# Patient Record
Sex: Female | Born: 1960 | Race: White | Hispanic: No | Marital: Single | Smoking: Current every day smoker
Health system: Southern US, Community
[De-identification: ages and names within clinical notes are randomized; demographics above are authoritative.]

## PROBLEM LIST (undated history)

## (undated) DIAGNOSIS — I2510 Atherosclerotic heart disease of native coronary artery without angina pectoris: Secondary | ICD-10-CM

## (undated) DIAGNOSIS — I255 Ischemic cardiomyopathy: Secondary | ICD-10-CM

## (undated) DIAGNOSIS — F319 Bipolar disorder, unspecified: Secondary | ICD-10-CM

## (undated) DIAGNOSIS — I10 Essential (primary) hypertension: Secondary | ICD-10-CM

## (undated) DIAGNOSIS — J020 Streptococcal pharyngitis: Secondary | ICD-10-CM

## (undated) DIAGNOSIS — N393 Stress incontinence (female) (male): Secondary | ICD-10-CM

## (undated) DIAGNOSIS — N390 Urinary tract infection, site not specified: Secondary | ICD-10-CM

## (undated) DIAGNOSIS — L309 Dermatitis, unspecified: Secondary | ICD-10-CM

## (undated) DIAGNOSIS — Z720 Tobacco use: Secondary | ICD-10-CM

## (undated) DIAGNOSIS — D219 Benign neoplasm of connective and other soft tissue, unspecified: Secondary | ICD-10-CM

## (undated) DIAGNOSIS — J449 Chronic obstructive pulmonary disease, unspecified: Secondary | ICD-10-CM

## (undated) HISTORY — DX: Atherosclerotic heart disease of native coronary artery without angina pectoris: I25.10

## (undated) HISTORY — DX: Bipolar disorder, unspecified: F31.9

## (undated) HISTORY — DX: Dermatitis, unspecified: L30.9

## (undated) HISTORY — PX: CORONARY STENT PLACEMENT: SHX1402

## (undated) HISTORY — DX: Tobacco use: Z72.0

## (undated) HISTORY — DX: Benign neoplasm of connective and other soft tissue, unspecified: D21.9

## (undated) HISTORY — DX: Chronic obstructive pulmonary disease, unspecified: J44.9

## (undated) HISTORY — DX: Essential (primary) hypertension: I10

## (undated) HISTORY — DX: Stress incontinence (female) (male): N39.3

## (undated) HISTORY — DX: Urinary tract infection, site not specified: N39.0

## (undated) HISTORY — PX: APPENDECTOMY: SHX54

## (undated) HISTORY — DX: Ischemic cardiomyopathy: I25.5

## (undated) HISTORY — PX: TUBAL LIGATION: SHX77

---
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
## 2000-07-19 ENCOUNTER — Other Ambulatory Visit: Admission: RE | Admit: 2000-07-19 | Discharge: 2000-07-19 | Payer: Self-pay | Admitting: Obstetrics and Gynecology

## 2000-09-24 ENCOUNTER — Emergency Department (HOSPITAL_COMMUNITY): Admission: EM | Admit: 2000-09-24 | Discharge: 2000-09-24 | Payer: Self-pay | Admitting: Emergency Medicine

## 2000-10-01 ENCOUNTER — Emergency Department (HOSPITAL_COMMUNITY): Admission: EM | Admit: 2000-10-01 | Discharge: 2000-10-01 | Payer: Self-pay | Admitting: *Deleted

## 2001-03-11 ENCOUNTER — Ambulatory Visit (HOSPITAL_COMMUNITY): Admission: RE | Admit: 2001-03-11 | Discharge: 2001-03-11 | Payer: Self-pay | Admitting: Family Medicine

## 2001-03-11 ENCOUNTER — Encounter: Payer: Self-pay | Admitting: Family Medicine

## 2002-03-11 ENCOUNTER — Emergency Department (HOSPITAL_COMMUNITY): Admission: EM | Admit: 2002-03-11 | Discharge: 2002-03-11 | Payer: Self-pay | Admitting: *Deleted

## 2002-04-06 ENCOUNTER — Emergency Department (HOSPITAL_COMMUNITY): Admission: EM | Admit: 2002-04-06 | Discharge: 2002-04-06 | Payer: Self-pay | Admitting: Emergency Medicine

## 2003-02-12 ENCOUNTER — Emergency Department (HOSPITAL_COMMUNITY): Admission: EM | Admit: 2003-02-12 | Discharge: 2003-02-12 | Payer: Self-pay | Admitting: Emergency Medicine

## 2003-08-09 ENCOUNTER — Emergency Department (HOSPITAL_COMMUNITY): Admission: EM | Admit: 2003-08-09 | Discharge: 2003-08-09 | Payer: Self-pay | Admitting: Emergency Medicine

## 2004-07-17 ENCOUNTER — Emergency Department (HOSPITAL_COMMUNITY): Admission: EM | Admit: 2004-07-17 | Discharge: 2004-07-17 | Payer: Self-pay | Admitting: Emergency Medicine

## 2004-08-06 ENCOUNTER — Emergency Department (HOSPITAL_COMMUNITY): Admission: EM | Admit: 2004-08-06 | Discharge: 2004-08-06 | Payer: Self-pay | Admitting: Emergency Medicine

## 2005-02-05 ENCOUNTER — Encounter (INDEPENDENT_AMBULATORY_CARE_PROVIDER_SITE_OTHER): Payer: Self-pay | Admitting: Internal Medicine

## 2005-07-20 ENCOUNTER — Emergency Department (HOSPITAL_COMMUNITY): Admission: EM | Admit: 2005-07-20 | Discharge: 2005-07-20 | Payer: Self-pay | Admitting: Internal Medicine

## 2005-08-24 ENCOUNTER — Emergency Department (HOSPITAL_COMMUNITY): Admission: EM | Admit: 2005-08-24 | Discharge: 2005-08-24 | Payer: Self-pay | Admitting: Emergency Medicine

## 2005-11-16 ENCOUNTER — Ambulatory Visit: Payer: Self-pay | Admitting: Internal Medicine

## 2005-11-26 ENCOUNTER — Ambulatory Visit: Payer: Self-pay | Admitting: Family Medicine

## 2005-11-30 ENCOUNTER — Ambulatory Visit: Payer: Self-pay | Admitting: *Deleted

## 2005-12-11 ENCOUNTER — Ambulatory Visit: Payer: Self-pay | Admitting: Internal Medicine

## 2006-03-20 ENCOUNTER — Ambulatory Visit: Payer: Self-pay | Admitting: Family Medicine

## 2006-04-25 ENCOUNTER — Ambulatory Visit: Payer: Self-pay | Admitting: Internal Medicine

## 2006-05-07 ENCOUNTER — Ambulatory Visit: Payer: Self-pay | Admitting: Internal Medicine

## 2006-05-23 ENCOUNTER — Ambulatory Visit: Payer: Self-pay | Admitting: Internal Medicine

## 2006-09-03 ENCOUNTER — Ambulatory Visit: Payer: Self-pay | Admitting: Internal Medicine

## 2006-09-03 LAB — CONVERTED CEMR LAB
Cholesterol: 153 mg/dL (ref 0–200)
HDL: 31 mg/dL — ABNORMAL LOW (ref >39)
Total CHOL/HDL Ratio: 4.9
VLDL: 49 mg/dL — ABNORMAL HIGH (ref 0–40)

## 2006-09-05 ENCOUNTER — Ambulatory Visit (HOSPITAL_COMMUNITY): Admission: RE | Admit: 2006-09-05 | Discharge: 2006-09-05 | Payer: Self-pay | Admitting: Internal Medicine

## 2006-09-10 ENCOUNTER — Ambulatory Visit (HOSPITAL_COMMUNITY): Admission: RE | Admit: 2006-09-10 | Discharge: 2006-09-10 | Payer: Self-pay | Admitting: Family Medicine

## 2006-09-17 ENCOUNTER — Encounter (INDEPENDENT_AMBULATORY_CARE_PROVIDER_SITE_OTHER): Payer: Self-pay | Admitting: Internal Medicine

## 2006-09-17 DIAGNOSIS — F3130 Bipolar disorder, current episode depressed, mild or moderate severity, unspecified: Secondary | ICD-10-CM | POA: Insufficient documentation

## 2006-10-21 ENCOUNTER — Telehealth (INDEPENDENT_AMBULATORY_CARE_PROVIDER_SITE_OTHER): Payer: Self-pay | Admitting: Internal Medicine

## 2006-10-23 ENCOUNTER — Encounter (INDEPENDENT_AMBULATORY_CARE_PROVIDER_SITE_OTHER): Payer: Self-pay | Admitting: *Deleted

## 2007-01-21 ENCOUNTER — Telehealth (INDEPENDENT_AMBULATORY_CARE_PROVIDER_SITE_OTHER): Payer: Self-pay | Admitting: Internal Medicine

## 2007-04-25 ENCOUNTER — Ambulatory Visit: Payer: Self-pay | Admitting: Internal Medicine

## 2007-04-25 DIAGNOSIS — F17200 Nicotine dependence, unspecified, uncomplicated: Secondary | ICD-10-CM | POA: Insufficient documentation

## 2007-04-25 DIAGNOSIS — J309 Allergic rhinitis, unspecified: Secondary | ICD-10-CM | POA: Insufficient documentation

## 2007-04-25 DIAGNOSIS — J40 Bronchitis, not specified as acute or chronic: Secondary | ICD-10-CM | POA: Insufficient documentation

## 2007-06-05 ENCOUNTER — Ambulatory Visit: Payer: Self-pay | Admitting: Internal Medicine

## 2007-06-12 ENCOUNTER — Encounter (INDEPENDENT_AMBULATORY_CARE_PROVIDER_SITE_OTHER): Payer: Self-pay | Admitting: Internal Medicine

## 2007-09-11 ENCOUNTER — Emergency Department (HOSPITAL_COMMUNITY): Admission: EM | Admit: 2007-09-11 | Discharge: 2007-09-11 | Payer: Self-pay | Admitting: Emergency Medicine

## 2007-09-11 DIAGNOSIS — J189 Pneumonia, unspecified organism: Secondary | ICD-10-CM | POA: Insufficient documentation

## 2007-09-11 DIAGNOSIS — J438 Other emphysema: Secondary | ICD-10-CM | POA: Insufficient documentation

## 2007-09-18 ENCOUNTER — Ambulatory Visit: Payer: Self-pay | Admitting: Nurse Practitioner

## 2007-09-18 DIAGNOSIS — M25519 Pain in unspecified shoulder: Secondary | ICD-10-CM | POA: Insufficient documentation

## 2007-10-30 ENCOUNTER — Ambulatory Visit: Payer: Self-pay | Admitting: Internal Medicine

## 2007-10-30 DIAGNOSIS — G5600 Carpal tunnel syndrome, unspecified upper limb: Secondary | ICD-10-CM | POA: Insufficient documentation

## 2007-11-14 ENCOUNTER — Ambulatory Visit (HOSPITAL_COMMUNITY): Admission: RE | Admit: 2007-11-14 | Discharge: 2007-11-14 | Payer: Self-pay | Admitting: Internal Medicine

## 2007-11-21 ENCOUNTER — Telehealth (INDEPENDENT_AMBULATORY_CARE_PROVIDER_SITE_OTHER): Payer: Self-pay | Admitting: Internal Medicine

## 2007-11-30 ENCOUNTER — Encounter (INDEPENDENT_AMBULATORY_CARE_PROVIDER_SITE_OTHER): Payer: Self-pay | Admitting: Internal Medicine

## 2008-01-16 ENCOUNTER — Telehealth (INDEPENDENT_AMBULATORY_CARE_PROVIDER_SITE_OTHER): Payer: Self-pay | Admitting: Internal Medicine

## 2008-02-05 ENCOUNTER — Ambulatory Visit: Payer: Self-pay | Admitting: Internal Medicine

## 2008-02-05 DIAGNOSIS — J0100 Acute maxillary sinusitis, unspecified: Secondary | ICD-10-CM | POA: Insufficient documentation

## 2008-03-01 ENCOUNTER — Telehealth (INDEPENDENT_AMBULATORY_CARE_PROVIDER_SITE_OTHER): Payer: Self-pay | Admitting: Internal Medicine

## 2008-03-04 ENCOUNTER — Ambulatory Visit: Payer: Self-pay | Admitting: Internal Medicine

## 2008-03-04 DIAGNOSIS — R32 Unspecified urinary incontinence: Secondary | ICD-10-CM | POA: Insufficient documentation

## 2008-03-04 LAB — CONVERTED CEMR LAB
Blood in Urine, dipstick: NEGATIVE
Nitrite: NEGATIVE
Protein, U semiquant: NEGATIVE
WBC Urine, dipstick: NEGATIVE

## 2008-04-05 DIAGNOSIS — F339 Major depressive disorder, recurrent, unspecified: Secondary | ICD-10-CM | POA: Insufficient documentation

## 2008-04-14 ENCOUNTER — Telehealth (INDEPENDENT_AMBULATORY_CARE_PROVIDER_SITE_OTHER): Payer: Self-pay | Admitting: Internal Medicine

## 2008-04-23 ENCOUNTER — Encounter (INDEPENDENT_AMBULATORY_CARE_PROVIDER_SITE_OTHER): Payer: Self-pay | Admitting: *Deleted

## 2008-08-24 ENCOUNTER — Ambulatory Visit (HOSPITAL_COMMUNITY): Admission: RE | Admit: 2008-08-24 | Discharge: 2008-08-24 | Payer: Self-pay | Admitting: Family Medicine

## 2008-08-31 ENCOUNTER — Encounter (INDEPENDENT_AMBULATORY_CARE_PROVIDER_SITE_OTHER): Payer: Self-pay | Admitting: Internal Medicine

## 2008-10-05 ENCOUNTER — Inpatient Hospital Stay (HOSPITAL_COMMUNITY): Admission: RE | Admit: 2008-10-05 | Discharge: 2008-10-08 | Payer: Self-pay | Admitting: Cardiovascular Disease

## 2008-10-05 ENCOUNTER — Encounter: Payer: Self-pay | Admitting: Emergency Medicine

## 2008-10-09 ENCOUNTER — Emergency Department (HOSPITAL_COMMUNITY): Admission: EM | Admit: 2008-10-09 | Discharge: 2008-10-09 | Payer: Self-pay | Admitting: Emergency Medicine

## 2008-10-25 ENCOUNTER — Encounter (HOSPITAL_COMMUNITY): Admission: RE | Admit: 2008-10-25 | Discharge: 2008-11-03 | Payer: Self-pay | Admitting: Family Medicine

## 2008-11-05 ENCOUNTER — Encounter (HOSPITAL_COMMUNITY): Admission: RE | Admit: 2008-11-05 | Discharge: 2008-12-05 | Payer: Self-pay | Admitting: Cardiovascular Disease

## 2008-11-15 ENCOUNTER — Encounter (INDEPENDENT_AMBULATORY_CARE_PROVIDER_SITE_OTHER): Payer: Self-pay | Admitting: Internal Medicine

## 2008-12-08 ENCOUNTER — Ambulatory Visit (HOSPITAL_COMMUNITY): Admission: RE | Admit: 2008-12-08 | Discharge: 2008-12-08 | Payer: Self-pay | Admitting: Family Medicine

## 2008-12-08 LAB — HM MAMMOGRAPHY: HM Mammogram: NORMAL

## 2008-12-13 ENCOUNTER — Encounter (INDEPENDENT_AMBULATORY_CARE_PROVIDER_SITE_OTHER): Payer: Self-pay | Admitting: Internal Medicine

## 2009-05-26 ENCOUNTER — Emergency Department (HOSPITAL_COMMUNITY): Admission: EM | Admit: 2009-05-26 | Discharge: 2009-05-27 | Payer: Self-pay | Admitting: Emergency Medicine

## 2009-10-09 ENCOUNTER — Emergency Department (HOSPITAL_COMMUNITY): Admission: EM | Admit: 2009-10-09 | Discharge: 2009-10-09 | Payer: Self-pay | Admitting: Emergency Medicine

## 2010-02-26 ENCOUNTER — Encounter: Payer: Self-pay | Admitting: Family Medicine

## 2010-02-26 ENCOUNTER — Encounter: Payer: Self-pay | Admitting: Occupational Therapy

## 2010-03-24 ENCOUNTER — Emergency Department (HOSPITAL_COMMUNITY)
Admission: EM | Admit: 2010-03-24 | Discharge: 2010-03-25 | Disposition: A | Discharge status: Home / Self Care | Payer: Self-pay | Attending: Emergency Medicine | Admitting: Emergency Medicine

## 2010-03-24 ENCOUNTER — Emergency Department (HOSPITAL_COMMUNITY): Payer: Self-pay

## 2010-03-24 DIAGNOSIS — R05 Cough: Secondary | ICD-10-CM | POA: Insufficient documentation

## 2010-03-24 DIAGNOSIS — M129 Arthropathy, unspecified: Secondary | ICD-10-CM | POA: Insufficient documentation

## 2010-03-24 DIAGNOSIS — F3289 Other specified depressive episodes: Secondary | ICD-10-CM | POA: Insufficient documentation

## 2010-03-24 DIAGNOSIS — I2510 Atherosclerotic heart disease of native coronary artery without angina pectoris: Secondary | ICD-10-CM | POA: Insufficient documentation

## 2010-03-24 DIAGNOSIS — R059 Cough, unspecified: Secondary | ICD-10-CM | POA: Insufficient documentation

## 2010-03-24 DIAGNOSIS — Z9861 Coronary angioplasty status: Secondary | ICD-10-CM | POA: Insufficient documentation

## 2010-03-24 DIAGNOSIS — Z79899 Other long term (current) drug therapy: Secondary | ICD-10-CM | POA: Insufficient documentation

## 2010-03-24 DIAGNOSIS — J45909 Unspecified asthma, uncomplicated: Secondary | ICD-10-CM | POA: Insufficient documentation

## 2010-03-24 DIAGNOSIS — F329 Major depressive disorder, single episode, unspecified: Secondary | ICD-10-CM | POA: Insufficient documentation

## 2010-03-24 DIAGNOSIS — R0602 Shortness of breath: Secondary | ICD-10-CM | POA: Insufficient documentation

## 2010-03-24 DIAGNOSIS — F17200 Nicotine dependence, unspecified, uncomplicated: Secondary | ICD-10-CM | POA: Insufficient documentation

## 2010-04-25 LAB — DIFFERENTIAL
Eosinophils Absolute: 0.1 10*3/uL (ref 0.0–0.7)
Eosinophils Relative: 2 % (ref 0–5)
Lymphs Abs: 1.6 10*3/uL (ref 0.7–4.0)
Monocytes Relative: 8 % (ref 3–12)

## 2010-04-25 LAB — URINALYSIS, ROUTINE W REFLEX MICROSCOPIC
Bilirubin Urine: NEGATIVE
Glucose, UA: NEGATIVE mg/dL
Hgb urine dipstick: NEGATIVE
Ketones, ur: NEGATIVE mg/dL
Protein, ur: NEGATIVE mg/dL

## 2010-04-25 LAB — COMPREHENSIVE METABOLIC PANEL
ALT: 9 U/L (ref 0–35)
AST: 10 U/L (ref 0–37)
CO2: 29 mEq/L (ref 19–32)
Calcium: 8.8 mg/dL (ref 8.4–10.5)
Chloride: 107 mEq/L (ref 96–112)
GFR calc Af Amer: >60 mL/min (ref >60)
GFR calc non Af Amer: >60 mL/min (ref >60)
Sodium: 138 mEq/L (ref 135–145)

## 2010-04-25 LAB — CBC
MCHC: 36.3 g/dL — ABNORMAL HIGH (ref 30.0–36.0)
RBC: 3.5 MIL/uL — ABNORMAL LOW (ref 3.87–5.11)
WBC: 5.9 10*3/uL (ref 4.0–10.5)

## 2010-04-25 LAB — WET PREP, GENITAL: Trich, Wet Prep: NONE SEEN

## 2010-04-25 LAB — LIPASE, BLOOD: Lipase: 40 U/L (ref 11–59)

## 2010-05-12 LAB — CBC
HCT: 34.2 % — ABNORMAL LOW (ref 36.0–46.0)
HCT: 36.6 % (ref 36.0–46.0)
Hemoglobin: 12.7 g/dL (ref 12.0–15.0)
MCHC: 35.4 g/dL (ref 30.0–36.0)
MCHC: 34.5 g/dL (ref 30.0–36.0)
MCV: 97.9 fL (ref 78.0–100.0)
MCV: 98.4 fL (ref 78.0–100.0)
Platelets: 261 10*3/uL (ref 150–400)
RBC: 3.72 MIL/uL — ABNORMAL LOW (ref 3.87–5.11)
RDW: 13.8 % (ref 11.5–15.5)

## 2010-05-12 LAB — COMPREHENSIVE METABOLIC PANEL
Albumin: 3.4 g/dL — ABNORMAL LOW (ref 3.5–5.2)
BUN: 6 mg/dL (ref 6–23)
Calcium: 8.8 mg/dL (ref 8.4–10.5)
Creatinine, Ser: 0.75 mg/dL (ref 0.4–1.2)
Total Protein: 6.4 g/dL (ref 6.0–8.3)

## 2010-05-12 LAB — POCT CARDIAC MARKERS
CKMB, poc: 1.7 ng/mL (ref 1.0–8.0)
CKMB, poc: 2.3 ng/mL (ref 1.0–8.0)
Myoglobin, poc: 159 ng/mL (ref 12–200)
Myoglobin, poc: 69.9 ng/mL (ref 12–200)
Troponin i, poc: 0.14 ng/mL — ABNORMAL HIGH (ref 0.00–0.09)

## 2010-05-12 LAB — CK TOTAL AND CKMB (NOT AT ARMC)
CK, MB: 16.2 ng/mL — ABNORMAL HIGH (ref 0.3–4.0)
CK, MB: 18.1 ng/mL — ABNORMAL HIGH (ref 0.3–4.0)
Relative Index: 7.3 — ABNORMAL HIGH (ref 0.0–2.5)
Total CK: 249 U/L — ABNORMAL HIGH (ref 7–177)

## 2010-05-12 LAB — BASIC METABOLIC PANEL
CO2: 25 mEq/L (ref 19–32)
Calcium: 8.3 mg/dL — ABNORMAL LOW (ref 8.4–10.5)
Chloride: 105 mEq/L (ref 96–112)
GFR calc Af Amer: >60 mL/min (ref >60)
Glucose, Bld: 121 mg/dL — ABNORMAL HIGH (ref 70–99)
Potassium: 3.6 mEq/L (ref 3.5–5.1)
Sodium: 135 mEq/L (ref 135–145)

## 2010-05-12 LAB — DIFFERENTIAL
Lymphocytes Relative: 15 % (ref 12–46)
Monocytes Absolute: 0.6 10*3/uL (ref 0.1–1.0)
Monocytes Relative: 9 % (ref 3–12)
Neutro Abs: 5.4 10*3/uL (ref 1.7–7.7)

## 2010-05-13 LAB — CBC
MCHC: 34.7 g/dL (ref 30.0–36.0)
RBC: 4.07 MIL/uL (ref 3.87–5.11)
RDW: 14.2 % (ref 11.5–15.5)

## 2010-05-13 LAB — POCT CARDIAC MARKERS: Myoglobin, poc: 18.4 ng/mL (ref 12–200)

## 2010-05-13 LAB — BASIC METABOLIC PANEL
CO2: 25 mEq/L (ref 19–32)
Calcium: 9.6 mg/dL (ref 8.4–10.5)
Creatinine, Ser: 0.81 mg/dL (ref 0.4–1.2)
GFR calc Af Amer: >60 mL/min (ref >60)
GFR calc non Af Amer: >60 mL/min (ref >60)

## 2010-06-20 NOTE — Cardiovascular Report (Signed)
NAMEMARIETA, Melissa Garcia NO.:  [id number]   MEDICAL RECORD NO.:  [id number]          PATIENT TYPE:  INP   LOCATION:  2909                         FACILITY:  [facility]   PHYSICIAN:  Vesta Mixer, M.D. DATE OF BIRTH:  November 23, 1960   DATE OF PROCEDURE:  10/05/2008  DATE OF DISCHARGE:                            CARDIAC CATHETERIZATION   Melissa Garcia is a 50 year old female with no significant past medical  history.  She presented to the [facility] Emergency Room with episodes  of chest discomfort.  Her pain started around 3:30 this afternoon.  She  arrived at the emergency room around 5.  She was found to have acute ST-  segment elevation in the anterior leads and transfer to [facility] was  arranged.  She developed ventricular fibrillation and was defibrillated  twice.  She promptly resumed sinus rhythm and she immediately felt quite  a bit better.  She did have some nausea, which was treated with Zofran.   She was transferred to [facility] for urgent/emergent cath and  angioplasty.   The procedure was left heart catheterization with coronary angiography.  We also performed PTCA and stenting of the mid left anterior descending  artery and PTCA of the second diagonal branch.   The procedure was left heart catheterization with coronary angiography  with PTCA and stenting of the LAD.  The right femoral artery was easily  cannulated using modified Seldinger technique.   HEMODYNAMICS:  LV pressure is 112/70.  Aortic pressure was 100/64.   Angiography of left main.  The left main has no significant  irregularities.   The left anterior descending artery has mild-to-moderate irregularities  throughout.  The mid vessel has an 80% irregular stenosis right at the  second diagonal vessel.  It is assumed that this was the site of the  occlusion.  I suspect that the defibrillation broke up the thrombus  resulting in an open artery at that point.   The LAD courses around the  apex and supplies the inferoapical wall.   The first diagonal artery is a very small vessel.  The second diagonal  artery originates at the site of the stenosis and has a 90% stenosis at  its takeoff.   The left circumflex artery is a relatively small vessel and it has minor  luminal irregularities.  The ramus intermediate vessel is a very large  vessel and is essentially normal.   The right coronary artery is normal.  There are mild-to-moderate  irregularities between 20% and 30% throughout its course.  There is a  large conus branch.   Left ventriculogram was performed in the 30-RAO position.  It reveals  anterior, apical, and inferoapical akinesis.  The ejection fraction is  around 30%.   There is no significant mitral regurgitation.   PCI.  A Judkins left 4 guide (6-French) was placed up into the vessel.  Angiomax was given.  The ACT was 352.   Prowater angioplasty wire was positioned down into the distal left  anterior descending artery.  Predilatation was achieved using a 2.5 x 15-  mm apex balloon.  We inflated it up to 6 atmospheres for 18 seconds and  then 6 atmospheres for 14 seconds.  A second wire was positioned down  into the second diagonal artery to protect it during the stent  procedure.  At this point, a 2.75 x 23-mm Xience stent was positioned  across the stenosis.  It was deployed at 10 atmospheres for 31 seconds.   At this point, the diagonal artery wire was removed.   Poststent dilatation was achieved using a 3.0 x 20 mm noncompliant  Voyager.  Position in the distal aspect of the stent was inflated up to  12 atmospheres for 18 seconds.  It was then pulled back to the proximal  edge of the stent, inflated up to 14 atmospheres for 16 seconds.  This  gave [place] a very nice angiographic result.  There was some additional  compromise of the diagonal flows.  The second diagonal was rewired.  A  1.5 mm x 12-mm apex was positioned down across the stent strut out  into  the diagonal.  It was inflated up to 6 atmospheres for 23 seconds and  then the second inflation up to 8 atmospheres for 20 seconds.  This gave  [place] some improved flow.  There was still a tight stenosis in the diagonal  vessel, but there was TIMI grade 3 flow.   At this time, it was decided to stop the angioplasty.  We had a nice  open left anterior descending artery.  The second diagonal artery has  good flow, but it has residual stenosis.  This stenosis appears to be in  the muscular area of the LAD and it is unlikely that it will improve  with further inflations.   COMPLICATIONS:  None.   CONCLUSION:  1. Successful PTCA and stenting of the left anterior descending artery      for an anterior wall myocardial infarction.  2. Markedly reduced left ventricular systolic function with an      ejection fraction of around 30%.  She will need to be started on      ACE inhibitor and beta-blockers.  We will also give her some Lasix.      A Foley was also placed in the cath lab.      Vesta Mixer, M.D.  Electronically Signed     PJN/MEDQ  D:  10/05/2008  T:  10/06/2008  Job:  [id number]

## 2010-08-02 ENCOUNTER — Emergency Department (HOSPITAL_COMMUNITY)
Admission: EM | Admit: 2010-08-02 | Discharge: 2010-08-03 | Disposition: A | Discharge status: Home / Self Care | Payer: Self-pay | Attending: Emergency Medicine | Admitting: Emergency Medicine

## 2010-08-02 DIAGNOSIS — J45909 Unspecified asthma, uncomplicated: Secondary | ICD-10-CM | POA: Insufficient documentation

## 2010-08-02 DIAGNOSIS — H53149 Visual discomfort, unspecified: Secondary | ICD-10-CM | POA: Insufficient documentation

## 2010-08-02 DIAGNOSIS — I2510 Atherosclerotic heart disease of native coronary artery without angina pectoris: Secondary | ICD-10-CM | POA: Insufficient documentation

## 2010-08-02 DIAGNOSIS — R109 Unspecified abdominal pain: Secondary | ICD-10-CM | POA: Insufficient documentation

## 2010-08-02 DIAGNOSIS — R112 Nausea with vomiting, unspecified: Secondary | ICD-10-CM | POA: Insufficient documentation

## 2010-08-02 DIAGNOSIS — F329 Major depressive disorder, single episode, unspecified: Secondary | ICD-10-CM | POA: Insufficient documentation

## 2010-08-02 DIAGNOSIS — F3289 Other specified depressive episodes: Secondary | ICD-10-CM | POA: Insufficient documentation

## 2010-08-02 DIAGNOSIS — J438 Other emphysema: Secondary | ICD-10-CM | POA: Insufficient documentation

## 2010-08-02 DIAGNOSIS — Z7902 Long term (current) use of antithrombotics/antiplatelets: Secondary | ICD-10-CM | POA: Insufficient documentation

## 2010-08-02 DIAGNOSIS — R51 Headache: Secondary | ICD-10-CM | POA: Insufficient documentation

## 2010-08-02 DIAGNOSIS — Z9861 Coronary angioplasty status: Secondary | ICD-10-CM | POA: Insufficient documentation

## 2010-08-02 DIAGNOSIS — Z7982 Long term (current) use of aspirin: Secondary | ICD-10-CM | POA: Insufficient documentation

## 2010-08-02 DIAGNOSIS — E7800 Pure hypercholesterolemia, unspecified: Secondary | ICD-10-CM | POA: Insufficient documentation

## 2010-08-02 DIAGNOSIS — Z79899 Other long term (current) drug therapy: Secondary | ICD-10-CM | POA: Insufficient documentation

## 2010-09-14 IMAGING — CR DG CHEST 1V PORT
1 series · 1 of 1 positions shown · non-contrast
Comparison: 09/11/2007

CLINICAL DATA: Chest pain and shortness of breath.

PORTABLE CHEST - 1 VIEW

[view not recorded]
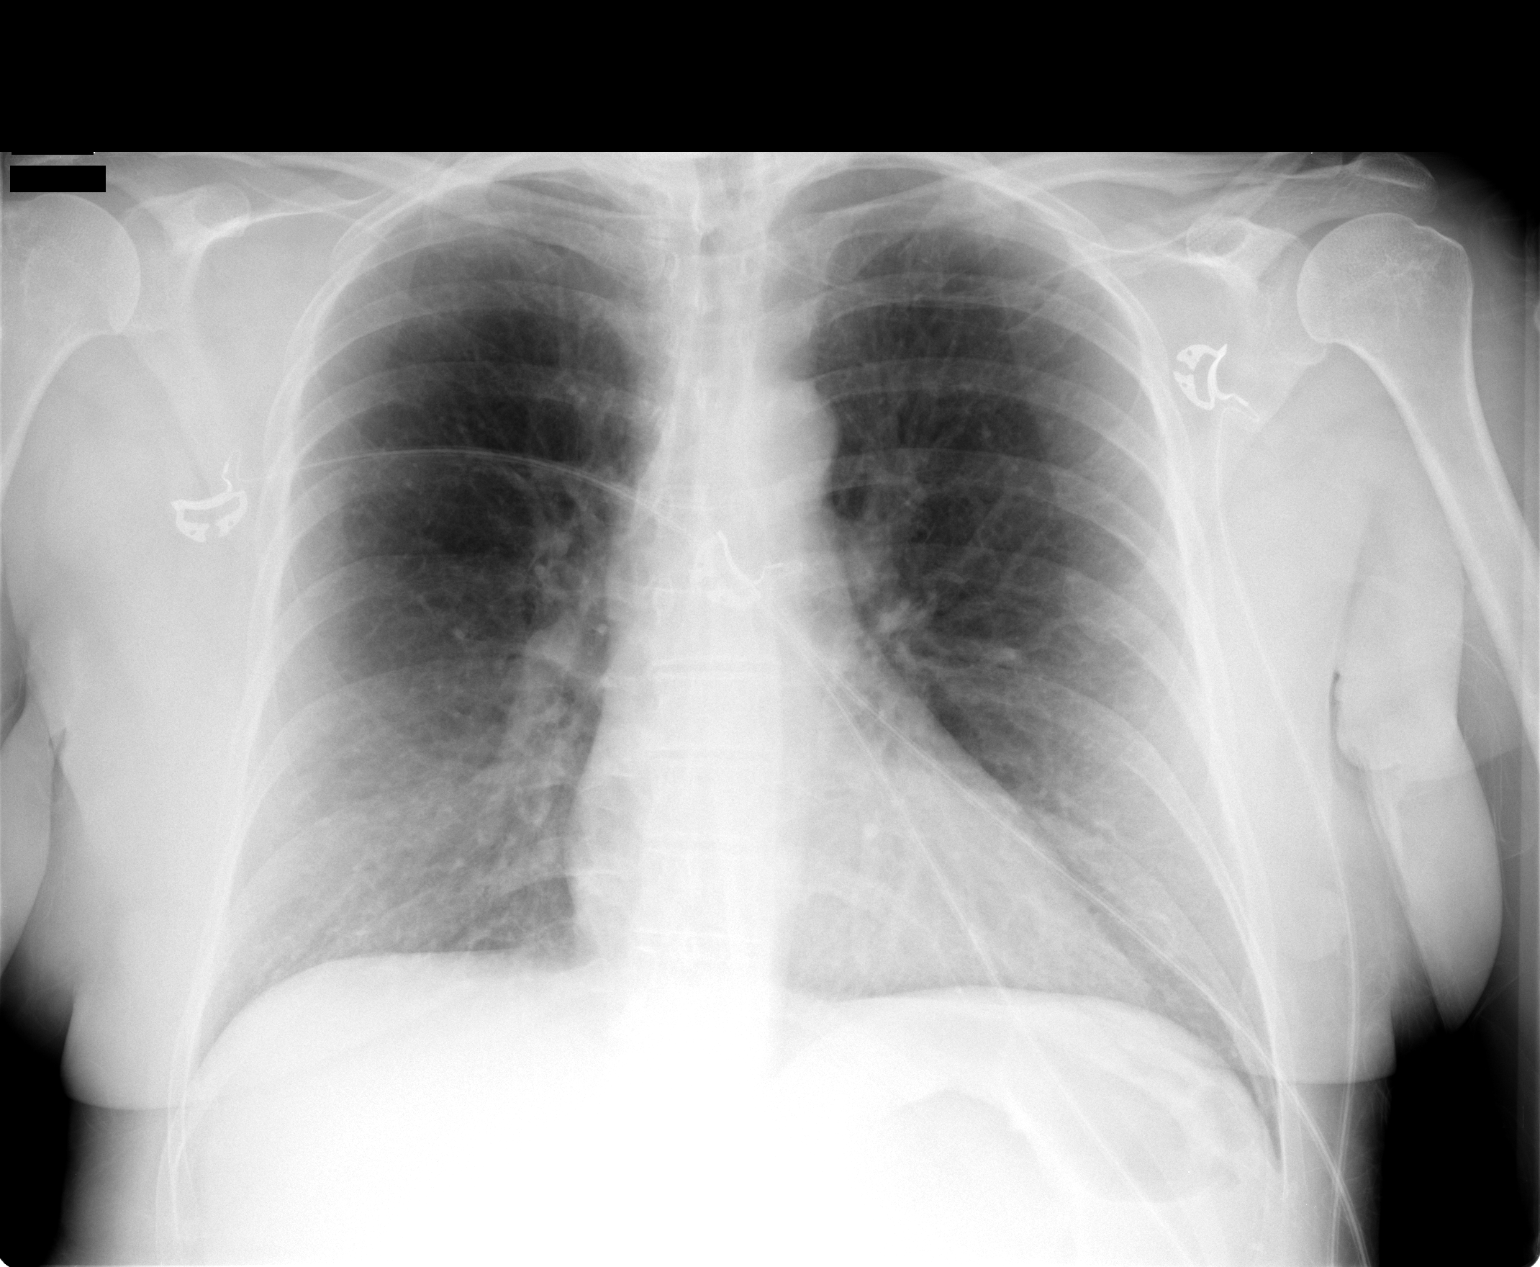

[1 of 1 positions shown; findings below may reference images not displayed]

FINDINGS: Stable chronic interstitial lung disease present.  No
evidence of edema, focal infiltrate, pneumothorax or pleural
effusion.  Heart size is normal.  The bony thorax is unremarkable.
IMPRESSION: No acute findings.  Stable chronic interstitial prominence.

## 2010-11-01 ENCOUNTER — Emergency Department (HOSPITAL_COMMUNITY): Payer: Medicaid Other

## 2010-11-01 ENCOUNTER — Other Ambulatory Visit: Payer: Self-pay

## 2010-11-01 ENCOUNTER — Inpatient Hospital Stay (HOSPITAL_COMMUNITY)
Admission: EM | Admit: 2010-11-01 | Discharge: 2010-11-02 | DRG: 191 | Disposition: A | Discharge status: Home / Self Care | Payer: Medicaid Other | Attending: Internal Medicine | Admitting: Internal Medicine

## 2010-11-01 ENCOUNTER — Encounter: Payer: Self-pay | Admitting: *Deleted

## 2010-11-01 DIAGNOSIS — J189 Pneumonia, unspecified organism: Secondary | ICD-10-CM

## 2010-11-01 DIAGNOSIS — M25519 Pain in unspecified shoulder: Secondary | ICD-10-CM

## 2010-11-01 DIAGNOSIS — I2510 Atherosclerotic heart disease of native coronary artery without angina pectoris: Secondary | ICD-10-CM | POA: Insufficient documentation

## 2010-11-01 DIAGNOSIS — Z9861 Coronary angioplasty status: Secondary | ICD-10-CM

## 2010-11-01 DIAGNOSIS — J40 Bronchitis, not specified as acute or chronic: Secondary | ICD-10-CM | POA: Diagnosis present

## 2010-11-01 DIAGNOSIS — J441 Chronic obstructive pulmonary disease with (acute) exacerbation: Principal | ICD-10-CM | POA: Diagnosis present

## 2010-11-01 DIAGNOSIS — Z9089 Acquired absence of other organs: Secondary | ICD-10-CM

## 2010-11-01 DIAGNOSIS — R0789 Other chest pain: Secondary | ICD-10-CM | POA: Diagnosis present

## 2010-11-01 DIAGNOSIS — J438 Other emphysema: Secondary | ICD-10-CM

## 2010-11-01 DIAGNOSIS — G5600 Carpal tunnel syndrome, unspecified upper limb: Secondary | ICD-10-CM

## 2010-11-01 DIAGNOSIS — F17200 Nicotine dependence, unspecified, uncomplicated: Secondary | ICD-10-CM | POA: Diagnosis present

## 2010-11-01 DIAGNOSIS — F339 Major depressive disorder, recurrent, unspecified: Secondary | ICD-10-CM

## 2010-11-01 DIAGNOSIS — F3130 Bipolar disorder, current episode depressed, mild or moderate severity, unspecified: Secondary | ICD-10-CM | POA: Diagnosis present

## 2010-11-01 DIAGNOSIS — J309 Allergic rhinitis, unspecified: Secondary | ICD-10-CM

## 2010-11-01 DIAGNOSIS — J0100 Acute maxillary sinusitis, unspecified: Secondary | ICD-10-CM

## 2010-11-01 DIAGNOSIS — R32 Unspecified urinary incontinence: Secondary | ICD-10-CM

## 2010-11-01 LAB — COMPREHENSIVE METABOLIC PANEL
AST: 12 U/L (ref 0–37)
Albumin: 3.8 g/dL (ref 3.5–5.2)
Calcium: 9.4 mg/dL (ref 8.4–10.5)
Creatinine, Ser: 0.67 mg/dL (ref 0.50–1.10)

## 2010-11-01 LAB — CBC
MCH: 33.9 pg (ref 26.0–34.0)
MCV: 98.2 fL (ref 78.0–100.0)
Platelets: 297 10*3/uL (ref 150–400)
RDW: 14 % (ref 11.5–15.5)
WBC: 5.5 10*3/uL (ref 4.0–10.5)

## 2010-11-01 LAB — CARDIAC PANEL(CRET KIN+CKTOT+MB+TROPI)
CK, MB: 2.2 ng/mL (ref 0.3–4.0)
CK, MB: 2.4 ng/mL (ref 0.3–4.0)
Relative Index: INVALID (ref 0.0–2.5)
Relative Index: INVALID (ref 0.0–2.5)
Total CK: 51 U/L (ref 7–177)
Total CK: 57 U/L (ref 7–177)

## 2010-11-01 MED ORDER — ALBUTEROL SULFATE (5 MG/ML) 0.5% IN NEBU: 2.5000 mg | INHALATION_SOLUTION | RESPIRATORY_TRACT | Status: DC | PRN

## 2010-11-01 MED ORDER — LAMOTRIGINE 100 MG PO TABS
100.0000 mg | ORAL_TABLET | Freq: Every day | ORAL | Status: DC
  Administered 2010-11-01 – 2010-11-02 (×2): 100 mg via ORAL
  Filled 2010-11-01 (×4): qty 1

## 2010-11-01 MED ORDER — METHYLPREDNISOLONE SODIUM SUCC 125 MG IJ SOLR
125.0000 mg | Freq: Once | INTRAMUSCULAR | Status: AC
  Administered 2010-11-01: 125 mg via INTRAVENOUS
  Filled 2010-11-01: qty 2

## 2010-11-01 MED ORDER — DEXTROSE 5 % IV SOLN
500.0000 mg | INTRAVENOUS | Status: DC
  Administered 2010-11-01: 500 mg via INTRAVENOUS
  Filled 2010-11-01: qty 500

## 2010-11-01 MED ORDER — AZITHROMYCIN 250 MG PO TABS
250.0000 mg | ORAL_TABLET | Freq: Every day | ORAL | Status: DC
  Administered 2010-11-02: 250 mg via ORAL
  Filled 2010-11-01: qty 1

## 2010-11-01 MED ORDER — ALUM & MAG HYDROXIDE-SIMETH 200-200-20 MG/5ML PO SUSP: 30.0000 mL | Freq: Four times a day (QID) | ORAL | Status: DC | PRN

## 2010-11-01 MED ORDER — CLONAZEPAM 0.5 MG PO TABS: 0.5000 mg | ORAL_TABLET | Freq: Two times a day (BID) | ORAL | Status: DC | PRN

## 2010-11-01 MED ORDER — ALBUTEROL SULFATE HFA 108 (90 BASE) MCG/ACT IN AERS: 1.0000 | INHALATION_SPRAY | Freq: Four times a day (QID) | RESPIRATORY_TRACT | Status: DC | PRN

## 2010-11-01 MED ORDER — PREDNISONE 10 MG PO TABS: 50.0000 mg | ORAL_TABLET | Freq: Every day | ORAL | Status: DC

## 2010-11-01 MED ORDER — ALBUTEROL SULFATE (5 MG/ML) 0.5% IN NEBU
2.5000 mg | INHALATION_SOLUTION | Freq: Four times a day (QID) | RESPIRATORY_TRACT | Status: DC
  Administered 2010-11-01 (×2): 2.5 mg via RESPIRATORY_TRACT
  Filled 2010-11-01 (×2): qty 0.5

## 2010-11-01 MED ORDER — ENOXAPARIN SODIUM 40 MG/0.4ML ~~LOC~~ SOLN
40.0000 mg | Freq: Every day | SUBCUTANEOUS | Status: DC
  Administered 2010-11-01 – 2010-11-02 (×2): 40 mg via SUBCUTANEOUS
  Filled 2010-11-01 (×2): qty 0.4

## 2010-11-01 MED ORDER — DOXYCYCLINE HYCLATE 100 MG PO CAPS: 100.0000 mg | ORAL_CAPSULE | Freq: Two times a day (BID) | ORAL | Status: DC

## 2010-11-01 MED ORDER — SODIUM CHLORIDE 0.9 % IN NEBU
INHALATION_SOLUTION | RESPIRATORY_TRACT | Status: AC
  Filled 2010-11-01: qty 3

## 2010-11-01 MED ORDER — ALBUTEROL SULFATE (5 MG/ML) 0.5% IN NEBU
2.5000 mg | INHALATION_SOLUTION | Freq: Four times a day (QID) | RESPIRATORY_TRACT | Status: DC
  Administered 2010-11-02 (×2): 2.5 mg via RESPIRATORY_TRACT
  Filled 2010-11-01 (×2): qty 0.5

## 2010-11-01 MED ORDER — NICOTINE 14 MG/24HR TD PT24
14.0000 mg | MEDICATED_PATCH | Freq: Every day | TRANSDERMAL | Status: DC
  Administered 2010-11-01 – 2010-11-02 (×2): 14 mg via TRANSDERMAL
  Filled 2010-11-01 (×2): qty 1

## 2010-11-01 MED ORDER — THERA M PLUS PO TABS
1.0000 | ORAL_TABLET | Freq: Every day | ORAL | Status: DC
  Administered 2010-11-01 – 2010-11-02 (×2): 1 via ORAL
  Filled 2010-11-01 (×2): qty 1

## 2010-11-01 MED ORDER — OMEGA-3-ACID ETHYL ESTERS 1 G PO CAPS
1.0000 g | ORAL_CAPSULE | Freq: Two times a day (BID) | ORAL | Status: DC
  Administered 2010-11-01 – 2010-11-02 (×3): 1 g via ORAL
  Filled 2010-11-01 (×3): qty 1

## 2010-11-01 MED ORDER — CLOPIDOGREL BISULFATE 75 MG PO TABS
75.0000 mg | ORAL_TABLET | Freq: Every day | ORAL | Status: DC
  Administered 2010-11-01 – 2010-11-02 (×2): 75 mg via ORAL
  Filled 2010-11-01 (×2): qty 1

## 2010-11-01 MED ORDER — OLMESARTAN MEDOXOMIL 20 MG PO TABS
10.0000 mg | ORAL_TABLET | Freq: Every day | ORAL | Status: DC
  Administered 2010-11-01 – 2010-11-02 (×2): 10 mg via ORAL
  Filled 2010-11-01 (×2): qty 1

## 2010-11-01 MED ORDER — AZITHROMYCIN 250 MG PO TABS: 250.0000 mg | ORAL_TABLET | Freq: Every day | ORAL | Status: DC

## 2010-11-01 MED ORDER — DEXTROSE 5 % IV SOLN
1.0000 g | Freq: Once | INTRAVENOUS | Status: AC
  Administered 2010-11-01: 1 g via INTRAVENOUS
  Filled 2010-11-01: qty 1

## 2010-11-01 MED ORDER — SODIUM CHLORIDE 0.9 % IJ SOLN: 3.0000 mL | INTRAMUSCULAR | Status: DC | PRN

## 2010-11-01 MED ORDER — ASPIRIN EC 81 MG PO TBEC
81.0000 mg | DELAYED_RELEASE_TABLET | Freq: Every day | ORAL | Status: DC
  Administered 2010-11-01 – 2010-11-02 (×2): 81 mg via ORAL
  Filled 2010-11-01 (×2): qty 1

## 2010-11-01 MED ORDER — SIMVASTATIN 10 MG PO TABS
10.0000 mg | ORAL_TABLET | Freq: Every day | ORAL | Status: DC
  Administered 2010-11-01: 10 mg via ORAL
  Filled 2010-11-01: qty 1

## 2010-11-01 MED ORDER — SODIUM CHLORIDE 0.9 % IJ SOLN
3.0000 mL | Freq: Two times a day (BID) | INTRAMUSCULAR | Status: DC
  Administered 2010-11-01 – 2010-11-02 (×3): 3 mL via INTRAVENOUS
  Filled 2010-11-01 (×3): qty 3

## 2010-11-01 MED ORDER — AZITHROMYCIN 250 MG PO TABS
500.0000 mg | ORAL_TABLET | Freq: Every day | ORAL | Status: AC
  Administered 2010-11-01: 500 mg via ORAL
  Filled 2010-11-01: qty 2

## 2010-11-01 MED ORDER — ALBUTEROL SULFATE (5 MG/ML) 0.5% IN NEBU
5.0000 mg | INHALATION_SOLUTION | RESPIRATORY_TRACT | Status: AC | PRN
  Administered 2010-11-01: 5 mg via RESPIRATORY_TRACT
  Filled 2010-11-01: qty 1

## 2010-11-01 NOTE — ED Notes (Signed)
Pt reports productive cough and chest wall pain x 2 weeks

## 2010-11-01 NOTE — ED Provider Notes (Signed)
History     CSN: [id number] Arrival date & time: 11/01/2010  1:31 AM  Chief Complaint  Patient presents with  . Cough    HPI  (Consider location/radiation/quality/duration/timing/severity/associated sxs/prior treatment)  Patient is a 50 y.o. female presenting with cough. The history is provided by the patient. No language interpreter was used.  Cough This is a new problem. The current episode started more than 1 week ago. The problem occurs constantly. The problem has not changed since onset.The cough is productive of sputum. There has been no fever. Associated symptoms include rhinorrhea, myalgias and wheezing. Pertinent negatives include no chest pain, no chills, no sweats, no weight loss, no ear pain, no headaches, no sore throat and no eye redness. She has tried nothing for the symptoms. The treatment provided no relief. She is a smoker. Her past medical history is significant for asthma.  No CP, No DOE, no n/v/d.  Some nasal congestion  Past Medical History  Diagnosis Date  . Coronary artery disease   . Asthma   . Myocardial infarct   . Hypertension     Past Surgical History  Procedure Date  . Appendectomy   . Coronary stent placement     No family history on file.  History  Substance Use Topics  . Smoking status: Current Some Day Smoker    Types: Cigarettes  . Smokeless tobacco: Not on file  . Alcohol Use: No    OB History    Grav Para Term Preterm Abortions TAB SAB Ect Mult Living                  Review of Systems  Review of Systems  Constitutional: Negative for chills, weight loss, diaphoresis and appetite change.  HENT: Positive for congestion, rhinorrhea and postnasal drip. Negative for ear pain, sore throat and facial swelling.   Eyes: Negative for discharge and redness.  Respiratory: Positive for cough and wheezing. Negative for chest tightness.   Cardiovascular: Negative for chest pain.  Gastrointestinal: Negative for abdominal distention.    Genitourinary: Negative for difficulty urinating and dyspareunia.  Musculoskeletal: Positive for myalgias. Negative for arthralgias.  Neurological: Negative for headaches.  Hematological: Negative.   Psychiatric/Behavioral: Negative.     Allergies  Sulfonamide derivatives  Home Medications   Current Outpatient Rx  Name Route Sig Dispense Refill  . ALBUTEROL SULFATE HFA 108 (90 BASE) MCG/ACT IN AERS Inhalation Inhale 2 puffs into the lungs every 6 (six) hours as needed.      . ASPIRIN 81 MG PO TABS Oral Take 81 mg by mouth daily.      Marland Kitchen CLONAZEPAM 0.5 MG PO TABS Oral Take 0.5 mg by mouth 2 (two) times daily as needed.      . CLOPIDOGREL BISULFATE 75 MG PO TABS Oral Take 75 mg by mouth daily.      Marland Kitchen LAMOTRIGINE 100 MG PO TABS Oral Take 100 mg by mouth daily.      Marland Kitchen PRAVASTATIN SODIUM 20 MG PO TABS Oral Take 20 mg by mouth daily.      Marland Kitchen VALSARTAN 80 MG PO TABS Oral Take 80 mg by mouth daily.        Physical Exam    BP 154/84  Pulse 74  Temp(Src) 97.7 F (36.5 C) (Oral)  Resp 18  Ht 5\' 3"  (1.6 m)  Wt 162 lb (73.483 kg)  BMI 28.70 kg/m2  SpO2 98%  Physical Exam  Constitutional: She is oriented to person, place, and time. She appears well-developed  and well-nourished. No distress.  HENT:  Head: Normocephalic and atraumatic.  Eyes: EOM are normal. Pupils are equal, round, and reactive to light. Right eye exhibits no discharge. Left eye exhibits no discharge.  Neck: Normal range of motion. Neck supple. No JVD present. No tracheal deviation present. Thyromegaly present.  Cardiovascular: Normal rate and regular rhythm.   No murmur heard. Pulmonary/Chest: Effort normal and breath sounds normal. No stridor. No respiratory distress. She has no wheezes.  Abdominal: Soft. Bowel sounds are normal. She exhibits no distension.  Musculoskeletal: Normal range of motion. She exhibits no edema.  Lymphadenopathy:    She has no cervical adenopathy.  Neurological: She is alert and oriented  to person, place, and time. She has normal reflexes.  Skin: Skin is warm and dry. She is not diaphoretic. No erythema.  Psychiatric: She has a normal mood and affect.    ED Course  Procedures (including critical care time)  Labs Reviewed - No data to display Dg Chest 2 View  11/01/2010  *RADIOLOGY REPORT*  Clinical Data: Cough, shortness of breath and chest soreness. History of smoking.  CHEST - 2 VIEW  Comparison: Chest radiograph performed 03/24/2010  Findings: The lungs are hyperexpanded, with flattening of the hemidiaphragms, compatible with COPD.  Mild chronic peribronchial thickening is noted.  Minimal bibasilar atelectasis is seen.  There is no evidence of pleural effusion or pneumothorax.  The heart is normal in size; the mediastinal contour is within normal limits.  No acute osseous abnormalities are seen.  IMPRESSION: Findings of COPD; minimal bibasilar atelectasis noted.  Lungs otherwise clear.  Original Report Authenticated By: Tonia Ghent, M.D.     No diagnosis found.   MDM PERC negative    Date: 11/01/2010  Rate: 73  Rhythm: normal sinus rhythm  QRS Axis: normal  Intervals: PR prolonged  ST/T Wave abnormalities: nonspecific ST changes  Conduction Disutrbances:first-degree A-V block   Narrative Interpretation: 1avb  Old EKG Reviewed: changes noted   Return for CP, SOB, DOE, n/v/d or worsening cough or any concerns.  Follow up with your family doctor in 2 days.  Patient verbalizes understanding and agrees to follow up  Shalisha Clausing Smitty Cords, MD 11/01/10 [phone]

## 2010-11-01 NOTE — ED Notes (Signed)
Pt resting quietly with eyes closed.  Resp regular, even and unlabored.

## 2010-11-01 NOTE — ED Notes (Signed)
Was informed that pt became very pale and weak when up to the bathroom.  Pt states that she felt her chest began to hurt and she couldn't catch her breath.  EDP made aware, and discharge canceled.  EKG completed and Orthostatic VS done.

## 2010-11-01 NOTE — ED Notes (Signed)
Pt resting quietly with eyes closed.  Resp regular, even and unlabored.  

## 2010-11-01 NOTE — H&P (Signed)
RHYLEE PUCILLO MRN: [id number] DOB/AGE: 05/11/1960 50 y.o. Primary Care Physician:No primary provider on file. Admit date: 11/01/2010 Chief Complaint: Shortness of breath chest pain HPI: Ms. Padia is a 50 year old female with a history of coronary artery disease status post stent placement in 2010, COPD who presents to the emergency department after sixth 2 weeks of worsening shortness of breath and cough. She denies any fever denies any nausea vomiting diarrhea. She's been having increased wheezing at home. She is still smoking at least a half a pack a day. She was in the process of being discharged from the emergency department on steroids and doxycycline when she started experiencing some sharp chest pain that lasted less than 1 minute. The chest pain is totally resolved now we have been asked to admit the patient for chest pain.  Past Medical History  Diagnosis Date  . Coronary artery disease   . Asthma   . Myocardial infarct   . Hypertension    Past Surgical History  Procedure Date  . Appendectomy   . Coronary stent placement   . Tubal ligation       History reviewed. No pertinent family history. Social History:  reports that she has been smoking Cigarettes.  She has been smoking about 1.5 packs per day. She does not have any smokeless tobacco history on file. She reports that she does not drink alcohol or use illicit drugs.   Allergies:  Allergies  Allergen Reactions  . Sulfonamide Derivatives Hives    Medications Prior to Admission  Medication Dose Route Frequency Provider Last Rate Last Dose  . albuterol (PROVENTIL) (5 MG/ML) 0.5% nebulizer solution 2.5 mg  2.5 mg Nebulization Q6H Khali Perella A Laurier Jasperson      . albuterol (PROVENTIL) (5 MG/ML) 0.5% nebulizer solution 2.5 mg  2.5 mg Nebulization Q2H PRN Lila Lufkin A Ormond Lazo      . albuterol (PROVENTIL) (5 MG/ML) 0.5% nebulizer solution 5 mg  5 mg Nebulization Q4H PRN April K Palumbo-Rasch, MD   5 mg at 11/01/10 0209  . alum & mag  hydroxide-simeth (MAALOX/MYLANTA) 200-200-20 MG/5ML suspension 30 mL  30 mL Oral Q6H PRN Ranferi Clingan A Alicia Ackert      . aspirin EC tablet 81 mg  81 mg Oral Daily Haifa Hatton A Michiah Mudry      . azithromycin (ZITHROMAX) tablet 500 mg  500 mg Oral Daily Jasaiah Karwowski A Yanelli Zapanta       Followed by  . azithromycin (ZITHROMAX) tablet 250 mg  250 mg Oral Daily Heitor Steinhoff A Dorianna Mckiver      . cefTRIAXone (ROCEPHIN) 1 g in dextrose 5 % 50 mL IVPB  1 g Intravenous Once April K Palumbo-Rasch, MD   1 g at 11/01/10 0441  . clonazePAM (KLONOPIN) tablet 0.5 mg  0.5 mg Oral BID PRN Kamari Bilek A Taevyn Hausen      . clopidogrel (PLAVIX) tablet 75 mg  75 mg Oral Daily Aybree Lanyon A Wilson Sample      . enoxaparin (LOVENOX) injection 40 mg  40 mg Subcutaneous Q24H Duvall Comes A Chucky Homes      . lamoTRIgine (LAMICTAL) tablet 100 mg  100 mg Oral Daily Cylan Borum A Malijah Lietz      . methylPREDNISolone sodium succinate (SOLU-MEDROL) 125 MG injection 125 mg  125 mg Intravenous Once April K Palumbo-Rasch, MD   125 mg at 11/01/10 0441  . multivitamins ther. w/minerals tablet 1 tablet  1 tablet Oral Daily Quinzell Malcomb A Aracely Rickett      . olmesartan (BENICAR) tablet 10 mg  10 mg Oral Daily Amilio Zehnder  A Marializ Ferrebee      . omega-3 acid ethyl esters (LOVAZA) capsule 1 g  1 g Oral BID Retina Bernardy A Devereaux Grayson      . simvastatin (ZOCOR) tablet 10 mg  10 mg Oral q1800 Dawnn Nam A Julis Haubner      . sodium chloride 0.9 % injection 3 mL  3 mL Intravenous Q12H Latonga Ponder A Madylyn Insco      . sodium chloride 0.9 % injection 3 mL  3 mL Intravenous PRN Randie Tallarico A Madhuri Vacca      . sodium chloride 0.9 % nebulizer solution           . DISCONTD: azithromycin (ZITHROMAX) 500 mg in dextrose 5 % 250 mL IVPB  500 mg Intravenous Q24H April K Palumbo-Rasch, MD   500 mg at 11/01/10 1610   No current outpatient prescriptions on file as of 11/01/2010.       RUE:AVWUJ from the symptoms mentioned above,there are no other symptoms referable to all systems reviewed.  Physical Exam: Blood pressure 118/76, pulse 86, temperature 97.7 F (36.5 C), temperature source Oral, resp. rate  20, height 5\' 3"  (1.6 m), weight 73.483 kg (162 lb), last menstrual period 08/31/2010, SpO2 95.00%. Alert and oriented no apparent distress cooperative and friendly regular rate and rhythm without murmurs rubs or gallops chest clear to auscultation bilaterally no wheezes rhonchi rales or crackles abdomen is soft nontender nondistended positive bowel sounds no   hepatosplenomegaly extremities without cyanosis or edema psych normal mood affect neuro no focal neurologic deficits skin no rashes Results for orders placed during the hospital encounter of 11/01/10 (from the past 48 hour(s))  CBC     Status: Abnormal   Collection Time   11/01/10  4:21 AM      Component Value Range Comment   WBC 5.5  4.0 - 10.5 (K/uL)    RBC 3.81 (*) 3.87 - 5.11 (MIL/uL)    Hemoglobin 12.9  12.0 - 15.0 (g/dL)    HCT 81.1  91.4 - 78.2 (%)    MCV 98.2  78.0 - 100.0 (fL)    MCH 33.9  26.0 - 34.0 (pg)    MCHC 34.5  30.0 - 36.0 (g/dL)    RDW 95.6  21.3 - 08.6 (%)    Platelets 297  150 - 400 (K/uL)   COMPREHENSIVE METABOLIC PANEL     Status: Abnormal   Collection Time   11/01/10  4:21 AM      Component Value Range Comment   Sodium 137  135 - 145 (mEq/L)    Potassium 3.7  3.5 - 5.1 (mEq/L)    Chloride 102  96 - 112 (mEq/L)    CO2 26  19 - 32 (mEq/L)    Glucose, Bld 107 (*) 70 - 99 (mg/dL)    BUN 4 (*) 6 - 23 (mg/dL)    Creatinine, Ser 5.78  0.50 - 1.10 (mg/dL)    Calcium 9.4  8.4 - 10.5 (mg/dL)    Total Protein 6.4  6.0 - 8.3 (g/dL)    Albumin 3.8  3.5 - 5.2 (g/dL)    AST 12  0 - 37 (U/L)    ALT 9  0 - 35 (U/L)    Alkaline Phosphatase 58  39 - 117 (U/L)    Total Bilirubin 0.3  0.3 - 1.2 (mg/dL)    GFR calc non Af Amer >60  >60 (mL/min)    GFR calc Af Amer >60  >60 (mL/min)   CARDIAC PANEL(CRET KIN+CKTOT+MB+TROPI)     Status:  Normal   Collection Time   11/01/10  4:22 AM      Component Value Range Comment   Total CK 51  7 - 177 (U/L)    CK, MB 2.2  0.3 - 4.0 (ng/mL)    Troponin I <0.30  <0.30 (ng/mL)     Relative Index RELATIVE INDEX IS INVALID  0.0 - 2.5    PREGNANCY, URINE     Status: Normal   Collection Time   11/01/10  4:23 AM      Component Value Range Comment   Preg Test, Ur NEGATIVE      No results found for this or any previous visit (from the past 240 hour(s)).  Dg Chest 2 View  11/01/2010  *RADIOLOGY REPORT*  Clinical Data: Cough, shortness of breath and chest soreness. History of smoking.  CHEST - 2 VIEW  Comparison: Chest radiograph performed 03/24/2010  Findings: The lungs are hyperexpanded, with flattening of the hemidiaphragms, compatible with COPD.  Mild chronic peribronchial thickening is noted.  Minimal bibasilar atelectasis is seen.  There is no evidence of pleural effusion or pneumothorax.  The heart is normal in size; the mediastinal contour is within normal limits.  No acute osseous abnormalities are seen.  IMPRESSION: Findings of COPD; minimal bibasilar atelectasis noted.  Lungs otherwise clear.  Original Report Authenticated By: Tonia Ghent, M.D.   Impression:  50 year old female who presents with mild COPD exacerbation in less than 1 minute of chest pain. Active Problems:  #1. Atypical chest pain likely related to her COPD exacerbation we'll serial cardiac enzymes place her on telemetry and observe her overnight. #2 COPD exacerbation which is mild we'll place the patient on azithromycin I do not think she needs any steroids at this point as her wheezing is totally resolved we'll increase the frequency of  her bronchodilators #3 tobacco abuse patient has been encouraged to stop #4 history of coronary artery disease status post stent placement in the past continue her Plavix and aspirin  Further recommendations depending on overall hospital course of observation overnight and likely discharged tomorrow if she has no deterioration.      Reonna Finlayson A 11/01/2010, 9:41 AM

## 2010-11-02 DIAGNOSIS — J441 Chronic obstructive pulmonary disease with (acute) exacerbation: Secondary | ICD-10-CM | POA: Diagnosis present

## 2010-11-02 DIAGNOSIS — R0789 Other chest pain: Secondary | ICD-10-CM | POA: Diagnosis present

## 2010-11-02 LAB — CARDIAC PANEL(CRET KIN+CKTOT+MB+TROPI)
CK, MB: 2.4 ng/mL (ref 0.3–4.0)
Relative Index: INVALID (ref 0.0–2.5)
Total CK: 52 U/L (ref 7–177)

## 2010-11-02 MED ORDER — AZITHROMYCIN 250 MG PO TABS: 250.0000 mg | ORAL_TABLET | Freq: Every day | ORAL | Status: DC

## 2010-11-02 MED ORDER — ALBUTEROL SULFATE HFA 108 (90 BASE) MCG/ACT IN AERS: 2.0000 | INHALATION_SPRAY | RESPIRATORY_TRACT | Status: DC | PRN

## 2010-11-02 NOTE — Progress Notes (Signed)
Patient d/c home with family Left floor via wheelchair  No c/o pain at d/c Verbalized understanding of dc instructions and follow up appt Lavaris Sexson, Kae Heller

## 2010-11-02 NOTE — Discharge Summary (Signed)
Physician Discharge Summary  Patient ID: Melissa Garcia MRN: [id number] DOB/AGE: 1960-05-23 50 y.o.  Admit date: 11/01/2010 Discharge date: 11/02/2010  Discharge Diagnoses:  Principal Problem:  *COPD with acute exacerbation Active Problems:  BRONCHITIS  Musculoskeletal chest pain  CAD (coronary artery disease), native coronary artery  BIPOLAR AFFECTIVE DISORDER, DEPRESSED  TOBACCO ABUSE   Current Discharge Medication List    START taking these medications   Details  azithromycin (ZITHROMAX) 250 MG tablet Take 1 tablet (250 mg total) by mouth daily. Qty: 3 each, Refills: 0      CONTINUE these medications which have CHANGED   Details  albuterol (PROVENTIL HFA;VENTOLIN HFA) 108 (90 BASE) MCG/ACT inhaler Inhale 2 puffs into the lungs every 4 (four) hours as needed for wheezing or shortness of breath. For shortness of breath      CONTINUE these medications which have NOT CHANGED   Details  aspirin EC 81 MG tablet Take 81 mg by mouth daily.      clonazePAM (KLONOPIN) 0.5 MG tablet Take 0.5 mg by mouth 2 (two) times daily as needed. For anxiety    clopidogrel (PLAVIX) 75 MG tablet Take 75 mg by mouth daily.      lamoTRIgine (LAMICTAL) 100 MG tablet Take 100 mg by mouth daily.      Multiple Vitamins-Minerals (MULTIVITAMINS THER. W/MINERALS) TABS Take 1 tablet by mouth daily.      omega-3 acid ethyl esters (LOVAZA) 1 G capsule Take 1 g by mouth 2 (two) times daily.      pravastatin (PRAVACHOL) 20 MG tablet Take 20 mg by mouth daily.      valsartan (DIOVAN) 80 MG tablet Take 80 mg by mouth daily.        STOP taking these medications     aspirin 81 MG tablet         Discharge Orders    Future Orders Please Complete By Expires   Diet - low sodium heart healthy      Increase activity slowly      Discharge instructions      Comments:   Quit smoking      Follow-up Information    Follow up in 3 weeks. (your own doctor)          Disposition: Home or Self  Care  Discharged Condition: Stable  Consults:   none  Labs:   Results for orders placed during the hospital encounter of 11/01/10 (from the past 48 hour(s))  CBC     Status: Abnormal   Collection Time   11/01/10  4:21 AM      Component Value Range Comment   WBC 5.5  4.0 - 10.5 (K/uL)    RBC 3.81 (*) 3.87 - 5.11 (MIL/uL)    Hemoglobin 12.9  12.0 - 15.0 (g/dL)    HCT 62.1  30.8 - 65.7 (%)    MCV 98.2  78.0 - 100.0 (fL)    MCH 33.9  26.0 - 34.0 (pg)    MCHC 34.5  30.0 - 36.0 (g/dL)    RDW 84.6  96.2 - 95.2 (%)    Platelets 297  150 - 400 (K/uL)   COMPREHENSIVE METABOLIC PANEL     Status: Abnormal   Collection Time   11/01/10  4:21 AM      Component Value Range Comment   Sodium 137  135 - 145 (mEq/L)    Potassium 3.7  3.5 - 5.1 (mEq/L)    Chloride 102  96 - 112 (mEq/L)  CO2 26  19 - 32 (mEq/L)    Glucose, Bld 107 (*) 70 - 99 (mg/dL)    BUN 4 (*) 6 - 23 (mg/dL)    Creatinine, Ser 1.61  0.50 - 1.10 (mg/dL)    Calcium 9.4  8.4 - 10.5 (mg/dL)    Total Protein 6.4  6.0 - 8.3 (g/dL)    Albumin 3.8  3.5 - 5.2 (g/dL)    AST 12  0 - 37 (U/L)    ALT 9  0 - 35 (U/L)    Alkaline Phosphatase 58  39 - 117 (U/L)    Total Bilirubin 0.3  0.3 - 1.2 (mg/dL)    GFR calc non Af Amer >60  >60 (mL/min)    GFR calc Af Amer >60  >60 (mL/min)   CARDIAC PANEL(CRET KIN+CKTOT+MB+TROPI)     Status: Normal   Collection Time   11/01/10  4:22 AM      Component Value Range Comment   Total CK 51  7 - 177 (U/L)    CK, MB 2.2  0.3 - 4.0 (ng/mL)    Troponin I <0.30  <0.30 (ng/mL)    Relative Index RELATIVE INDEX IS INVALID  0.0 - 2.5    PREGNANCY, URINE     Status: Normal   Collection Time   11/01/10  4:23 AM      Component Value Range Comment   Preg Test, Ur NEGATIVE     CARDIAC PANEL(CRET KIN+CKTOT+MB+TROPI)     Status: Normal   Collection Time   11/01/10  3:57 PM      Component Value Range Comment   Total CK 57  7 - 177 (U/L)    CK, MB 2.4  0.3 - 4.0 (ng/mL)    Troponin I <0.30  <0.30 (ng/mL)     Relative Index RELATIVE INDEX IS INVALID  0.0 - 2.5    CARDIAC PANEL(CRET KIN+CKTOT+MB+TROPI)     Status: Normal   Collection Time   11/01/10 11:34 PM      Component Value Range Comment   Total CK 52  7 - 177 (U/L)    CK, MB 2.4  0.3 - 4.0 (ng/mL)    Troponin I <0.30  <0.30 (ng/mL)    Relative Index RELATIVE INDEX IS INVALID  0.0 - 2.5    CARDIAC PANEL(CRET KIN+CKTOT+MB+TROPI)     Status: Normal   Collection Time   11/02/10  8:49 AM      Component Value Range Comment   Total CK 44  7 - 177 (U/L)    CK, MB 2.3  0.3 - 4.0 (ng/mL)    Troponin I <0.30  <0.30 (ng/mL)    Relative Index RELATIVE INDEX IS INVALID  0.0 - 2.5      Diagnostics:  Dg Chest 2 View  11/01/2010  *RADIOLOGY REPORT*  Clinical Data: Cough, shortness of breath and chest soreness. History of smoking.  CHEST - 2 VIEW  Comparison: Chest radiograph performed 03/24/2010  Findings: The lungs are hyperexpanded, with flattening of the hemidiaphragms, compatible with COPD.  Mild chronic peribronchial thickening is noted.  Minimal bibasilar atelectasis is seen.  There is no evidence of pleural effusion or pneumothorax.  The heart is normal in size; the mediastinal contour is within normal limits.  No acute osseous abnormalities are seen.  IMPRESSION: Findings of COPD; minimal bibasilar atelectasis noted.  Lungs otherwise clear.  Original Report Authenticated By: Tonia Ghent, M.D.   EKG: Normal sinus rhythm nonspecific changes first degree AV block  Full  Code   Hospital Course:   Please see H&P for complete admission details. Melissa Garcia is a pleasant 50 year old white female smoker who presented to the emergency room with cough and shortness of breath. She was found to have acute bronchitis and COPD exacerbation. She was ready to be discharged when she suddenly had sharp left-sided chest pain. She was admitted to the hospitalist service overnight. She ruled out for MI and had no further chest pain. She has no wheezing currently. She  still coughing. She's encouraged to quit smoking. Care management has been consult to assist with home medications if needed and financial counseling. Total time on the day of discharge is greater than 30 minutes.  Discharge Exam: Blood pressure 113/72, pulse 98, temperature 97.9 F (36.6 C), temperature source Oral, resp. rate 19, height 5\' 3"  (1.6 m), weight 73.4 kg (161 lb 13.1 oz), last menstrual period 08/31/2010, SpO2 98.00%.  General comfortable speaking in full sentences. Lungs clear to auscultation bilaterally without wheezes rhonchi or rales Cardiovascular regular rate rhythm without murmurs gallops rubs Musculoskeletal no chest wall tenderness Abdomen soft nontender nondistended Extremities no clubbing cyanosis or edema   Signed: Jaslin Novitski L 11/02/2010, 10:32 AM

## 2010-11-03 LAB — BASIC METABOLIC PANEL
CO2: 27
Calcium: 9
Chloride: 107
GFR calc Af Amer: >60
Potassium: 3.1 — ABNORMAL LOW
Sodium: 137

## 2011-02-11 ENCOUNTER — Emergency Department (HOSPITAL_COMMUNITY)
Admission: EM | Admit: 2011-02-11 | Discharge: 2011-02-12 | Disposition: A | Discharge status: Home / Self Care | Payer: Medicaid Other | Attending: Emergency Medicine | Admitting: Emergency Medicine

## 2011-02-11 ENCOUNTER — Encounter (HOSPITAL_COMMUNITY): Payer: Self-pay

## 2011-02-11 ENCOUNTER — Emergency Department (HOSPITAL_COMMUNITY): Payer: Medicaid Other

## 2011-02-11 DIAGNOSIS — I252 Old myocardial infarction: Secondary | ICD-10-CM | POA: Insufficient documentation

## 2011-02-11 DIAGNOSIS — I2510 Atherosclerotic heart disease of native coronary artery without angina pectoris: Secondary | ICD-10-CM | POA: Insufficient documentation

## 2011-02-11 DIAGNOSIS — J40 Bronchitis, not specified as acute or chronic: Secondary | ICD-10-CM | POA: Insufficient documentation

## 2011-02-11 DIAGNOSIS — Z7982 Long term (current) use of aspirin: Secondary | ICD-10-CM | POA: Insufficient documentation

## 2011-02-11 DIAGNOSIS — I10 Essential (primary) hypertension: Secondary | ICD-10-CM | POA: Insufficient documentation

## 2011-02-11 DIAGNOSIS — J45909 Unspecified asthma, uncomplicated: Secondary | ICD-10-CM | POA: Insufficient documentation

## 2011-02-11 MED ORDER — IPRATROPIUM BROMIDE 0.02 % IN SOLN
0.5000 mg | Freq: Once | RESPIRATORY_TRACT | Status: AC
  Administered 2011-02-11: 0.5 mg via RESPIRATORY_TRACT
  Filled 2011-02-11: qty 2.5

## 2011-02-11 MED ORDER — ALBUTEROL SULFATE (5 MG/ML) 0.5% IN NEBU
2.5000 mg | INHALATION_SOLUTION | Freq: Once | RESPIRATORY_TRACT | Status: AC
  Administered 2011-02-11: 2.5 mg via RESPIRATORY_TRACT
  Filled 2011-02-11: qty 0.5

## 2011-02-11 MED ORDER — DOXYCYCLINE HYCLATE 100 MG PO TABS
100.0000 mg | ORAL_TABLET | Freq: Once | ORAL | Status: AC
  Administered 2011-02-12: 100 mg via ORAL
  Filled 2011-02-11: qty 1

## 2011-02-11 MED ORDER — GUAIFENESIN-CODEINE 100-10 MG/5ML PO SOLN
5.0000 mL | Freq: Once | ORAL | Status: AC
  Administered 2011-02-11: 5 mL via ORAL
  Filled 2011-02-11: qty 5

## 2011-02-11 NOTE — ED Notes (Signed)
Pt presents with chest congestion, ear pain, SOB, and chest tightness x 7 days.

## 2011-02-11 NOTE — ED Provider Notes (Signed)
History   This chart was scribed for [employer]. Colon Branch, MD by Sofie Rower. The patient was seen in room APA07/APA07 and the patient's care was started at 10:04PM.    CSN: [id number]  Arrival date & time 02/11/11  2126   First MD Initiated Contact with Patient 02/11/11 2157      Chief Complaint  Patient presents with  . Cough  . Shortness of Breath    (Consider location/radiation/quality/duration/timing/severity/associated sxs/prior treatment) HPI  Melissa Garcia is a 51 y.o. female who presents to the Emergency Department complaining of moderate, constant productive cough onset two weeks with associated symptoms of vomiting. Pt is a smoker, last cigarette was this morning. Pt denies any other medical problems. Pt is on disability for bipolar disorder.  Pt does not have a PCP, but is in the process of obtaining Medicaid, currently uses [facility].  Past Medical History  Diagnosis Date  . Coronary artery disease   . Asthma   . Myocardial infarct   . Hypertension     Past Surgical History  Procedure Date  . Appendectomy   . Coronary stent placement   . Tubal ligation     No family history on file.  History  Substance Use Topics  . Smoking status: Current Some Day Smoker -- 1.5 packs/day    Types: Cigarettes  . Smokeless tobacco: Not on file  . Alcohol Use: No    OB History    Grav Para Term Preterm Abortions TAB SAB Ect Mult Living                  Review of Systems  10 Systems reviewed and are negative for acute change except as noted in the HPI.   Allergies  Sulfonamide derivatives  Home Medications   Current Outpatient Rx  Name Route Sig Dispense Refill  . ALBUTEROL SULFATE HFA 108 (90 BASE) MCG/ACT IN AERS Inhalation Inhale 2 puffs into the lungs every 4 (four) hours as needed for wheezing or shortness of breath. For shortness of breath    . ASPIRIN EC 81 MG PO TBEC Oral Take 81 mg by mouth daily.      . AZITHROMYCIN 250 MG PO TABS Oral Take  1 tablet (250 mg total) by mouth daily. 3 each 0  . CLONAZEPAM 0.5 MG PO TABS Oral Take 0.5 mg by mouth 2 (two) times daily as needed. For anxiety    . CLOPIDOGREL BISULFATE 75 MG PO TABS Oral Take 75 mg by mouth daily.      Marland Kitchen LAMOTRIGINE 100 MG PO TABS Oral Take 100 mg by mouth daily.      Carma Leaven M PLUS PO TABS Oral Take 1 tablet by mouth daily.      . OMEGA-3-ACID ETHYL ESTERS 1 G PO CAPS Oral Take 1 g by mouth 2 (two) times daily.      Marland Kitchen PRAVASTATIN SODIUM 20 MG PO TABS Oral Take 20 mg by mouth daily.      Marland Kitchen VALSARTAN 80 MG PO TABS Oral Take 80 mg by mouth daily.        BP 126/71  Pulse 75  Temp(Src) 98 F (36.7 C) (Oral)  Resp 20  Ht 5\' 3"  (1.6 m)  Wt 162 lb (73.483 kg)  BMI 28.70 kg/m2  SpO2 98%  Physical Exam  Nursing note and vitals reviewed. Constitutional: She is oriented to person, place, and time. She appears well-developed and well-nourished. No distress.  HENT:  Head: Normocephalic and atraumatic.  Nose: Nose normal.       Post nasal drip.  Eyes: EOM are normal. Pupils are equal, round, and reactive to light.  Neck: Normal range of motion. Neck supple. No tracheal deviation present.  Cardiovascular: Normal rate and regular rhythm.  Exam reveals no gallop and no friction rub.   No murmur heard. Pulmonary/Chest: Effort normal. No respiratory distress. She has wheezes (Diffuse wheezing bilaterally with good air movement.).  Abdominal: Soft. She exhibits no distension. There is no tenderness.  Musculoskeletal: Normal range of motion. She exhibits no edema.  Neurological: She is alert and oriented to person, place, and time. No sensory deficit.  Skin: Skin is warm and dry.  Psychiatric: She has a normal mood and affect. Her behavior is normal.    ED Course  Procedures (including critical care time)  DIAGNOSTIC STUDIES: Oxygen Saturation is 98% on room air, normal by my interpretation.    COORDINATION OF CARE:    Dg Chest 2 View  02/11/2011  *RADIOLOGY REPORT*   Clinical Data: Cough.  Right anterior chest pain.  CHEST - 2 VIEW  Comparison: 11/01/2010.  Findings:  Cardiopericardial silhouette within normal limits. Mediastinal contours normal. Trachea midline.  No airspace disease or effusion.  IMPRESSION: No active cardiopulmonary disease.  Original Report Authenticated By: Andreas Newport, M.D.     MDM  Patient with cough that has over the last week developed yellow green sputum production. Associated with the cough are shortness of breath when coughing and chest pain when coughing. Given albuterol/atrovent treatment with resolution of wheezing and improvement in cough. Xray with no acute findings. Initiated antibiotic treatment. Pt feels improved after observation and/or treatment in ED.Pt stable in ED with no significant deterioration in condition.The patient appears reasonably screened and/or stabilized for discharge and I doubt any other medical condition or other [facility] requiring further screening, evaluation, or treatment in the ED at this time prior to discharge.   10:10PM- EDP at bedside discusses treatment plan.  I personally performed the services described in this documentation, which was scribed in my presence. The recorded information has been reviewed and considered.  MDM Reviewed: nursing note and vitals Interpretation: x-ray         Nicoletta Dress. Colon Branch, MD 02/13/11 [phone]

## 2011-02-12 MED ORDER — PREDNISONE 20 MG PO TABS
60.0000 mg | ORAL_TABLET | Freq: Once | ORAL | Status: AC
  Administered 2011-02-12: 60 mg via ORAL
  Filled 2011-02-12: qty 3

## 2011-02-12 MED ORDER — HYDROCODONE-ACETAMINOPHEN 5-325 MG PO TABS
2.0000 | ORAL_TABLET | Freq: Once | ORAL | Status: AC
  Administered 2011-02-12: 2 via ORAL
  Filled 2011-02-12: qty 2

## 2011-02-12 MED ORDER — PREDNISONE 10 MG PO TABS: 20.0000 mg | ORAL_TABLET | Freq: Every day | ORAL | Status: AC

## 2011-02-12 MED ORDER — AZITHROMYCIN 250 MG PO TABS: ORAL_TABLET | ORAL | Status: DC

## 2011-02-12 MED ORDER — HYDROCODONE-ACETAMINOPHEN 5-325 MG PO TABS: 1.0000 | ORAL_TABLET | ORAL | Status: AC | PRN

## 2011-05-18 ENCOUNTER — Ambulatory Visit: Payer: Medicaid Other | Admitting: Cardiovascular Disease

## 2011-06-05 ENCOUNTER — Encounter: Payer: Self-pay | Admitting: Cardiology

## 2011-06-06 ENCOUNTER — Ambulatory Visit (INDEPENDENT_AMBULATORY_CARE_PROVIDER_SITE_OTHER): Payer: Medicaid Other | Admitting: Cardiology

## 2011-06-06 ENCOUNTER — Encounter: Payer: Self-pay | Admitting: Cardiology

## 2011-06-06 VITALS — BP 125/84 | HR 97 | Resp 16 | Ht 63.0 in | Wt 160.0 lb

## 2011-06-06 DIAGNOSIS — R072 Precordial pain: Secondary | ICD-10-CM

## 2011-06-06 DIAGNOSIS — F17200 Nicotine dependence, unspecified, uncomplicated: Secondary | ICD-10-CM

## 2011-06-06 DIAGNOSIS — I429 Cardiomyopathy, unspecified: Secondary | ICD-10-CM

## 2011-06-06 DIAGNOSIS — I10 Essential (primary) hypertension: Secondary | ICD-10-CM

## 2011-06-06 DIAGNOSIS — I2510 Atherosclerotic heart disease of native coronary artery without angina pectoris: Secondary | ICD-10-CM

## 2011-06-06 DIAGNOSIS — E782 Mixed hyperlipidemia: Secondary | ICD-10-CM

## 2011-06-06 MED ORDER — NITROGLYCERIN 0.4 MG SL SUBL: 0.4000 mg | SUBLINGUAL_TABLET | SUBLINGUAL | Status: DC | PRN

## 2011-06-06 MED ORDER — BISOPROLOL FUMARATE 5 MG PO TABS: 5.0000 mg | ORAL_TABLET | Freq: Every day | ORAL | Status: DC

## 2011-06-06 NOTE — Assessment & Plan Note (Signed)
Smoking cessation was discussed. She has not been able to quit.

## 2011-06-06 NOTE — Assessment & Plan Note (Signed)
Bisoprolol being added today, also to serve as an antianginal.

## 2011-06-06 NOTE — Patient Instructions (Signed)
**Note De-Identified  Obfuscation** Your physician has requested that you have an echocardiogram. Echocardiography is a painless test that uses sound waves to create images of your heart. It provides your doctor with information about the size and shape of your heart and how well your heart's chambers and valves are working. This procedure takes approximately one hour. There are no restrictions for this procedure.  Your physician has requested that you have a lexiscan myoview. For further information please visit [email or web address] Please follow instruction sheet, as given.  Your physician has recommended you make the following change in your medication: start taking Bisoprolol 5 mg daily and Nitroglycerin as needed for chest pain  Your physician recommends that you return for lab work in: Before next visit   Your physician recommends that you schedule a follow-up appointment in: 3 to 4 weeks

## 2011-06-06 NOTE — Assessment & Plan Note (Signed)
History outlined above with no interval followup since 2010. ECG today is normal. She does report some atypical chest pain symptoms, also chronic dyspnea on exertion with component of emphysema also suspected. Plan at this point is to continue aspirin and omega-3 supplements, provide prescription for bisoprolol beginning at 5 mg daily and also p.r.n. nitroglycerin. We will further evaluate ischemic burden with a Lexiscan Myoview, and can then determine whether observation on medical therapy should continue versus pursuing additional evaluation. Office followup arranged.

## 2011-06-06 NOTE — Assessment & Plan Note (Signed)
She was previously on Pravachol. Followup LFT and FLP.

## 2011-06-06 NOTE — Progress Notes (Signed)
Clinical Summary Melissa Garcia is a 51 y.o.female referred for cardiology evaluation by Melissa Garcia with [facility]. She has had no regular cardiology followup since 2010 - had intervention with Dr. Elease Garcia at that time in the setting of an AMI. She has had no regular health insurance until recently.  I reviewed her cardiac history. She reports no interval testing since 2010. She has been on no specific medications over the last few months with the exception of aspirin and omega-3 supplements as well as p.r.n. inhalers. She has had no followup lipids.  From a symptom perspective, she does report intermittent chest pain, sharp and atypical, not clearly exertional. Also chronic dyspnea on exertion, NYHA class 2-3. She has had more difficulty with the pollen recently.  She has not been able to quit smoking despite previous attempts. Also cites other psychosocial and family stressors.    Allergies  Allergen Reactions  . Sulfonamide Derivatives Hives    Current Outpatient Prescriptions  Medication Sig Dispense Refill  . albuterol (PROVENTIL HFA;VENTOLIN HFA) 108 (90 BASE) MCG/ACT inhaler Inhale 2 puffs into the lungs every 4 (four) hours as needed for wheezing or shortness of breath. For shortness of breath      . aspirin EC 81 MG tablet Take 81 mg by mouth daily.        . clonazePAM (KLONOPIN) 0.5 MG tablet Take 0.5 mg by mouth 2 (two) times daily as needed. For anxiety      . Multiple Vitamins-Minerals (MULTIVITAMINS THER. W/MINERALS) TABS Take 1 tablet by mouth daily.        . Omega-3 Fatty Acids (FISH OIL PO) Take by mouth daily.      . bisoprolol (ZEBETA) 5 MG tablet Take 1 tablet (5 mg total) by mouth daily.  30 tablet  3  . nitroGLYCERIN (NITROSTAT) 0.4 MG SL tablet Place 1 tablet (0.4 mg total) under the tongue every 5 (five) minutes as needed for chest pain.  25 tablet  3    Past Medical History  Diagnosis Date  . Coronary atherosclerosis of native coronary  artery     DES LAD and PTCA diagonal 8/10, LVEF 30% 8/10  . COPD (chronic obstructive pulmonary disease)   . Myocardial infarct     AMI 8/10  . Essential hypertension, benign   . Urinary incontinence   . Bipolar disorder     Past Surgical History  Procedure Date  . Appendectomy   . Tubal ligation     Family History  Problem Relation Age of Onset  . Coronary artery disease    . Hypertension      Social History Melissa Garcia reports that she has been smoking Cigarettes.  She has been smoking about 1.5 packs per day. She does not have any smokeless tobacco history on file. Melissa Garcia reports that she does not drink alcohol.  Review of Systems No palpitations or syncope. No reported bleeding problems. Reports problems with anxiety and depression. Stable appetite. No orthopnea or PND. Otherwise negative except as outlined.  Physical Examination Filed Vitals:   06/06/11 1443  BP: 125/84  Pulse: 97  Resp: 16   Overweight woman in no acute distress. HEENT: Conjunctiva and lids normal, oropharynx clear with poor dentition. Neck: Supple, no elevated JVP or carotid bruits, no thyromegaly. Lungs: Diminished but clear to auscultation, nonlabored breathing at rest. Cardiac: Regular rate and rhythm, no S3 with 2/6 systolic murmur, no pericardial rub. Abdomen: Soft, nontender, bowel sounds present, no guarding or  rebound. Extremities: No pitting edema, distal pulses 2+. Skin: Warm and dry. Musculoskeletal: No kyphosis. Neuropsychiatric: Alert and oriented x3, affect grossly appropriate.   ECG Sinus rhythm at 84.    Problem List and Plan

## 2011-06-06 NOTE — Assessment & Plan Note (Signed)
LVEF was 30% as of August 2010, no followup assessment. She denies any palpitations or syncope. No definite CHF decompensation. Plan is a followup complete echocardiogram to reassess cardiac structure and function as this may further help to guide medical therapy and other potential treatment strategies.

## 2011-06-07 NOTE — Progress Notes (Signed)
**Note De-Identified Alfie Alderfer Obfuscation** Addended by: Demetrios Loll on: 06/07/2011 10:03 AM   Modules accepted: Orders

## 2011-06-14 ENCOUNTER — Encounter (HOSPITAL_COMMUNITY): Payer: Self-pay

## 2011-06-14 ENCOUNTER — Encounter (HOSPITAL_COMMUNITY)
Admission: RE | Admit: 2011-06-14 | Discharge: 2011-06-14 | Disposition: A | Discharge status: Home / Self Care | Payer: Medicaid Other | Source: Ambulatory Visit | Attending: Cardiology | Admitting: Cardiology

## 2011-06-14 ENCOUNTER — Ambulatory Visit (INDEPENDENT_AMBULATORY_CARE_PROVIDER_SITE_OTHER): Payer: Medicaid Other

## 2011-06-14 ENCOUNTER — Ambulatory Visit (HOSPITAL_COMMUNITY)
Admission: RE | Admit: 2011-06-14 | Discharge: 2011-06-14 | Disposition: A | Discharge status: Home / Self Care | Payer: Medicaid Other | Source: Ambulatory Visit | Attending: Cardiology | Admitting: Cardiology

## 2011-06-14 DIAGNOSIS — I10 Essential (primary) hypertension: Secondary | ICD-10-CM | POA: Insufficient documentation

## 2011-06-14 DIAGNOSIS — R072 Precordial pain: Secondary | ICD-10-CM

## 2011-06-14 DIAGNOSIS — I2510 Atherosclerotic heart disease of native coronary artery without angina pectoris: Secondary | ICD-10-CM

## 2011-06-14 DIAGNOSIS — E785 Hyperlipidemia, unspecified: Secondary | ICD-10-CM | POA: Insufficient documentation

## 2011-06-14 DIAGNOSIS — R079 Chest pain, unspecified: Secondary | ICD-10-CM | POA: Insufficient documentation

## 2011-06-14 DIAGNOSIS — F17200 Nicotine dependence, unspecified, uncomplicated: Secondary | ICD-10-CM | POA: Insufficient documentation

## 2011-06-14 DIAGNOSIS — I359 Nonrheumatic aortic valve disorder, unspecified: Secondary | ICD-10-CM

## 2011-06-14 DIAGNOSIS — I429 Cardiomyopathy, unspecified: Secondary | ICD-10-CM

## 2011-06-14 MED ORDER — TECHNETIUM TC 99M TETROFOSMIN IV KIT
30.0000 | PACK | Freq: Once | INTRAVENOUS | Status: AC | PRN
  Administered 2011-06-14: 30 via INTRAVENOUS

## 2011-06-14 MED ORDER — TECHNETIUM TC 99M TETROFOSMIN IV KIT
10.0000 | PACK | Freq: Once | INTRAVENOUS | Status: AC | PRN
  Administered 2011-06-14: 10.5 via INTRAVENOUS

## 2011-06-14 NOTE — Progress Notes (Signed)
*  PRELIMINARY RESULTS* Echocardiogram 2D Echocardiogram has been performed.  Conrad Olivehurst 06/14/2011, 9:15 AM

## 2011-06-14 NOTE — Progress Notes (Signed)
Stress Lab Nurses Notes - Melissa Garcia  Melissa Garcia 06/14/2011 Reason for doing test: CAD and Chest Pain Type of test: Steffanie Dunn Nurse performing test: Parke Poisson, RN Nuclear Medicine Tech: Lou Cal Echo Tech: Not Applicable MD performing test: Ival Bible Family MD:  [facility] Test explained and consent signed: yes IV started: 22g jelco, Saline lock flushed, No redness or edema and Saline lock started in radiology Symptoms: SOB & Nausea Treatment/Intervention: None Reason test stopped: protocol completed After recovery IV was: Discontinued via X-ray tech and No redness or edema Patient to return to Nuc. Med at : 11:45 Patient discharged: Home Patient's Condition upon discharge was: stable Comments: During test BP 118/60 & HR 85.  Recovery BP 110/60 & HR 72.  Symptoms resolved in recovery. [name] T

## 2011-06-26 ENCOUNTER — Telehealth: Payer: Self-pay

## 2011-06-26 NOTE — Telephone Encounter (Signed)
**Note De-Identified  Obfuscation** The patient states that she forgot lab work and will have drawn this week./LV

## 2011-06-26 NOTE — Telephone Encounter (Signed)
**Note De-Identified Kimberlee Shoun Obfuscation** Message copied by Demetrios Loll on Tue Jun 26, 2011 11:14 AM ------      Message from: Kyler Lerette M      Created: Wed Jun 06, 2011  3:38 PM      Regarding: Echo/Lexiscan/Lipid/Lft's       All to be done on 5-9

## 2011-07-03 ENCOUNTER — Other Ambulatory Visit: Payer: Self-pay | Admitting: Cardiology

## 2011-07-04 LAB — HEPATIC FUNCTION PANEL
Albumin: 4 g/dL (ref 3.5–5.2)
Alkaline Phosphatase: 54 U/L (ref 39–117)
Total Protein: 6.4 g/dL (ref 6.0–8.3)

## 2011-07-04 LAB — LIPID PANEL
HDL: 39 mg/dL — ABNORMAL LOW (ref >39)
LDL Cholesterol: 82 mg/dL (ref 0–99)
Total CHOL/HDL Ratio: 3.9 Ratio
Triglycerides: 156 mg/dL — ABNORMAL HIGH (ref <150)
VLDL: 31 mg/dL (ref 0–40)

## 2011-07-05 ENCOUNTER — Ambulatory Visit (INDEPENDENT_AMBULATORY_CARE_PROVIDER_SITE_OTHER): Payer: Medicaid Other | Admitting: Cardiology

## 2011-07-05 ENCOUNTER — Encounter: Payer: Self-pay | Admitting: Cardiology

## 2011-07-05 VITALS — BP 117/75 | HR 87 | Resp 16 | Ht 63.0 in | Wt 159.0 lb

## 2011-07-05 DIAGNOSIS — I10 Essential (primary) hypertension: Secondary | ICD-10-CM

## 2011-07-05 DIAGNOSIS — F17200 Nicotine dependence, unspecified, uncomplicated: Secondary | ICD-10-CM

## 2011-07-05 DIAGNOSIS — E782 Mixed hyperlipidemia: Secondary | ICD-10-CM

## 2011-07-05 DIAGNOSIS — I2510 Atherosclerotic heart disease of native coronary artery without angina pectoris: Secondary | ICD-10-CM

## 2011-07-05 NOTE — Progress Notes (Signed)
   Clinical Summary Melissa Garcia is a 51 y.o.female presenting for followup. She was seen in early May to reestablish cardiology followup.  Followup echocardiogram showed improvement in LVEF to the range of 55-60%, grade 2 diastolic dysfunction, mild aortic regurgitation, mild mitral regurgitation. Myoview demonstrated evidence of scar within the anteroapical and inferior apical distribution, no large degree of ischemia however, LVEF 62%. Lab work showed total cholesterol 152, triglycerides 156, HDL 39, LDL 82. We reviewed these today.  She reports no chest pain. We discussed her medications and also efforts to address smoking cessation.   Allergies  Allergen Reactions  . Sulfonamide Derivatives Hives    Current Outpatient Prescriptions  Medication Sig Dispense Refill  . albuterol (PROVENTIL HFA;VENTOLIN HFA) 108 (90 BASE) MCG/ACT inhaler Inhale 2 puffs into the lungs every 4 (four) hours as needed for wheezing or shortness of breath. For shortness of breath      . aspirin EC 81 MG tablet Take 81 mg by mouth daily.        . Multiple Vitamins-Minerals (MULTIVITAMINS THER. W/MINERALS) TABS Take 1 tablet by mouth daily.        . nitroGLYCERIN (NITROSTAT) 0.4 MG SL tablet Place 1 tablet (0.4 mg total) under the tongue every 5 (five) minutes as needed for chest pain.  25 tablet  3  . Omega-3 Fatty Acids (FISH OIL PO) Take by mouth daily.        Past Medical History  Diagnosis Date  . Coronary atherosclerosis of native coronary artery     DES LAD and PTCA diagonal 8/10, LVEF 30% 8/10  . COPD (chronic obstructive pulmonary disease)   . Myocardial infarct     AMI 8/10  . Essential hypertension, benign   . Urinary incontinence   . Bipolar disorder     Social History Melissa Garcia reports that she has been smoking Cigarettes.  She has been smoking about 1.5 packs per day. She does not have any smokeless tobacco history on file. Melissa Garcia reports that she does not drink alcohol.  Review of  Systems No palpitations or syncope. No bleeding problems. Fatigue, some depressed mood. Otherwise negative.  Physical Examination Filed Vitals:   07/05/11 1329  BP: 117/75  Pulse: 87  Resp: 16    Overweight woman in no acute distress.  HEENT: Conjunctiva and lids normal, oropharynx clear with poor dentition.  Neck: Supple, no elevated JVP or carotid bruits, no thyromegaly.  Lungs: Diminished but clear to auscultation, nonlabored breathing at rest.  Cardiac: Regular rate and rhythm, no S3 with 2/6 systolic murmur, no pericardial rub.  Abdomen: Soft, nontender, bowel sounds present, no guarding or rebound.  Extremities: No pitting edema, distal pulses 2+.    Problem List and Plan   CAD (coronary artery disease), native coronary artery Symptomatically stable. Myoview is consistent with prior infarct but shows no significant ischemia. Most importantly, her LVEF has normalized. Continue observation for now.  Mixed hyperlipidemia Lipids actually look quite good with goal LDL. Continue omega-3 supplements. Not on statin at this point.  TOBACCO ABUSE Smoking cessation discussed.  Essential hypertension, benign Normal blood pressure. Not on antihypertensives now.     Jonelle Sidle, M.D., F.A.C.C.

## 2011-07-05 NOTE — Patient Instructions (Signed)
**Note De-identified  Obfuscation** Your physician recommends that you continue on your current medications as directed. Please refer to the Current Medication list given to you today.  Your physician recommends that you schedule a follow-up appointment in: 6 months  

## 2011-07-05 NOTE — Assessment & Plan Note (Signed)
Normal blood pressure. Not on antihypertensives now.

## 2011-07-05 NOTE — Assessment & Plan Note (Signed)
Symptomatically stable. Myoview is consistent with prior infarct but shows no significant ischemia. Most importantly, her LVEF has normalized. Continue observation for now.

## 2011-07-05 NOTE — Assessment & Plan Note (Signed)
Lipids actually look quite good with goal LDL. Continue omega-3 supplements. Not on statin at this point.

## 2011-07-05 NOTE — Assessment & Plan Note (Signed)
Smoking cessation discussed 

## 2012-02-12 ENCOUNTER — Encounter: Payer: Medicaid Other | Admitting: Cardiology

## 2012-02-12 ENCOUNTER — Telehealth: Payer: Self-pay | Admitting: Cardiology

## 2012-02-12 ENCOUNTER — Encounter: Payer: Self-pay | Admitting: Cardiology

## 2012-02-12 NOTE — Progress Notes (Signed)
No show  This encounter was created in error - please disregard.

## 2012-02-12 NOTE — Telephone Encounter (Signed)
Patient no showed appointment.  Message left on voicemail for patient to call office to reschedule.  Letter also mailed. / tgs

## 2012-02-26 ENCOUNTER — Ambulatory Visit (INDEPENDENT_AMBULATORY_CARE_PROVIDER_SITE_OTHER): Payer: Medicaid Other | Admitting: Cardiology

## 2012-02-26 ENCOUNTER — Encounter: Payer: Self-pay | Admitting: Cardiology

## 2012-02-26 VITALS — BP 126/76 | HR 78 | Ht 63.0 in | Wt 163.0 lb

## 2012-02-26 DIAGNOSIS — I2510 Atherosclerotic heart disease of native coronary artery without angina pectoris: Secondary | ICD-10-CM

## 2012-02-26 DIAGNOSIS — I10 Essential (primary) hypertension: Secondary | ICD-10-CM

## 2012-02-26 DIAGNOSIS — F17200 Nicotine dependence, unspecified, uncomplicated: Secondary | ICD-10-CM

## 2012-02-26 DIAGNOSIS — E782 Mixed hyperlipidemia: Secondary | ICD-10-CM

## 2012-02-26 NOTE — Assessment & Plan Note (Signed)
Blood pressure looks fairly good today.

## 2012-02-26 NOTE — Assessment & Plan Note (Signed)
Still working on complete smoking cessation.

## 2012-02-26 NOTE — Progress Notes (Signed)
   Clinical Summary [name] is a 52 y.o.female presenting for followup. She was seen in May 2013. She reports no angina symptoms, no palpitations or syncope. Sounds like she is still getting over an upper respiratory tract infection, treated recently by her primary care provider.  ECG today shows sinus rhythm with borderline low voltage.  Followup testing from last year reviewed. Echocardiogram showed improvement in LVEF to the range of 55-60%, grade 2 diastolic dysfunction, mild aortic regurgitation, mild mitral regurgitation. Myoview demonstrated evidence of scar within the anteroapical and inferior apical distribution, no large degree of ischemia however, LVEF 62%.   Allergies  Allergen Reactions  . Sulfonamide Derivatives Hives    Current Outpatient Prescriptions  Medication Sig Dispense Refill  . albuterol (PROVENTIL HFA;VENTOLIN HFA) 108 (90 BASE) MCG/ACT inhaler Inhale 2 puffs into the lungs every 4 (four) hours as needed for wheezing or shortness of breath. For shortness of breath      . aspirin EC 81 MG tablet Take 81 mg by mouth daily.        . Multiple Vitamins-Minerals (MULTIVITAMINS THER. W/MINERALS) TABS Take 1 tablet by mouth daily.        . nitroGLYCERIN (NITROSTAT) 0.4 MG SL tablet Place 1 tablet (0.4 mg total) under the tongue every 5 (five) minutes as needed for chest pain.  25 tablet  3  . Omega-3 Fatty Acids (FISH OIL PO) Take by mouth daily.        Past Medical History  Diagnosis Date  . Coronary atherosclerosis of native coronary artery     DES LAD and PTCA diagonal 8/10, LVEF 30% 8/10  . COPD (chronic obstructive pulmonary disease)   . Myocardial infarct     AMI 8/10  . Essential hypertension, benign   . Urinary incontinence   . Bipolar disorder     Social History [name] reports that she has been smoking Cigarettes.  She has been smoking about 1.5 packs per day. She does not have any smokeless tobacco history on file. [name] reports that she  does not drink alcohol.  Review of Systems Still has intermittent anxiety and depression symptoms. No suicidal ideation voiced. States that she is not following with a behavioral health specialist at this point. Does see Dr. Modesto Charon.  Physical Examination Filed Vitals:   02/26/12 0857  BP: 126/76  Pulse: 78   Filed Weights   02/26/12 0857  Weight: 163 lb (73.936 kg)    Overweight woman in no acute distress.  HEENT: Conjunctiva and lids normal, oropharynx clear with poor dentition.  Neck: Supple, no elevated JVP or carotid bruits, no thyromegaly.  Lungs: Diminished but clear to auscultation, nonlabored breathing at rest.  Cardiac: Regular rate and rhythm, no S3 with 2/6 systolic murmur, no pericardial rub.  Abdomen: Soft, nontender, bowel sounds present, no guarding or rebound.  Extremities: No pitting edema, distal pulses 2+.    Problem List and Plan   CAD (coronary artery disease), native coronary artery Symptomatically stable on medical therapy. ECG reviewed and also stable. Cardiac testing was reassuring from last year. Continue observation.  Mixed hyperlipidemia Still on omega-3 supplements. Has not been on a statin, although last LDL was in the 80s. Followup lipid panel to be obtained.  TOBACCO ABUSE Still working on complete smoking cessation.  Essential hypertension, benign Blood pressure looks fairly good today.    Jonelle Sidle, M.D., F.A.C.C.

## 2012-02-26 NOTE — Patient Instructions (Addendum)
Your physician recommends that you schedule a follow-up appointment in: 6 MONTHS WITH SM  Your physician recommends that you return for lab work in: THIS WEEK (LIVER,LIPID) SLIPS GIVEN

## 2012-02-26 NOTE — Assessment & Plan Note (Signed)
Still on omega-3 supplements. Has not been on a statin, although last LDL was in the 80s. Followup lipid panel to be obtained.

## 2012-02-26 NOTE — Assessment & Plan Note (Signed)
Symptomatically stable on medical therapy. ECG reviewed and also stable. Cardiac testing was reassuring from last year. Continue observation.

## 2012-03-04 LAB — LIPID PANEL
HDL: 31 mg/dL — ABNORMAL LOW (ref >39)
LDL Cholesterol: 85 mg/dL (ref 0–99)
Triglycerides: 216 mg/dL — ABNORMAL HIGH (ref <150)

## 2012-03-04 LAB — HEPATIC FUNCTION PANEL
Albumin: 4.3 g/dL (ref 3.5–5.2)
Indirect Bilirubin: 0.3 mg/dL (ref 0.0–0.9)
Total Bilirubin: 0.4 mg/dL (ref 0.3–1.2)
Total Protein: 6.6 g/dL (ref 6.0–8.3)

## 2012-03-07 ENCOUNTER — Ambulatory Visit (HOSPITAL_COMMUNITY)
Admission: RE | Admit: 2012-03-07 | Discharge: 2012-03-07 | Disposition: A | Discharge status: Home / Self Care | Payer: Medicaid Other | Source: Ambulatory Visit | Attending: Family Medicine | Admitting: Family Medicine

## 2012-03-07 ENCOUNTER — Other Ambulatory Visit: Payer: Self-pay | Admitting: Family Medicine

## 2012-03-07 DIAGNOSIS — R1031 Right lower quadrant pain: Secondary | ICD-10-CM

## 2012-04-29 ENCOUNTER — Encounter: Payer: Self-pay | Admitting: Family Medicine

## 2012-04-29 DIAGNOSIS — J449 Chronic obstructive pulmonary disease, unspecified: Secondary | ICD-10-CM | POA: Insufficient documentation

## 2012-04-30 ENCOUNTER — Ambulatory Visit: Payer: Self-pay | Admitting: Physician Assistant

## 2012-05-02 ENCOUNTER — Ambulatory Visit (INDEPENDENT_AMBULATORY_CARE_PROVIDER_SITE_OTHER): Payer: Medicaid Other | Admitting: Physician Assistant

## 2012-05-02 ENCOUNTER — Encounter: Payer: Self-pay | Admitting: Physician Assistant

## 2012-05-02 VITALS — BP 134/80 | HR 76 | Temp 97.8°F | Resp 18 | Ht 62.0 in | Wt 164.0 lb

## 2012-05-02 DIAGNOSIS — J440 Chronic obstructive pulmonary disease with acute lower respiratory infection: Secondary | ICD-10-CM

## 2012-05-02 MED ORDER — AZITHROMYCIN 250 MG PO TABS: ORAL_TABLET | ORAL | Status: DC

## 2012-05-02 MED ORDER — ALBUTEROL SULFATE HFA 108 (90 BASE) MCG/ACT IN AERS: 2.0000 | INHALATION_SPRAY | RESPIRATORY_TRACT | Status: DC | PRN

## 2012-05-02 MED ORDER — PREDNISONE 20 MG PO TABS: ORAL_TABLET | ORAL | Status: DC

## 2012-05-02 NOTE — Progress Notes (Signed)
Patient ID: Melissa Garcia MRN: [id number], DOB: January 26, 1961, 52 y.o. Date of Encounter: 05/02/2012, 6:48 PM    Chief Complaint:  Chief Complaint  Patient presents with  . started last week with head cong and sneezing  Now c/o diff      HPI: 52 y.o. year old female reports that started last week with sneezing and runny nose. Now in chest. Has a lot of drainage in throat. Now getting nothing from nose. Throbbing headache yesterday. Pressure, congeston in head. Left cheek sore. Using inhaler.  Trying to quit smoking. Had decreased to one a day 2 weeks ago. Past week hasnt smoked at all.     Home Meds: Current Outpatient Prescriptions on File Prior to Visit  Medication Sig Dispense Refill  . aspirin EC 81 MG tablet Take 81 mg by mouth daily.        . Multiple Vitamins-Minerals (MULTIVITAMINS THER. W/MINERALS) TABS Take 1 tablet by mouth daily.        . nitroGLYCERIN (NITROSTAT) 0.4 MG SL tablet Place 1 tablet (0.4 mg total) under the tongue every 5 (five) minutes as needed for chest pain.  25 tablet  3  . Omega-3 Fatty Acids (FISH OIL PO) Take by mouth daily.      . pravastatin (PRAVACHOL) 20 MG tablet Take 20 mg by mouth at bedtime.      . valsartan (DIOVAN) 80 MG tablet Take 80 mg by mouth daily.       No current facility-administered medications on file prior to visit.    Allergies:  Allergies  Allergen Reactions  . Codeine   . Sulfonamide Derivatives Hives      Review of Systems: Constitutional: negative for chills, fever, night sweats, weight changes, or fatigue  HEENT: negative for vision changes, hearing loss. Cardiovascular: negative for chest pain or palpitations Respiratory: negative for hemoptysis Abdominal: negative for abdominal pain, nausea, vomiting, diarrhea, or constipation Dermatological: negative for rash Neurologic: negative for headache, dizziness, or syncope    Physical Exam: Blood pressure 134/80, pulse 76, temperature 97.8 F (36.6 C),  temperature source Oral, resp. rate 18, height 5\' 2"  (1.575 m), weight 164 lb (74.39 kg)., Body mass index is 29.99 kg/(m^2). General: Well developed, well nourished, in no acute distress. HEENT: Normocephalic, atraumatic, eyes without discharge, sclera non-icteric, nares are without discharge. Bilateral auditory canals clear, TM's are without perforation, pearly grey and translucent with reflective cone of light bilaterally. Oral cavity moist, posterior pharynx without exudate, erythema, peritonsillar abscess, Left maxillary sinus tender with percussion.   Neck: Supple. No thyromegaly. Full ROM. No lymphadenopathy. Lungs:  Mild wheezes throughout bilaterally. But, good air movement. Breathing is unlabored. Heart: RRR with S1 S2. No murmurs, rubs, or gallops appreciated. Msk:  Strength and tone normal for age. Extremities/Skin: Warm and dry. No clubbing or cyanosis. No edema. No rashes or suspicious lesions. Neuro: Alert and oriented X 3. Moves all extremities spontaneously. Gait is normal. CNII-XII grossly in tact. Psych:  Responds to questions appropriately with a normal affect.     ASSESSMENT AND PLAN:  52 y.o. year old female with  1. Bronchitis, chronic obstructive w acute bronchitis  - predniSONE (DELTASONE) 20 MG tablet; Take 3 daily for 2 days, then 2 daily for 2 days, then 1 daily for 2 days.  Dispense: 12 tablet; Refill: 0 - azithromycin (ZITHROMAX) 250 MG tablet; Take 2 daily on day one then 1 daily on days 2-5.  Dispense: 6 tablet; Refill: 0 - albuterol (PROVENTIL HFA;VENTOLIN HFA) 108 (  90 BASE) MCG/ACT inhaler; Inhale 2 puffs into the lungs every 4 (four) hours as needed for wheezing or shortness of breath. For shortness of breath  Dispense: 1 Inhaler; Refill: 2  2- Discussed smoking cessation. See HPI. Discussed Chantix. However, pt has h/o Bipolar and currently is on no meds for this. She reports that [facility] cahnged her doctor and new doctor did not continue her previous meds,  etc. She plans to find a new psych. I encouraged her to f/u with this. Pt agrees.  Murray Hodgkins [place], [place], [facility] 05/02/2012 6:48 PM

## 2012-05-14 ENCOUNTER — Telehealth: Payer: Self-pay | Admitting: Cardiology

## 2012-05-14 DIAGNOSIS — R0602 Shortness of breath: Secondary | ICD-10-CM

## 2012-05-14 DIAGNOSIS — I25810 Atherosclerosis of coronary artery bypass graft(s) without angina pectoris: Secondary | ICD-10-CM

## 2012-05-14 NOTE — Telephone Encounter (Signed)
ZOX:WRUEAV pt to clarify current sxs, pt denies chest pain/SOB, only numbness on side of her shoulder and face, pt states that she is feeling a fluttering or rapid heart beat, was advised by PCP 2 weeks ago that this may be due to anxiety, notes feeling of losing breathe at night until she props herself up, pt also complains of swelling in stomach and knee every morning, this nurse contacted [facility] via telephone and was given verbal orders to have pt complete the following:  1) ECHO DX SOB/CAD 2) 48 HOUR HOLTER MONITOR 3) FOLLOW UP NEXT WEEK WITH KL NP  Advised pt about instructions given, pt accepted all testing suggested, placed order in chart, contacted [facility] and was advised pt ECHO/holter monitor and follow up apt as follows:  Echo apt for 05-19-12 at 1pm at cardiac rehab Holter apt for same day at 2pm also in cardiac rehab Follow up with KL on 05-23-12 at 3pm  Pt accepted all apts and agreed if sxs worsen to please go to the ED for evaluation,

## 2012-05-14 NOTE — Telephone Encounter (Signed)
PT STATES THAT FOR THE PAST COUPLE WEEKS ON AND OFF, SHE HAS BEEN VERY TIRED AND OUT OF BREATH. SHE HAS BEEN SEEING SPOTS. HER FACE GETS NUMB AND SHE HAS SHOULDER PAINS.  PATIENT HAS SEEN PCP AND WAS DIAGNOSED WITH BRONCHITIS GIVEN MEDICATION AND HAS FINISHED MEDS WITH NO CHANGE IN HOW SHE HAS FELT

## 2012-05-19 ENCOUNTER — Ambulatory Visit (HOSPITAL_COMMUNITY)
Admission: RE | Admit: 2012-05-19 | Discharge: 2012-05-19 | Disposition: A | Discharge status: Home / Self Care | Payer: Medicaid Other | Source: Ambulatory Visit | Attending: Cardiology | Admitting: Cardiology

## 2012-05-19 DIAGNOSIS — R0602 Shortness of breath: Secondary | ICD-10-CM

## 2012-05-19 DIAGNOSIS — I25810 Atherosclerosis of coronary artery bypass graft(s) without angina pectoris: Secondary | ICD-10-CM

## 2012-05-19 DIAGNOSIS — J449 Chronic obstructive pulmonary disease, unspecified: Secondary | ICD-10-CM | POA: Insufficient documentation

## 2012-05-19 DIAGNOSIS — J4489 Other specified chronic obstructive pulmonary disease: Secondary | ICD-10-CM | POA: Insufficient documentation

## 2012-05-19 DIAGNOSIS — I2510 Atherosclerotic heart disease of native coronary artery without angina pectoris: Secondary | ICD-10-CM | POA: Insufficient documentation

## 2012-05-19 DIAGNOSIS — F17200 Nicotine dependence, unspecified, uncomplicated: Secondary | ICD-10-CM | POA: Insufficient documentation

## 2012-05-19 DIAGNOSIS — I10 Essential (primary) hypertension: Secondary | ICD-10-CM | POA: Insufficient documentation

## 2012-05-19 DIAGNOSIS — I359 Nonrheumatic aortic valve disorder, unspecified: Secondary | ICD-10-CM

## 2012-05-19 NOTE — Progress Notes (Signed)
*  PRELIMINARY RESULTS* Echocardiogram 48H Holter monitor has been performed.  Conrad [place] 05/19/2012, 1:42 PM

## 2012-05-19 NOTE — Progress Notes (Signed)
*  PRELIMINARY RESULTS* Echocardiogram 2D Echocardiogram has been performed.  Conrad [place] 05/19/2012, 1:43 PM

## 2012-05-23 ENCOUNTER — Ambulatory Visit (INDEPENDENT_AMBULATORY_CARE_PROVIDER_SITE_OTHER): Payer: Medicaid Other | Admitting: Adult Health

## 2012-05-23 ENCOUNTER — Encounter: Payer: Self-pay | Admitting: Adult Health

## 2012-05-23 VITALS — BP 112/64 | HR 90 | Ht 63.0 in | Wt 163.0 lb

## 2012-05-23 DIAGNOSIS — I10 Essential (primary) hypertension: Secondary | ICD-10-CM

## 2012-05-23 DIAGNOSIS — I2510 Atherosclerotic heart disease of native coronary artery without angina pectoris: Secondary | ICD-10-CM

## 2012-05-23 NOTE — Assessment & Plan Note (Signed)
Review of echocardiogram, and Holter monitor did not reveal any abnormalities to include LV dysfunction, or arrhythmias. Reassurance is given. We will see her again in one year. She is to continue risk management.

## 2012-05-23 NOTE — Patient Instructions (Addendum)
Your physician recommends that you schedule a follow-up appointment in: ONE YEAR WITH KL 

## 2012-05-23 NOTE — Assessment & Plan Note (Signed)
Pressure is well-controlled currently. No changes in her medication regimen. We will see her in one year

## 2012-05-23 NOTE — Progress Notes (Signed)
   HPI: Mrs. Melissa Garcia is a 52-year-old patient of Dr. Simona Huh we are following for ongoing assessment and management of CAD, mixed hyperlipidemia, hypertension, with history of ongoing tobacco abuse. The patient was last seen by Dr. Diona Browner in January 2014. The patient had complaints of chest pain shortness of breath and numbness on the left shoulder and face with fluttering and rapid heart rhythm on 05/14/2012. She was advised by her primary care physician this is related to anxiety. She was also treated for bronchitis Echocardiogram was ordered with a 48 hour Holter monitor. She is here to discuss results. Echocardiogram was completed on or 14 2014 demonstrating normal LV function with an EF of 55 to 60% Holter monitor was found to be normal.  Allergies  Allergen Reactions  . Codeine   . Sulfonamide Derivatives Hives    Current Outpatient Prescriptions  Medication Sig Dispense Refill  . albuterol (PROVENTIL HFA;VENTOLIN HFA) 108 (90 BASE) MCG/ACT inhaler Inhale 2 puffs into the lungs every 4 (four) hours as needed for wheezing or shortness of breath. For shortness of breath  1 Inhaler  2  . aspirin EC 81 MG tablet Take 81 mg by mouth daily.        . Multiple Vitamins-Minerals (MULTIVITAMINS THER. W/MINERALS) TABS Take 1 tablet by mouth daily.        . nitroGLYCERIN (NITROSTAT) 0.4 MG SL tablet Place 1 tablet (0.4 mg total) under the tongue every 5 (five) minutes as needed for chest pain.  25 tablet  3  . Omega-3 Fatty Acids (FISH OIL PO) Take by mouth daily.       No current facility-administered medications for this visit.    Past Medical History  Diagnosis Date  . Coronary atherosclerosis of native coronary artery     DES LAD and PTCA diagonal 8/10, LVEF 30% 8/10  . Myocardial infarct     AMI 8/10  . Essential hypertension, benign   . Urinary incontinence   . Bipolar disorder   . COPD (chronic obstructive pulmonary disease)   . Eczema     Past Surgical History  Procedure  Laterality Date  . Appendectomy    . Tubal ligation      ZOX:WRUEAV of systems complete and found to be negative unless listed above  PHYSICAL EXAM BP 112/64  Pulse 90  Ht 5\' 3"  (1.6 m)  Wt 163 lb (73.936 kg)  BMI 28.88 kg/m2  General: Well developed, well nourished, in no acute distress Head: Eyes PERRLA, No xanthomas.   Normal cephalic and atramatic  Lungs: Clear bilaterally to auscultation and percussion. Heart: HRRR S1 S2, without MRG.  Pulses are 2+ & equal.            No carotid bruit. No JVD.  No abdominal bruits. No femoral bruits. Abdomen: Bowel sounds are positive, abdomen soft and non-tender without masses or                  Hernia's noted. Msk:  Back normal, normal gait. Normal strength and tone for age. Extremities: No clubbing, cyanosis or edema.  DP +1 Neuro: Alert and oriented X 3. Psych:  Good affect, responds appropriately    ASSESSMENT AND PLAN

## 2012-05-23 NOTE — Progress Notes (Deleted)
Name: Melissa Garcia    DOB: 15-Mar-1960  Age: 52 y.o.  MR#: [id number]       PCP:  Frazier Richards, PA-C      Insurance: Payor: MEDICAID [place]  Plan: MEDICAID [place] ACCESS  Product Type: *No Product type*    CC:    Chief Complaint  Patient presents with  . Coronary Artery Disease  . Hypertension    VS Filed Vitals:   05/23/12 1443  BP: 112/64  Pulse: 90  Height: 5\' 3"  (1.6 m)  Weight: 163 lb (73.936 kg)    Weights Current Weight  05/23/12 163 lb (73.936 kg)  05/02/12 164 lb (74.39 kg)  03/07/12 164 lb (74.39 kg)    Blood Pressure  BP Readings from Last 3 Encounters:  05/23/12 112/64  05/02/12 134/80  03/07/12 110/70     Admit date:  (Not on file) Last encounter with RMR:  Visit date not found   Allergy Codeine and Sulfonamide derivatives  Current Outpatient Prescriptions  Medication Sig Dispense Refill  . albuterol (PROVENTIL HFA;VENTOLIN HFA) 108 (90 BASE) MCG/ACT inhaler Inhale 2 puffs into the lungs every 4 (four) hours as needed for wheezing or shortness of breath. For shortness of breath  1 Inhaler  2  . aspirin EC 81 MG tablet Take 81 mg by mouth daily.        . Multiple Vitamins-Minerals (MULTIVITAMINS THER. W/MINERALS) TABS Take 1 tablet by mouth daily.        . nitroGLYCERIN (NITROSTAT) 0.4 MG SL tablet Place 1 tablet (0.4 mg total) under the tongue every 5 (five) minutes as needed for chest pain.  25 tablet  3  . Omega-3 Fatty Acids (FISH OIL PO) Take by mouth daily.       No current facility-administered medications for this visit.    Discontinued Meds:    Medications Discontinued During This Encounter  Medication Reason  . pravastatin (PRAVACHOL) 20 MG tablet Error  . predniSONE (DELTASONE) 20 MG tablet Error  . azithromycin (ZITHROMAX) 250 MG tablet Error  . valsartan (DIOVAN) 80 MG tablet Error    Patient Active Problem List  Diagnosis  . BIPOLAR AFFECTIVE DISORDER, DEPRESSED  . TOBACCO ABUSE  . EMPHYSEMA  . CAD (coronary artery disease),  native coronary artery  . Essential hypertension, benign  . Mixed hyperlipidemia  . COPD (chronic obstructive pulmonary disease)  . Eczema    LABS    Component Value Date/Time   NA 137 11/01/2010 0421   NA 138 05/26/2009 2222   NA 135 10/09/2008 0159   K 3.7 11/01/2010 0421   K 3.6 05/26/2009 2222   K 3.4* 10/09/2008 0159   CL 102 11/01/2010 0421   CL 107 05/26/2009 2222   CL 103 10/09/2008 0159   CO2 26 11/01/2010 0421   CO2 29 05/26/2009 2222   CO2 28 10/09/2008 0159   GLUCOSE 107* 11/01/2010 0421   GLUCOSE 94 05/26/2009 2222   GLUCOSE 97 10/09/2008 0159   BUN 4* 11/01/2010 0421   BUN 4* 05/26/2009 2222   BUN 6 10/09/2008 0159   CREATININE 0.67 11/01/2010 0421   CREATININE 0.64 05/26/2009 2222   CREATININE 0.75 10/09/2008 0159   CALCIUM 9.4 11/01/2010 0421   CALCIUM 8.8 05/26/2009 2222   CALCIUM 8.8 10/09/2008 0159   GFRNONAA >60 11/01/2010 0421   GFRNONAA >60 05/26/2009 2222   GFRNONAA >60 10/09/2008 0159   GFRAA >60 11/01/2010 0421   GFRAA  Value: >60        The  eGFR has been calculated using the MDRD equation. This calculation has not been validated in all clinical situations. eGFR's persistently <60 mL/min signify possible Chronic Kidney Disease. 05/26/2009 2222   GFRAA  Value: >60        The eGFR has been calculated using the MDRD equation. This calculation has not been validated in all clinical situations. eGFR's persistently <60 mL/min signify possible Chronic Kidney Disease. 10/09/2008 0159   CMP     Component Value Date/Time   NA 137 11/01/2010 0421   K 3.7 11/01/2010 0421   CL 102 11/01/2010 0421   CO2 26 11/01/2010 0421   GLUCOSE 107* 11/01/2010 0421   BUN 4* 11/01/2010 0421   CREATININE 0.67 11/01/2010 0421   CALCIUM 9.4 11/01/2010 0421   PROT 6.6 03/04/2012 0840   ALBUMIN 4.3 03/04/2012 0840   AST 15 03/04/2012 0840   ALT 13 03/04/2012 0840   ALKPHOS 59 03/04/2012 0840   BILITOT 0.4 03/04/2012 0840   GFRNONAA >60 11/01/2010 0421   GFRAA >60 11/01/2010 0421       Component Value Date/Time   WBC  5.5 11/01/2010 0421   WBC 5.9 05/26/2009 2222   WBC 7.2 10/09/2008 0159   HGB 12.9 11/01/2010 0421   HGB 11.9* 05/26/2009 2222   HGB 12.1 10/09/2008 0159   HCT 37.4 11/01/2010 0421   HCT 32.7* 05/26/2009 2222   HCT 34.2* 10/09/2008 0159   MCV 98.2 11/01/2010 0421   MCV 93.5 05/26/2009 2222   MCV 97.9 10/09/2008 0159    Lipid Panel     Component Value Date/Time   CHOL 159 03/04/2012 0840   TRIG 216* 03/04/2012 0840   HDL 31* 03/04/2012 0840   CHOLHDL 5.1 03/04/2012 0840   VLDL 43* 03/04/2012 0840   LDLCALC 85 03/04/2012 0840    ABG No results found for this basename: phart, pco2, pco2art, po2, po2art, hco3, tco2, acidbasedef, o2sat     No results found for this basename: TSH   BNP (last 3 results) No results found for this basename: PROBNP,  in the last 8760 hours Cardiac Panel (last 3 results) No results found for this basename: CKTOTAL, CKMB, TROPONINI, RELINDX,  in the last 72 hours  Iron/TIBC/Ferritin No results found for this basename: iron, tibc, ferritin     EKG Orders placed in visit on 05/23/12  . EKG 12-LEAD     Prior Assessment and Plan Problem List as of 05/23/2012     ICD-9-CM   BIPOLAR AFFECTIVE DISORDER, DEPRESSED   TOBACCO ABUSE   Last Assessment & Plan   02/26/2012 Office Visit Written 02/26/2012  9:18 AM by Jonelle Sidle, MD     Still working on complete smoking cessation.    EMPHYSEMA   CAD (coronary artery disease), native coronary artery   Last Assessment & Plan   02/26/2012 Office Visit Written 02/26/2012  9:17 AM by Jonelle Sidle, MD     Symptomatically stable on medical therapy. ECG reviewed and also stable. Cardiac testing was reassuring from last year. Continue observation.    Essential hypertension, benign   Last Assessment & Plan   02/26/2012 Office Visit Written 02/26/2012  9:18 AM by Jonelle Sidle, MD     Blood pressure looks fairly good today.    Mixed hyperlipidemia   Last Assessment & Plan   02/26/2012 Office Visit Written 02/26/2012  9:18  AM by Jonelle Sidle, MD     Still on omega-3 supplements. Has not been on a statin, although last  LDL was in the 80s. Followup lipid panel to be obtained.    COPD (chronic obstructive pulmonary disease)   Eczema       Imaging: No results found.

## 2012-05-24 NOTE — Procedures (Signed)
Melissa Garcia, Melissa Garcia               ACCOUNT NO.:  [id number]  MEDICAL RECORD NO.:  [id number]  LOCATION:  CARDIOPU                      FACILITY:  [facility]  PHYSICIAN:  Gerrit Friends. Dietrich Pates, MD, FACCDATE OF BIRTH:  [birth year]  DATE OF PROCEDURE:  05/19/2012 DATE OF DISCHARGE:  05/19/2012                               HOLTER MONITOR   REFERRING PHYSICIAN:  Jonelle Sidle, MD  CLINICAL DATA:  A 52 year old woman with dyspnea.  1. Continuous electrocardiographic recording was maintained for 48     hours, during which, the predominant rhythm was normal sinus with     sinus arrhythmia, modest sinus bradycardia to a minimal rate of 55     bpm and some sinus tachycardia to a peak rate of 120 bpm. 2. No ventricular ectopy was identified.  A minimal number of PACs     occurred at an average rate of 1 per hour.  No significant     arrhythmias occurred. 3. No significant ST-segment elevation or depression was identified. 4. No diary of activity was returned.  By verbal report, the patient     experienced an episode of numbness in her arms and shoulders, a     separate episode of chest pain and an episode of "spots", which I     presumed represented a visual abnormality.  As noted above, no     arrhythmias occurred and thus, no arrhythmias could have been     associated with these symptoms.     Gerrit Friends. Dietrich Pates, MD, [facility]     RMR/MEDQ  D:  05/24/2012  T:  05/24/2012  Job:  [id number]

## 2012-05-30 ENCOUNTER — Emergency Department (HOSPITAL_COMMUNITY)
Admission: EM | Admit: 2012-05-30 | Discharge: 2012-05-31 | Disposition: A | Discharge status: Home / Self Care | Payer: Medicaid Other | Attending: Emergency Medicine | Admitting: Emergency Medicine

## 2012-05-30 ENCOUNTER — Encounter (HOSPITAL_COMMUNITY): Payer: Self-pay | Admitting: *Deleted

## 2012-05-30 ENCOUNTER — Ambulatory Visit: Payer: Self-pay | Admitting: Family Medicine

## 2012-05-30 DIAGNOSIS — J441 Chronic obstructive pulmonary disease with (acute) exacerbation: Secondary | ICD-10-CM | POA: Insufficient documentation

## 2012-05-30 DIAGNOSIS — R059 Cough, unspecified: Secondary | ICD-10-CM | POA: Insufficient documentation

## 2012-05-30 DIAGNOSIS — R062 Wheezing: Secondary | ICD-10-CM

## 2012-05-30 DIAGNOSIS — Z872 Personal history of diseases of the skin and subcutaneous tissue: Secondary | ICD-10-CM | POA: Insufficient documentation

## 2012-05-30 DIAGNOSIS — F17200 Nicotine dependence, unspecified, uncomplicated: Secondary | ICD-10-CM | POA: Insufficient documentation

## 2012-05-30 DIAGNOSIS — J029 Acute pharyngitis, unspecified: Secondary | ICD-10-CM | POA: Insufficient documentation

## 2012-05-30 DIAGNOSIS — Z7982 Long term (current) use of aspirin: Secondary | ICD-10-CM | POA: Insufficient documentation

## 2012-05-30 DIAGNOSIS — I10 Essential (primary) hypertension: Secondary | ICD-10-CM | POA: Insufficient documentation

## 2012-05-30 DIAGNOSIS — R05 Cough: Secondary | ICD-10-CM | POA: Insufficient documentation

## 2012-05-30 DIAGNOSIS — I2510 Atherosclerotic heart disease of native coronary artery without angina pectoris: Secondary | ICD-10-CM | POA: Insufficient documentation

## 2012-05-30 DIAGNOSIS — I252 Old myocardial infarction: Secondary | ICD-10-CM | POA: Insufficient documentation

## 2012-05-30 DIAGNOSIS — Z79899 Other long term (current) drug therapy: Secondary | ICD-10-CM | POA: Insufficient documentation

## 2012-05-30 DIAGNOSIS — R042 Hemoptysis: Secondary | ICD-10-CM | POA: Insufficient documentation

## 2012-05-30 DIAGNOSIS — Z8659 Personal history of other mental and behavioral disorders: Secondary | ICD-10-CM | POA: Insufficient documentation

## 2012-05-30 DIAGNOSIS — R0602 Shortness of breath: Secondary | ICD-10-CM

## 2012-05-30 MED ORDER — IPRATROPIUM BROMIDE 0.02 % IN SOLN
0.5000 mg | Freq: Once | RESPIRATORY_TRACT | Status: AC
  Administered 2012-05-31: 0.5 mg via RESPIRATORY_TRACT
  Filled 2012-05-30: qty 2.5

## 2012-05-30 MED ORDER — ALBUTEROL SULFATE (5 MG/ML) 0.5% IN NEBU
2.5000 mg | INHALATION_SOLUTION | Freq: Once | RESPIRATORY_TRACT | Status: AC
  Administered 2012-05-30: 2.5 mg via RESPIRATORY_TRACT
  Filled 2012-05-30: qty 0.5

## 2012-05-30 MED ORDER — ALBUTEROL SULFATE (5 MG/ML) 0.5% IN NEBU
2.5000 mg | INHALATION_SOLUTION | Freq: Once | RESPIRATORY_TRACT | Status: AC
  Administered 2012-05-31: 2.5 mg via RESPIRATORY_TRACT
  Filled 2012-05-30: qty 0.5

## 2012-05-30 NOTE — ED Provider Notes (Signed)
History  This chart was scribed for [employer]. Colon Branch, MD by Greggory Stallion, ED Scribe and Bennett Scrape, ED Scribe. This patient was seen in room APA02/APA02 and the patient's care was started at 11:04 PM.   CSN: [id number]  Arrival date & time 05/30/12  2218      Chief Complaint  Patient presents with  . Shortness of Breath  . Hemoptysis    The history is provided by the patient. No language interpreter was used.    Melissa Garcia is a 52 y.o. female brought in by ambulance who presents to the Emergency Department complaining of gradual onset, gradually worsening, constant SOB that started 2-3 days ago with associated productive cough, sore throat, and chills. Pt states she uses albuterol inhaler with no improvement. She was given an albuterol breathing treatment en route with improvement. She denies fever and diarrhea as associated symptoms.   PCP  Allayne Butcher   Past Medical History  Diagnosis Date  . Coronary atherosclerosis of native coronary artery     DES LAD and PTCA diagonal 8/10, LVEF 30% 8/10  . Myocardial infarct     AMI 8/10  . Essential hypertension, benign   . Urinary incontinence   . Bipolar disorder   . COPD (chronic obstructive pulmonary disease)   . Eczema     Past Surgical History  Procedure Laterality Date  . Appendectomy    . Tubal ligation      Family History  Problem Relation Age of Onset  . Coronary artery disease    . Hypertension    . Cancer Father     lung    History  Substance Use Topics  . Smoking status: Current Some Day Smoker -- 1.50 packs/day    Types: Cigarettes  . Smokeless tobacco: Not on file  . Alcohol Use: No    OB History   Grav Para Term Preterm Abortions TAB SAB Ect Mult Living                  Review of Systems  Constitutional: Positive for chills. Negative for fever.       10 systems reviewed and are negative for acute change except as noted in the HPI  HENT: Positive for sore throat. Negative for  congestion.   Eyes: Negative for discharge and redness.  Respiratory: Positive for cough (productive) and shortness of breath.   Cardiovascular: Negative for chest pain.  Gastrointestinal: Negative for vomiting and abdominal pain.  Musculoskeletal: Negative for back pain.  Skin: Negative for rash.  Neurological: Negative for syncope, numbness and headaches.  Psychiatric/Behavioral:       No behavior change    Allergies  Codeine and Sulfonamide derivatives  Home Medications   Current Outpatient Rx  Name  Route  Sig  Dispense  Refill  . albuterol (PROVENTIL HFA;VENTOLIN HFA) 108 (90 BASE) MCG/ACT inhaler   Inhalation   Inhale 2 puffs into the lungs every 4 (four) hours as needed for wheezing or shortness of breath. For shortness of breath   1 Inhaler   2   . aspirin EC 81 MG tablet   Oral   Take 81 mg by mouth daily.           . Omega-3 Fatty Acids (FISH OIL PO)   Oral   Take 1 capsule by mouth 2 (two) times daily.          . Multiple Vitamins-Minerals (MULTIVITAMINS THER. W/MINERALS) TABS   Oral   Take 1 tablet  by mouth daily.           . nitroGLYCERIN (NITROSTAT) 0.4 MG SL tablet   Sublingual   Place 1 tablet (0.4 mg total) under the tongue every 5 (five) minutes as needed for chest pain.   25 tablet   3     Triage Vitals: BP 135/80  Pulse 108  Temp(Src) 98.5 F (36.9 C) (Oral)  Resp 24  Ht 5\' 3"  (1.6 m)  Wt 162 lb (73.483 kg)  BMI 28.7 kg/m2  SpO2 93%  Physical Exam  Nursing note and vitals reviewed. Constitutional: She is oriented to person, place, and time. She appears well-developed and well-nourished. No distress.  HENT:  Head: Normocephalic and atraumatic.  Right Ear: Tympanic membrane normal.  Left Ear: Tympanic membrane normal.  Mouth/Throat: Uvula is midline and oropharynx is clear and moist.  Eyes: EOM are normal.  Neck: Neck supple. No tracheal deviation present.  Cardiovascular: Normal rate, regular rhythm and normal heart sounds.   Exam reveals no gallop and no friction rub.   No murmur heard. Pulmonary/Chest: Effort normal. No respiratory distress. She has wheezes (diffuse, expiratory).  Musculoskeletal: Normal range of motion.  Neurological: She is alert and oriented to person, place, and time.  Skin: Skin is warm and dry.  Psychiatric: She has a normal mood and affect. Her behavior is normal.    ED Course  Procedures (including critical care time)  DIAGNOSTIC STUDIES: Oxygen Saturation is 92% on RA, adequate by my interpretation.    COORDINATION OF CARE: 11:20 PM-Discussed treatment plan which includes  Medications  albuterol (PROVENTIL) (5 MG/ML) 0.5% nebulizer solution 2.5 mg (not administered)  ipratropium (ATROVENT) nebulizer solution 0.5 mg (not administered)  albuterol (PROVENTIL) (5 MG/ML) 0.5% nebulizer solution 2.5 mg (2.5 mg Nebulization Given 05/30/12 2249)    with pt at bedside and pt agreed to plan.    1208 Wheezing better after albuterol nebulization. Cough is intermittent. MDM  Patient with shortness of breath and wheezing. Given albuterol nebulizer and prednisone with improvement. Pt stable in ED with no significant deterioration in condition.The patient appears reasonably screened and/or stabilized for discharge and I doubt any other medical condition or other [facility] requiring further screening, evaluation, or treatment in the ED at this time prior to discharge.  I personally performed the services described in this documentation, which was scribed in my presence. The recorded information has been reviewed and considered.   MDM Reviewed: nursing note and vitals           Nicoletta Dress. Colon Branch, MD 05/31/12 0010

## 2012-05-30 NOTE — ED Notes (Signed)
Given albuterol breathing tx per ems

## 2012-05-31 MED ORDER — DEXTROMETHORPHAN HBR 15 MG/5ML PO SYRP: 10.0000 mL | ORAL_SOLUTION | Freq: Four times a day (QID) | ORAL | Status: DC | PRN

## 2012-05-31 MED ORDER — PREDNISONE 10 MG PO TABS: 20.0000 mg | ORAL_TABLET | Freq: Every day | ORAL | Status: DC

## 2012-06-06 ENCOUNTER — Ambulatory Visit (INDEPENDENT_AMBULATORY_CARE_PROVIDER_SITE_OTHER): Payer: Medicaid Other | Admitting: Physician Assistant

## 2012-06-06 ENCOUNTER — Encounter: Payer: Self-pay | Admitting: Physician Assistant

## 2012-06-06 VITALS — BP 140/82 | HR 68 | Temp 97.5°F | Resp 18 | Ht 62.0 in | Wt 162.0 lb

## 2012-06-06 DIAGNOSIS — F17200 Nicotine dependence, unspecified, uncomplicated: Secondary | ICD-10-CM

## 2012-06-06 DIAGNOSIS — J209 Acute bronchitis, unspecified: Secondary | ICD-10-CM

## 2012-06-06 DIAGNOSIS — J438 Other emphysema: Secondary | ICD-10-CM

## 2012-06-06 DIAGNOSIS — J449 Chronic obstructive pulmonary disease, unspecified: Secondary | ICD-10-CM

## 2012-06-06 DIAGNOSIS — J029 Acute pharyngitis, unspecified: Secondary | ICD-10-CM

## 2012-06-06 MED ORDER — PREDNISONE 20 MG PO TABS: ORAL_TABLET | ORAL | Status: DC

## 2012-06-06 MED ORDER — VARENICLINE TARTRATE 1 MG PO TABS: 1.0000 mg | ORAL_TABLET | Freq: Two times a day (BID) | ORAL | Status: DC

## 2012-06-06 MED ORDER — AZITHROMYCIN 500 MG PO TABS: 500.0000 mg | ORAL_TABLET | Freq: Every day | ORAL | Status: DC

## 2012-06-06 MED ORDER — VARENICLINE TARTRATE 0.5 MG PO TABS: 0.5000 mg | ORAL_TABLET | Freq: Two times a day (BID) | ORAL | Status: DC

## 2012-06-06 NOTE — Progress Notes (Signed)
Patient ID: CLARENE CURRAN MRN: [id number], DOB: 09/21/1960, 52 y.o. Date of Encounter: 06/06/2012, 2:53 PM    Chief Complaint:  Chief Complaint  Patient presents with  . sick for over 1 week seen ER Friday  collasped at home  pers    rt ear draining,  urinary burning, prod cough with green secretions     HPI: 52 y.o. year old female here for eval of congestion. Went to ER 1 week ago secondary to severe cough and SOB. They gave her nebulizer treatments and sent her home with oral prednisone. Completed Prednisone yesterday. Coughing up thick, yellow-green phlegm. No mucus from nose. Throat is very sore. 2 kids at home have strep throat per pt. No fever, chills. No ear pain.     Home Meds: Current Outpatient Prescriptions on File Prior to Visit  Medication Sig Dispense Refill  . albuterol (PROVENTIL HFA;VENTOLIN HFA) 108 (90 BASE) MCG/ACT inhaler Inhale 2 puffs into the lungs every 4 (four) hours as needed for wheezing or shortness of breath. For shortness of breath  1 Inhaler  2  . aspirin EC 81 MG tablet Take 81 mg by mouth daily.        . Multiple Vitamins-Minerals (MULTIVITAMINS THER. W/MINERALS) TABS Take 1 tablet by mouth daily.        . Omega-3 Fatty Acids (FISH OIL PO) Take 1 capsule by mouth 2 (two) times daily.       Marland Kitchen dextromethorphan 15 MG/5ML syrup Take 10 mLs (30 mg total) by mouth 4 (four) times daily as needed for cough.  120 mL  0  . nitroGLYCERIN (NITROSTAT) 0.4 MG SL tablet Place 1 tablet (0.4 mg total) under the tongue every 5 (five) minutes as needed for chest pain.  25 tablet  3  . predniSONE (DELTASONE) 10 MG tablet Take 2 tablets (20 mg total) by mouth daily.  10 tablet  0   No current facility-administered medications on file prior to visit.    Allergies:  Allergies  Allergen Reactions  . Codeine Nausea And Vomiting    Vicodin/Percocet Specified= excessive vomiting  . Sulfonamide Derivatives Hives      Review of Systems: See HPI. Remainder of ROS  negative.   Physical Exam: Blood pressure 140/82, pulse 68, temperature 97.5 F (36.4 C), temperature source Oral, resp. rate 18, height 5\' 2"  (1.575 m), weight 162 lb (73.483 kg)., Body mass index is 29.62 kg/(m^2). General: Well developed, well nourished,WF. Appears in no acute distress. HEENT: Normocephalic, atraumatic, eyes without discharge, sclera non-icteric, nares are without discharge. Bilateral auditory canals clear, TM's are without perforation, pearly grey and translucent with reflective cone of light bilaterally. Oral cavity moist. Posterior pharynx with mod-severe erythema. No exudate, or peritonsillar abscess.  Neck: Supple. Right anterior cervical node tender with palpation.  Lungs: Mild wheezes throughout bilaterally.  Heart: Regular rhythm. No murmurs, rubs, or gallops. Msk:  Strength and tone normal for age. Extremities/Skin: Warm and dry. No clubbing or cyanosis. No edema. No rashes or suspicious lesions. Neuro: Alert and oriented X 3. Moves all extremities spontaneously. Gait is normal. CNII-XII grossly in tact. Psych:  Responds to questions appropriately with a normal affect.     ASSESSMENT AND PLAN:  52 y.o. year old female with  1. Acute bronchitis - azithromycin (ZITHROMAX) 500 MG tablet; Take 1 tablet (500 mg total) by mouth daily.  Dispense: 5 tablet; Refill: 0 - predniSONE (DELTASONE) 20 MG tablet; Take 3 daily for 2 days, then 2 daily for 2  days, then 1 daily for 2 days.  Dispense: 12 tablet; Refill: 0  2. Acute pharyngitis - azithromycin (ZITHROMAX) 500 MG tablet; Take 1 tablet (500 mg total) by mouth daily.  Dispense: 5 tablet; Refill: 0  3. COPD (chronic obstructive pulmonary disease)  4. EMPHYSEMA   5. TOBACCO ABUSE Says that prior to current illness, was already trying to quit smoking. Since sick, has not smoked at all for 2 weeks. Really wants to quit but feels she needs med to help decrease craving. We discussed that chart mentions h/o bipolar  disorder. She states her mood is stable. She is aware that Chantix may cause change in mood or behavior. If develops this, she is to stop Chantix immediately and f/u with me.  - varenicline (CHANTIX) 0.5 MG tablet; Take 1 tablet (0.5 mg total) by mouth 2 (two) times daily.  Dispense: 60 tablet; Refill: 0 - varenicline (CHANTIX CONTINUING MONTH PAK) 1 MG tablet; Take 1 tablet (1 mg total) by mouth 2 (two) times daily.  Dispense: 60 tablet; Refill: 2  She is to schedule f/u OV in 1 week to repeat exam and make sure wheezing resolves. .this  Signed, Shon Hale [place], [place], [facility] 06/06/2012 2:53 PM

## 2012-06-13 ENCOUNTER — Ambulatory Visit: Payer: Medicaid Other | Admitting: Physician Assistant

## 2012-06-14 ENCOUNTER — Encounter (HOSPITAL_COMMUNITY): Payer: Self-pay | Admitting: *Deleted

## 2012-06-14 ENCOUNTER — Emergency Department (HOSPITAL_COMMUNITY): Payer: Medicaid Other

## 2012-06-14 ENCOUNTER — Emergency Department (HOSPITAL_COMMUNITY)
Admission: EM | Admit: 2012-06-14 | Discharge: 2012-06-14 | Disposition: A | Discharge status: Home / Self Care | Payer: Medicaid Other | Attending: Emergency Medicine | Admitting: Emergency Medicine

## 2012-06-14 DIAGNOSIS — Z79899 Other long term (current) drug therapy: Secondary | ICD-10-CM | POA: Insufficient documentation

## 2012-06-14 DIAGNOSIS — R1310 Dysphagia, unspecified: Secondary | ICD-10-CM | POA: Insufficient documentation

## 2012-06-14 DIAGNOSIS — I252 Old myocardial infarction: Secondary | ICD-10-CM | POA: Insufficient documentation

## 2012-06-14 DIAGNOSIS — R05 Cough: Secondary | ICD-10-CM | POA: Insufficient documentation

## 2012-06-14 DIAGNOSIS — Z8659 Personal history of other mental and behavioral disorders: Secondary | ICD-10-CM | POA: Insufficient documentation

## 2012-06-14 DIAGNOSIS — R059 Cough, unspecified: Secondary | ICD-10-CM | POA: Insufficient documentation

## 2012-06-14 DIAGNOSIS — I10 Essential (primary) hypertension: Secondary | ICD-10-CM | POA: Insufficient documentation

## 2012-06-14 DIAGNOSIS — K2970 Gastritis, unspecified, without bleeding: Secondary | ICD-10-CM | POA: Insufficient documentation

## 2012-06-14 DIAGNOSIS — R1013 Epigastric pain: Secondary | ICD-10-CM | POA: Insufficient documentation

## 2012-06-14 DIAGNOSIS — K2100 Gastro-esophageal reflux disease with esophagitis, without bleeding: Secondary | ICD-10-CM | POA: Insufficient documentation

## 2012-06-14 DIAGNOSIS — I2510 Atherosclerotic heart disease of native coronary artery without angina pectoris: Secondary | ICD-10-CM | POA: Insufficient documentation

## 2012-06-14 DIAGNOSIS — J449 Chronic obstructive pulmonary disease, unspecified: Secondary | ICD-10-CM | POA: Insufficient documentation

## 2012-06-14 DIAGNOSIS — Z9089 Acquired absence of other organs: Secondary | ICD-10-CM | POA: Insufficient documentation

## 2012-06-14 DIAGNOSIS — Z7982 Long term (current) use of aspirin: Secondary | ICD-10-CM | POA: Insufficient documentation

## 2012-06-14 DIAGNOSIS — Z87891 Personal history of nicotine dependence: Secondary | ICD-10-CM | POA: Insufficient documentation

## 2012-06-14 DIAGNOSIS — J4489 Other specified chronic obstructive pulmonary disease: Secondary | ICD-10-CM | POA: Insufficient documentation

## 2012-06-14 DIAGNOSIS — Z872 Personal history of diseases of the skin and subcutaneous tissue: Secondary | ICD-10-CM | POA: Insufficient documentation

## 2012-06-14 DIAGNOSIS — K209 Esophagitis, unspecified without bleeding: Secondary | ICD-10-CM

## 2012-06-14 DIAGNOSIS — Z9851 Tubal ligation status: Secondary | ICD-10-CM | POA: Insufficient documentation

## 2012-06-14 LAB — TROPONIN I: Troponin I: <0.3 ng/mL (ref <0.30)

## 2012-06-14 LAB — CBC WITH DIFFERENTIAL/PLATELET
Basophils Absolute: 0 10*3/uL (ref 0.0–0.1)
Eosinophils Relative: 1 % (ref 0–5)
HCT: 40.7 % (ref 36.0–46.0)
Hemoglobin: 14.2 g/dL (ref 12.0–15.0)
Lymphocytes Relative: 12 % (ref 12–46)
Lymphs Abs: 1.7 10*3/uL (ref 0.7–4.0)
MCV: 95.3 fL (ref 78.0–100.0)
Monocytes Absolute: 0.7 10*3/uL (ref 0.1–1.0)
Monocytes Relative: 5 % (ref 3–12)
Neutro Abs: 12 10*3/uL — ABNORMAL HIGH (ref 1.7–7.7)
RDW: 13.5 % (ref 11.5–15.5)
WBC: 14.6 10*3/uL — ABNORMAL HIGH (ref 4.0–10.5)

## 2012-06-14 LAB — COMPREHENSIVE METABOLIC PANEL
BUN: 6 mg/dL (ref 6–23)
CO2: 27 mEq/L (ref 19–32)
Calcium: 9.5 mg/dL (ref 8.4–10.5)
Chloride: 100 mEq/L (ref 96–112)
Creatinine, Ser: 0.78 mg/dL (ref 0.50–1.10)
GFR calc Af Amer: >90 mL/min (ref >90)
GFR calc non Af Amer: >90 mL/min (ref >90)
Glucose, Bld: 87 mg/dL (ref 70–99)
Total Bilirubin: 0.5 mg/dL (ref 0.3–1.2)

## 2012-06-14 LAB — LIPASE, BLOOD: Lipase: 55 U/L (ref 11–59)

## 2012-06-14 MED ORDER — SUCRALFATE 1 GM/10ML PO SUSP: 1.0000 g | Freq: Four times a day (QID) | ORAL | Status: DC

## 2012-06-14 MED ORDER — NYSTATIN 100000 UNIT/ML MT SUSP: 500000.0000 [IU] | Freq: Four times a day (QID) | OROMUCOSAL | Status: DC

## 2012-06-14 MED ORDER — PANTOPRAZOLE SODIUM 20 MG PO TBEC: 20.0000 mg | DELAYED_RELEASE_TABLET | Freq: Every day | ORAL | Status: DC

## 2012-06-14 NOTE — ED Provider Notes (Signed)
History  This chart was scribed for Gilda Crease, MD by Jiles Prows, ED Scribe. The patient was seen in room APA07/APA07 and the patient's care was started at 7:04 PM.  CSN: [id number]  Arrival date & time 06/14/12  1849  Chief Complaint  Patient presents with  . Sore Throat    The history is provided by the patient and medical records. No language interpreter was used.   HPI Comments: Melissa Garcia is a 52 y.o. Female with COPD, coronary atherosclerosis, and a history of MI who presents to the Emergency Department complaining of moderate burning sore throat and epigastric pain upon swallowing for the past 2 days. The pain is aggravated by deep breathing. Associated symptoms include decreased food intake and productive cough.   She reports that she has no history of blockages in her esophagus or ulcer.  Pt was being treated for bronchitis and strep throat.  Her PCP did not order any x-rays.   Pt states she finished prescribed antibiotics and steroids on Thursday.  She also has an albuterol inhaler she uses as needed.  Pt has never had an endoscopic exam.  Pt denies headache, diaphoresis, fever, chills, nausea, vomiting, diarrhea, weakness, cough, SOB and any other pain.  PCP is [facility].  Past Medical History  Diagnosis Date  . Coronary atherosclerosis of native coronary artery     DES LAD and PTCA diagonal 8/10, LVEF 30% 8/10  . Myocardial infarct     AMI 8/10  . Essential hypertension, benign   . Urinary incontinence   . Bipolar disorder   . COPD (chronic obstructive pulmonary disease)   . Eczema     Past Surgical History  Procedure Laterality Date  . Appendectomy    . Tubal ligation      Family History  Problem Relation Age of Onset  . Coronary artery disease    . Hypertension    . Cancer Father     lung    History  Substance Use Topics  . Smoking status: Former Smoker -- 1.50 packs/day    Types: Cigarettes  . Smokeless tobacco: Former  Neurosurgeon    Quit date: 05/23/2012  . Alcohol Use: No    OB History   Grav Para Term Preterm Abortions TAB SAB Ect Mult Living                  Review of Systems  HENT: Positive for sore throat and trouble swallowing.   Gastrointestinal: Negative for nausea and vomiting.  Neurological: Negative for speech difficulty and weakness.  All other systems reviewed and are negative.    Allergies  Codeine and Sulfonamide derivatives  Home Medications   Current Outpatient Rx  Name  Route  Sig  Dispense  Refill  . albuterol (PROVENTIL HFA;VENTOLIN HFA) 108 (90 BASE) MCG/ACT inhaler   Inhalation   Inhale 2 puffs into the lungs every 4 (four) hours as needed for wheezing or shortness of breath. For shortness of breath   1 Inhaler   2   . aspirin EC 81 MG tablet   Oral   Take 81 mg by mouth daily.           Marland Kitchen azithromycin (ZITHROMAX) 500 MG tablet   Oral   Take 1 tablet (500 mg total) by mouth daily.   5 tablet   0   . dextromethorphan 15 MG/5ML syrup   Oral   Take 10 mLs (30 mg total) by mouth 4 (four) times  daily as needed for cough.   120 mL   0   . Multiple Vitamins-Minerals (MULTIVITAMINS THER. W/MINERALS) TABS   Oral   Take 1 tablet by mouth daily.           Marland Kitchen EXPIRED: nitroGLYCERIN (NITROSTAT) 0.4 MG SL tablet   Sublingual   Place 1 tablet (0.4 mg total) under the tongue every 5 (five) minutes as needed for chest pain.   25 tablet   3   . Omega-3 Fatty Acids (FISH OIL PO)   Oral   Take 1 capsule by mouth 2 (two) times daily.          . predniSONE (DELTASONE) 10 MG tablet   Oral   Take 2 tablets (20 mg total) by mouth daily.   10 tablet   0   . predniSONE (DELTASONE) 20 MG tablet      Take 3 daily for 2 days, then 2 daily for 2 days, then 1 daily for 2 days.   12 tablet   0   . varenicline (CHANTIX CONTINUING MONTH PAK) 1 MG tablet   Oral   Take 1 tablet (1 mg total) by mouth 2 (two) times daily.   60 tablet   2   . varenicline (CHANTIX) 0.5 MG  tablet   Oral   Take 1 tablet (0.5 mg total) by mouth 2 (two) times daily.   60 tablet   0     BP 129/83  Pulse 103  Temp(Src) 98.3 F (36.8 C) (Oral)  Resp 22  Ht 5\' 3"  (1.6 m)  Wt 162 lb (73.483 kg)  BMI 28.7 kg/m2  SpO2 97%  Physical Exam  Constitutional: She is oriented to person, place, and time. She appears well-developed and well-nourished. No distress.  HENT:  Head: Normocephalic and atraumatic.  Right Ear: Hearing normal.  Left Ear: Hearing normal.  Nose: Nose normal.  Mouth/Throat: Oropharynx is clear and moist and mucous membranes are normal.  White patches noted on soft palate.  Eyes: Conjunctivae and EOM are normal. Pupils are equal, round, and reactive to light.  Neck: Normal range of motion. Neck supple.  Cardiovascular: Regular rhythm, S1 normal and S2 normal.  Exam reveals no gallop and no friction rub.   No murmur heard. Pulmonary/Chest: Effort normal and breath sounds normal. No respiratory distress. She exhibits no tenderness.  Abdominal: Soft. Normal appearance and bowel sounds are normal. There is no hepatosplenomegaly. There is no tenderness. There is no rebound, no guarding, no tenderness at McBurney's point and negative Murphy's sign. No hernia.  Musculoskeletal: Normal range of motion.  Neurological: She is alert and oriented to person, place, and time. She has normal strength. No cranial nerve deficit or sensory deficit. Coordination normal. GCS eye subscore is 4. GCS verbal subscore is 5. GCS motor subscore is 6.  Skin: Skin is warm, dry and intact. No rash noted. No cyanosis.  Psychiatric: She has a normal mood and affect. Her speech is normal and behavior is normal. Thought content normal.    ED Course  Procedures (including critical care time) DIAGNOSTIC STUDIES: Oxygen Saturation is 97% on RA, adequate by my interpretation.    EKG:  Date: 06/14/2012  Rate: 89  Rhythm: normal sinus rhythm  QRS Axis: normal  Intervals: normal  ST/T Wave  abnormalities: normal  Conduction Disutrbances:none      COORDINATION OF CARE: 7:07 PM - Discussed ED treatment with pt at bedside and pt agrees.     Labs Reviewed  CBC  WITH DIFFERENTIAL - Abnormal; Notable for the following:    WBC 14.6 (*)    Neutrophils Relative 82 (*)    Neutro Abs 12.0 (*)    All other components within normal limits  COMPREHENSIVE METABOLIC PANEL  LIPASE, BLOOD  TROPONIN I   Dg Chest 2 View  06/14/2012  *RADIOLOGY REPORT*  Clinical Data: Cough and shortness of breath.  CHEST - 2 VIEW  Comparison: 02/11/2011  Findings: Lungs are adequately inflated without focal consolidation or effusion.  The cardiomediastinal silhouette and remainder of the exam is unchanged.  IMPRESSION: No acute cardiopulmonary disease.   Original Report Authenticated By: Elberta Fortis, M.D.      Diagnosis: 1. Bronchitis 2. Pharyngitis 2. GERD/gastritis/esophagitis    MDM  Patient comes to the ER for evaluation of sore throat and increased reflux and indigestion symptoms. Patient reports previously using Zantac for this. She says that she has been having increased symptoms of discomfort, burning sensation in the epigastric region recently. She has now started to have a sensation of not in the throat which is uncomfortable when she swallows.  Symptoms do not seem cardiac in nature. Her EKG did not show any acute abnormalities and troponin was negative.  She continues to have cough and chest congestion. Chest x-ray, however was unremarkable.  Patient has been on prednisone, cannot rule out candidal esophagitis as a cause of the symptoms will treat empirically.  Additionally, symptoms sound more consistent with reflux and will need GI evaluation. Patient now complaining of pain and feeling like food getting caught when she swallows. No esophageal impaction, however. Will Treat Protonix instead of ranitidine, add Carafate and followup with GI. Return if symptoms worsen.    I personally  performed the services described in this documentation, which was scribed in my presence. The recorded information has been reviewed and is accurate.    Gilda Crease, MD 06/14/12 2032

## 2012-06-14 NOTE — ED Notes (Addendum)
Pt presents with a sore throat, states several weeks ago she was seen here and given prednisone, then she when to her doctor and was treated for strep throat, states pain no better and states pain with swallowing. Airway clear, no other complaints. States that a PPI has helped.

## 2012-06-14 NOTE — ED Notes (Signed)
Pt reports being treated for bronchitis and strep throat for a couple weeks.  Reports completing course of antibiotics and prednisone.  Reports continued sore throat and epigastric pain worsening over past 2 days.

## 2012-06-16 ENCOUNTER — Encounter: Payer: Self-pay | Admitting: Physician Assistant

## 2012-06-16 ENCOUNTER — Ambulatory Visit (INDEPENDENT_AMBULATORY_CARE_PROVIDER_SITE_OTHER): Payer: Medicaid Other | Admitting: Physician Assistant

## 2012-06-16 VITALS — BP 122/76 | HR 104 | Temp 98.1°F | Resp 18 | Wt 155.0 lb

## 2012-06-16 DIAGNOSIS — A499 Bacterial infection, unspecified: Secondary | ICD-10-CM

## 2012-06-16 DIAGNOSIS — B9689 Other specified bacterial agents as the cause of diseases classified elsewhere: Secondary | ICD-10-CM

## 2012-06-16 DIAGNOSIS — J988 Other specified respiratory disorders: Secondary | ICD-10-CM

## 2012-06-16 MED ORDER — LEVOFLOXACIN 750 MG PO TABS: 750.0000 mg | ORAL_TABLET | Freq: Every day | ORAL | Status: DC

## 2012-06-16 NOTE — Progress Notes (Signed)
Patient ID: Melissa Garcia MRN: [id number], DOB: Mar 03, 1960, 52 y.o. Date of Encounter: 06/16/2012, 10:53 AM    Chief Complaint:  Chief Complaint  Patient presents with  . 1 week f/u from ED visit  . tick bite on left lower leg     HPI: 52 y.o. year old female here for f/u. She had OV with me 06/06/12. One week prior to that she had gone to ER and they treated her with Neb x 2 and oral prednisone. She then f/u with me 06/06/12. I Rxed ZPack, Pred Pack, Chantix. She has quit smoking since that OV. She still has a lot of phlegm in throat-thick yellow green. Not having any more SOB or wheezing. Still has some h/a and pressure behind ears.   06/14/12 went to ER-Dx with Gastritis, Esophagitis, Dysphagia. Labs were done. Cardiac Enz neg, EKG nml.CXRay negative. They treated her with Protonix, Carafate, Nystatin Suspension. Says she just got started on these yesterday.      Home Meds: See attached medication section for any medications that were entered at today's visit. The computer does not put those onto this list.The following list is a list of meds entered prior to today's visit.   Current Outpatient Prescriptions on File Prior to Visit  Medication Sig Dispense Refill  . albuterol (PROVENTIL HFA;VENTOLIN HFA) 108 (90 BASE) MCG/ACT inhaler Inhale 2 puffs into the lungs every 4 (four) hours as needed for wheezing or shortness of breath. For shortness of breath  1 Inhaler  2  . aspirin EC 81 MG tablet Take 81 mg by mouth daily.        . Multiple Vitamins-Minerals (MULTIVITAMINS THER. W/MINERALS) TABS Take 1 tablet by mouth daily.        Marland Kitchen nystatin (MYCOSTATIN) 100000 UNIT/ML suspension Take 5 mLs (500,000 Units total) by mouth 4 (four) times daily.  140 mL  0  . Omega-3 Fatty Acids (FISH OIL PO) Take 1 capsule by mouth 2 (two) times daily.       . pantoprazole (PROTONIX) 20 MG tablet Take 1 tablet (20 mg total) by mouth daily.  30 tablet  0  . sucralfate (CARAFATE) 1 GM/10ML suspension Take 10  mLs (1 g total) by mouth 4 (four) times daily.  420 mL  0  . nitroGLYCERIN (NITROSTAT) 0.4 MG SL tablet Place 1 tablet (0.4 mg total) under the tongue every 5 (five) minutes as needed for chest pain.  25 tablet  3  . ranitidine (ZANTAC) 75 MG tablet Take 75 mg by mouth daily.      . varenicline (CHANTIX) 0.5 MG tablet Take 1 tablet (0.5 mg total) by mouth 2 (two) times daily.  60 tablet  0   No current facility-administered medications on file prior to visit.    Allergies:  Allergies  Allergen Reactions  . Codeine Nausea And Vomiting    Vicodin/Percocet Specified= excessive vomiting  . Sulfonamide Derivatives Hives      Review of Systems: See HPI for pertinent ROS. All other ROS negative.    Physical Exam: Blood pressure 122/76, pulse 104, temperature 98.1 F (36.7 C), temperature source Oral, resp. rate 18, weight 155 lb (70.308 kg)., Body mass index is 27.46 kg/(m^2). General: WNWD WF.  Appears in no acute distress. HEENT: Normocephalic, atraumatic, eyes without discharge, sclera non-icteric, nares are without discharge. Bilateral auditory canals clear, TM's are without perforation,Left TM has area of dullness. Right TM is clear, nml.  Oral cavity moist, posterior pharynx without exudate, erythema, peritonsillar abscess,  or post nasal drip.Sinuses with no tenderness with percussion.  Neck: Supple. No thyromegaly. Full ROM. No lymphadenopathy. Lungs: Clear bilaterally to auscultation without wheezes, rales, or rhonchi. Breathing is unlabored.Wheezes have resolved compared to prior exam. Heart: Regular rhythm. No murmurs, rubs, or gallops. Msk:  Strength and tone normal for age. Extremities/Skin: Warm and dry. No clubbing or cyanosis. No edema. No rashes or suspicious lesions. Neuro: Alert and oriented X 3. Moves all extremities spontaneously. Gait is normal. CNII-XII grossly in tact. Psych:  Responds to questions appropriately with a normal affect.     ASSESSMENT AND PLAN:  52  y.o. year old female with  1. Bacterial respiratory infection - levofloxacin (LEVAQUIN) 750 MG tablet; Take 1 tablet (750 mg total) by mouth daily.  Dispense: 7 tablet; Refill: 0 Will treat with stronger antibiotic for residual infection. If phlegm in throat and ear congeston/fullness do not resolve, f/u.   2. I reviewed ER records. Recommend that she complete the treatment the prescribed for the gastritis/esophagitis/dysphagia evaluated at the ER. She just started these medications yesterday-has not had time to be effective treatment yet. F/U if symptoms do not resolve.   [address] [place], [place], [facility] 06/16/2012 10:53 AM

## 2012-06-19 ENCOUNTER — Telehealth: Payer: Self-pay | Admitting: Family Medicine

## 2012-06-19 NOTE — Telephone Encounter (Signed)
Unable to swallow antibiotic for several days.  When started to take after first pill ( took yesterday) caused severe abdominal pain and vomiting.  Does not think she can take this medication.  Please advise?

## 2012-06-20 MED ORDER — CEFDINIR 300 MG PO CAPS: 300.0000 mg | ORAL_CAPSULE | Freq: Two times a day (BID) | ORAL | Status: DC

## 2012-06-20 NOTE — Telephone Encounter (Signed)
At OV I Rxed Levaquin. She can stop this given adverse effects.  Change to Omnicef 300mg  one po BID for 10 days  Send in RX: #20 / 0.

## 2012-06-20 NOTE — Telephone Encounter (Signed)
Pt home called .  Left mess that medication is changing and has been sent to pharmacy

## 2012-06-28 ENCOUNTER — Other Ambulatory Visit: Payer: Self-pay | Admitting: Physician Assistant

## 2012-07-01 NOTE — Telephone Encounter (Signed)
Medication refilled per protocol. 

## 2012-08-12 ENCOUNTER — Other Ambulatory Visit: Payer: Self-pay | Admitting: Physician Assistant

## 2012-08-12 DIAGNOSIS — Z139 Encounter for screening, unspecified: Secondary | ICD-10-CM

## 2012-08-15 ENCOUNTER — Ambulatory Visit (HOSPITAL_COMMUNITY)
Admission: RE | Admit: 2012-08-15 | Discharge: 2012-08-15 | Disposition: A | Discharge status: Home / Self Care | Payer: Medicaid Other | Source: Ambulatory Visit | Attending: Physician Assistant | Admitting: Physician Assistant

## 2012-08-15 DIAGNOSIS — Z1231 Encounter for screening mammogram for malignant neoplasm of breast: Secondary | ICD-10-CM | POA: Insufficient documentation

## 2012-08-15 DIAGNOSIS — Z139 Encounter for screening, unspecified: Secondary | ICD-10-CM

## 2012-08-28 ENCOUNTER — Ambulatory Visit (INDEPENDENT_AMBULATORY_CARE_PROVIDER_SITE_OTHER): Payer: Medicaid Other | Admitting: Family Medicine

## 2012-08-28 ENCOUNTER — Encounter: Payer: Self-pay | Admitting: Family Medicine

## 2012-08-28 VITALS — BP 132/70 | HR 86 | Temp 98.7°F | Resp 18 | Wt 161.0 lb

## 2012-08-28 DIAGNOSIS — L259 Unspecified contact dermatitis, unspecified cause: Secondary | ICD-10-CM

## 2012-08-28 DIAGNOSIS — R300 Dysuria: Secondary | ICD-10-CM

## 2012-08-28 DIAGNOSIS — J019 Acute sinusitis, unspecified: Secondary | ICD-10-CM

## 2012-08-28 DIAGNOSIS — R51 Headache: Secondary | ICD-10-CM

## 2012-08-28 DIAGNOSIS — L309 Dermatitis, unspecified: Secondary | ICD-10-CM

## 2012-08-28 LAB — URINALYSIS, ROUTINE W REFLEX MICROSCOPIC
Hgb urine dipstick: NEGATIVE
Ketones, ur: NEGATIVE mg/dL
Leukocytes, UA: NEGATIVE
Nitrite: NEGATIVE
Protein, ur: NEGATIVE mg/dL

## 2012-08-28 MED ORDER — ALBUTEROL SULFATE HFA 108 (90 BASE) MCG/ACT IN AERS: 2.0000 | INHALATION_SPRAY | Freq: Four times a day (QID) | RESPIRATORY_TRACT | Status: DC | PRN

## 2012-08-28 MED ORDER — TRIAMCINOLONE ACETONIDE 0.1 % EX CREA: TOPICAL_CREAM | Freq: Two times a day (BID) | CUTANEOUS | Status: DC

## 2012-08-28 MED ORDER — LEVOFLOXACIN 750 MG PO TABS: 750.0000 mg | ORAL_TABLET | Freq: Every day | ORAL | Status: DC

## 2012-08-28 NOTE — Progress Notes (Signed)
Subjective:    Patient ID: Melissa Garcia, female    DOB: 1961/01/31, 52 y.o.   MRN: [id number]  HPI Patient reports 2 days of frontal and maxillary sinus pressure and pain, rhinorrhea, sneezing, congestion, and cough. She also reports fever. She reports frontal headache which is a constant pressure .  She denies photophobia or phonophobia. She denies nausea or vomiting. She also reports 2 days of dysuria and urinary frequency.  She also reports low back pain. She also has an eczema-like rash on her upper back. The rash has been intermittent and comes and goes for the last in different areas for the last few years. She's previously had it controlled using triamcinolone cream.  She is requesting a refill on the triamcinolone cream.  Past Medical History  Diagnosis Date  . Coronary atherosclerosis of native coronary artery     DES LAD and PTCA diagonal 8/10, LVEF 30% 8/10  . Myocardial infarct     AMI 8/10  . Essential hypertension, benign   . Urinary incontinence   . Bipolar disorder   . COPD (chronic obstructive pulmonary disease)   . Eczema    Current Outpatient Prescriptions on File Prior to Visit  Medication Sig Dispense Refill  . aspirin EC 81 MG tablet Take 81 mg by mouth daily.        . cefdinir (OMNICEF) 300 MG capsule Take 1 capsule (300 mg total) by mouth 2 (two) times daily.  20 capsule  0  . Multiple Vitamins-Minerals (MULTIVITAMINS THER. W/MINERALS) TABS Take 1 tablet by mouth daily.        . nitroGLYCERIN (NITROSTAT) 0.4 MG SL tablet Place 1 tablet (0.4 mg total) under the tongue every 5 (five) minutes as needed for chest pain.  25 tablet  3  . nystatin (MYCOSTATIN) 100000 UNIT/ML suspension Take 5 mLs (500,000 Units total) by mouth 4 (four) times daily.  140 mL  0  . Omega-3 Fatty Acids (FISH OIL PO) Take 1 capsule by mouth 2 (two) times daily.       . pantoprazole (PROTONIX) 20 MG tablet Take 1 tablet (20 mg total) by mouth daily.  30 tablet  0  . ranitidine (ZANTAC) 75 MG  tablet Take 75 mg by mouth daily.      . sucralfate (CARAFATE) 1 GM/10ML suspension Take 10 mLs (1 g total) by mouth 4 (four) times daily.  420 mL  0  . varenicline (CHANTIX) 0.5 MG tablet Take 1 tablet (0.5 mg total) by mouth 2 (two) times daily.  60 tablet  0   No current facility-administered medications on file prior to visit.   Allergies  Allergen Reactions  . Codeine Nausea And Vomiting    Vicodin/Percocet Specified= excessive vomiting  . Sulfonamide Derivatives Hives   History   Social History  . Marital Status: Single    Spouse Name: N/A    Number of Children: N/A  . Years of Education: N/A   Occupational History  . Disabled    Social History Main Topics  . Smoking status: Former Smoker -- 1.50 packs/day    Types: Cigarettes  . Smokeless tobacco: Former Neurosurgeon    Quit date: 05/23/2012  . Alcohol Use: No  . Drug Use: No  . Sexually Active: Yes    Birth Control/ Protection: None   Other Topics Concern  . Not on file   Social History Narrative  . No narrative on file      Review of Systems  All other systems reviewed  and are negative.       Objective:   Physical Exam  Vitals reviewed. Constitutional: She is oriented to person, place, and time. She appears well-developed and well-nourished.  HENT:  Head: Normocephalic and atraumatic.  Right Ear: External ear normal.  Left Ear: External ear normal.  Nose: Nose normal.  Mouth/Throat: Oropharynx is clear and moist. No oropharyngeal exudate.  Neck: Neck supple. No JVD present. No thyromegaly present.  Cardiovascular: Normal rate, normal heart sounds and intact distal pulses.   No murmur heard. Pulmonary/Chest: Effort normal and breath sounds normal. No respiratory distress. She has no wheezes. She has no rales. She exhibits no tenderness.  Abdominal: Soft. Bowel sounds are normal. She exhibits no distension and no mass. There is no tenderness. There is no rebound and no guarding.  Musculoskeletal: Normal  range of motion. She exhibits no edema and no tenderness.  Lymphadenopathy:    She has no cervical adenopathy.  Neurological: She is alert and oriented to person, place, and time. She has normal reflexes. She displays normal reflexes. No cranial nerve deficit. She exhibits normal muscle tone. Coordination normal.  Skin: Rash (eczema on her upper posterior shoulders) noted.   no CVAT.        Assessment & Plan:  1. Dysuria Urinalysis is negative.  However the patient was only able to provide a trace amount of urine present sample. Therefore I have recommended she push fluids including water. I also pick an antibiotic to treat her sinus infection that would also cover her urinary tract infection in case the urinalysis is in correct. - Urinalysis, Routine w reflex microscopic  2. Headache(784.0) Exam today was normal. The patient's symptoms are most likely due to a sinus infection. - levofloxacin (LEVAQUIN) 750 MG tablet; Take 1 tablet (750 mg total) by mouth daily.  Dispense: 7 tablet; Refill: 0  3. Eczema - triamcinolone cream (KENALOG) 0.1 %; Apply topically 2 (two) times daily. For 10 days  Dispense: 30 g; Refill: 0  4. Acute rhinosinusitis Levaquin 750 mg by mouth daily for 7 days. This should also cover for a possible urinary tract infection which I think is the most likely explanation for the patient's dysuria even given a negative urinalysis.

## 2012-09-18 ENCOUNTER — Encounter: Payer: Self-pay | Admitting: Cardiology

## 2012-09-18 ENCOUNTER — Encounter: Payer: Medicaid Other | Admitting: Cardiology

## 2012-09-18 NOTE — Progress Notes (Signed)
Rescheduled This encounter was created in error - please disregard. 

## 2012-09-24 ENCOUNTER — Telehealth: Payer: Self-pay | Admitting: Physician Assistant

## 2012-09-24 NOTE — Telephone Encounter (Signed)
NTBS.  I have reviewed records.  None of our recent OVs here have had to do with this.  I see where she went to ER in 1/14 with groin pain and they did u/s tests.  O/w I do not have further info about this- NTBS

## 2012-10-01 NOTE — Telephone Encounter (Signed)
Able to reach patient and appt was made

## 2012-10-02 ENCOUNTER — Telehealth: Payer: Self-pay | Admitting: Family Medicine

## 2012-10-02 ENCOUNTER — Ambulatory Visit (INDEPENDENT_AMBULATORY_CARE_PROVIDER_SITE_OTHER): Payer: Medicaid Other | Admitting: Physician Assistant

## 2012-10-02 ENCOUNTER — Encounter: Payer: Self-pay | Admitting: Physician Assistant

## 2012-10-02 VITALS — BP 122/76 | HR 72 | Temp 98.2°F | Resp 18 | Wt 159.0 lb

## 2012-10-02 DIAGNOSIS — N393 Stress incontinence (female) (male): Secondary | ICD-10-CM | POA: Insufficient documentation

## 2012-10-02 MED ORDER — OXYBUTYNIN CHLORIDE 5 MG PO TABS: 5.0000 mg | ORAL_TABLET | Freq: Two times a day (BID) | ORAL | Status: DC

## 2012-10-02 MED ORDER — TOLTERODINE TARTRATE ER 2 MG PO CP24: 2.0000 mg | ORAL_CAPSULE | Freq: Every day | ORAL | Status: DC

## 2012-10-02 NOTE — Telephone Encounter (Signed)
Insurance does not cover Detrol LA (Tolterodine Tartrate ER)  Provider is changing to Oxybutynin 5 mg po BID.  Left mess for patient to call back to inform about change

## 2012-10-02 NOTE — Progress Notes (Signed)
Patient ID: GERYL DOHN MRN: [id number], DOB: [birth year], 52 y.o. Date of Encounter: 10/02/2012, 12:47 PM    Chief Complaint:  Chief Complaint  Patient presents with  . gyn prob wants referral    c/o lower pelvic pain  "feels like things are falling out"     HPI: 52 y.o. year old white female reports that for many years she has had problems with urine leaking. She says she has to wear a pad. She says anytime she coughs bends over or squat a large amount of urine comes out.  If she is just at rest with none of the above types of maneuvers then she does not have any incontinence. She has never been evaluated about this and has never been on medication for this. She sometimes feels some discomfort in her lower pelvic area and wasn't sure if this was related. Reports that her last pelvic exam was done here.  Home Meds: See attached medication section for any medications that were entered at today's visit. The computer does not put those onto this list.The following list is a list of meds entered prior to today's visit.   Current Outpatient Prescriptions on File Prior to Visit  Medication Sig Dispense Refill  . albuterol (PROAIR HFA) 108 (90 BASE) MCG/ACT inhaler Inhale 2 puffs into the lungs every 6 (six) hours as needed for wheezing.  1 Inhaler  prn  . aspirin EC 81 MG tablet Take 81 mg by mouth daily.        . Multiple Vitamins-Minerals (MULTIVITAMINS THER. W/MINERALS) TABS Take 1 tablet by mouth daily.        . Omega-3 Fatty Acids (FISH OIL PO) Take 1 capsule by mouth 2 (two) times daily.       . pantoprazole (PROTONIX) 20 MG tablet Take 1 tablet (20 mg total) by mouth daily.  30 tablet  0  . ranitidine (ZANTAC) 75 MG tablet Take 75 mg by mouth daily.      Marland Kitchen triamcinolone cream (KENALOG) 0.1 % Apply topically 2 (two) times daily. For 10 days  30 g  0  . varenicline (CHANTIX) 0.5 MG tablet Take 1 tablet (0.5 mg total) by mouth 2 (two) times daily.  60 tablet  0  . nitroGLYCERIN  (NITROSTAT) 0.4 MG SL tablet Place 1 tablet (0.4 mg total) under the tongue every 5 (five) minutes as needed for chest pain.  25 tablet  3  . sucralfate (CARAFATE) 1 GM/10ML suspension Take 10 mLs (1 g total) by mouth 4 (four) times daily.  420 mL  0   No current facility-administered medications on file prior to visit.    Allergies:  Allergies  Allergen Reactions  . Codeine Nausea And Vomiting    Vicodin/Percocet Specified= excessive vomiting  . Sulfonamide Derivatives Hives      Review of Systems: See HPI for pertinent ROS. All other ROS negative.    Physical Exam: Blood pressure 122/76, pulse 72, temperature 98.2 F (36.8 C), temperature source Oral, resp. rate 18, weight 159 lb (72.122 kg)., Body mass index is 28.17 kg/(m^2). General: well-nourished well-developed white female. Appears in no acute distress. Lungs: Clear bilaterally to auscultation without wheezes, rales, or rhonchi. Breathing is unlabored. Heart: Regular rhythm. No murmurs, rubs, or gallops. Abdomen: Soft, non-tender, non-distended with normoactive bowel sounds. No hepatomegaly. No rebound/guarding. No obvious abdominal masses. Msk:  Strength and tone normal for age. Pelvic exam: External genitalia normal. Vaginal mucosa normal. There is fairly minimal prolapse of her bladder. Neuro:  Alert and oriented X 3. Moves all extremities spontaneously. Gait is normal. CNII-XII grossly in tact. Psych:  Responds to questions appropriately with a normal affect.     ASSESSMENT AND PLAN:  52 y.o. year old female with  1. Stress incontinence Discussed findings a pelvic exam and the fact that she has minimal prolapse. I do not think she needs any surgical intervention. Also discussed medication options. Discussed that we will start with a generic and start with a low dose medication. Discussed that as she has adverse effects to call us not simply stop the medication. Discussed there would be other options if needed. As well  discussed that we are starting with low dose. If this is not effective enough we can increase the dose. - tolterodine (DETROL LA) 2 MG 24 hr capsule; Take 1 capsule (2 mg total) by mouth daily.  Dispense: 30 capsule; Refill: 3   Signed, [address] [place], [place], [facility] 10/02/2012 12:47 PM

## 2012-10-10 IMAGING — CR DG CHEST 2V
2 series · 2 of 2 positions shown · non-contrast
Comparison: Chest radiograph performed 03/24/2010

CLINICAL DATA: Cough, shortness of breath and chest soreness.
History of smoking.

CHEST - 2 VIEW

[view not recorded (1 of 2)]
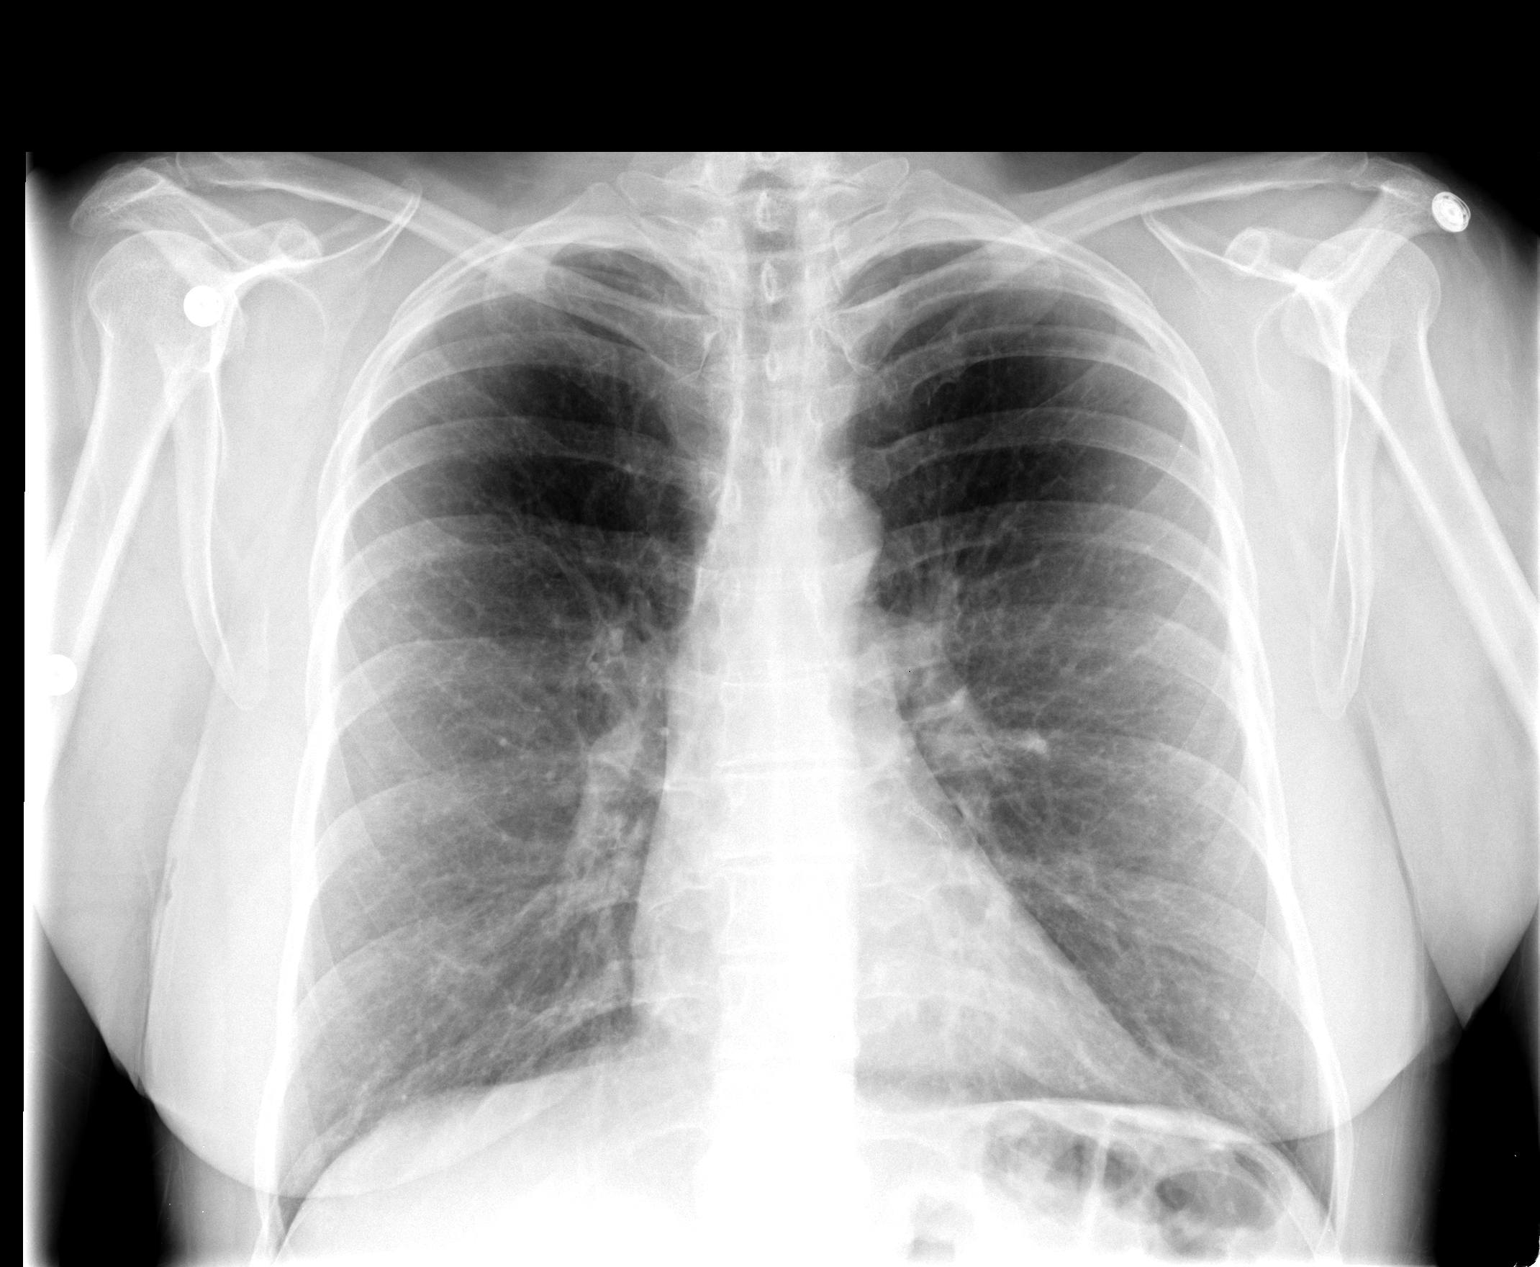

[view not recorded (2 of 2)]
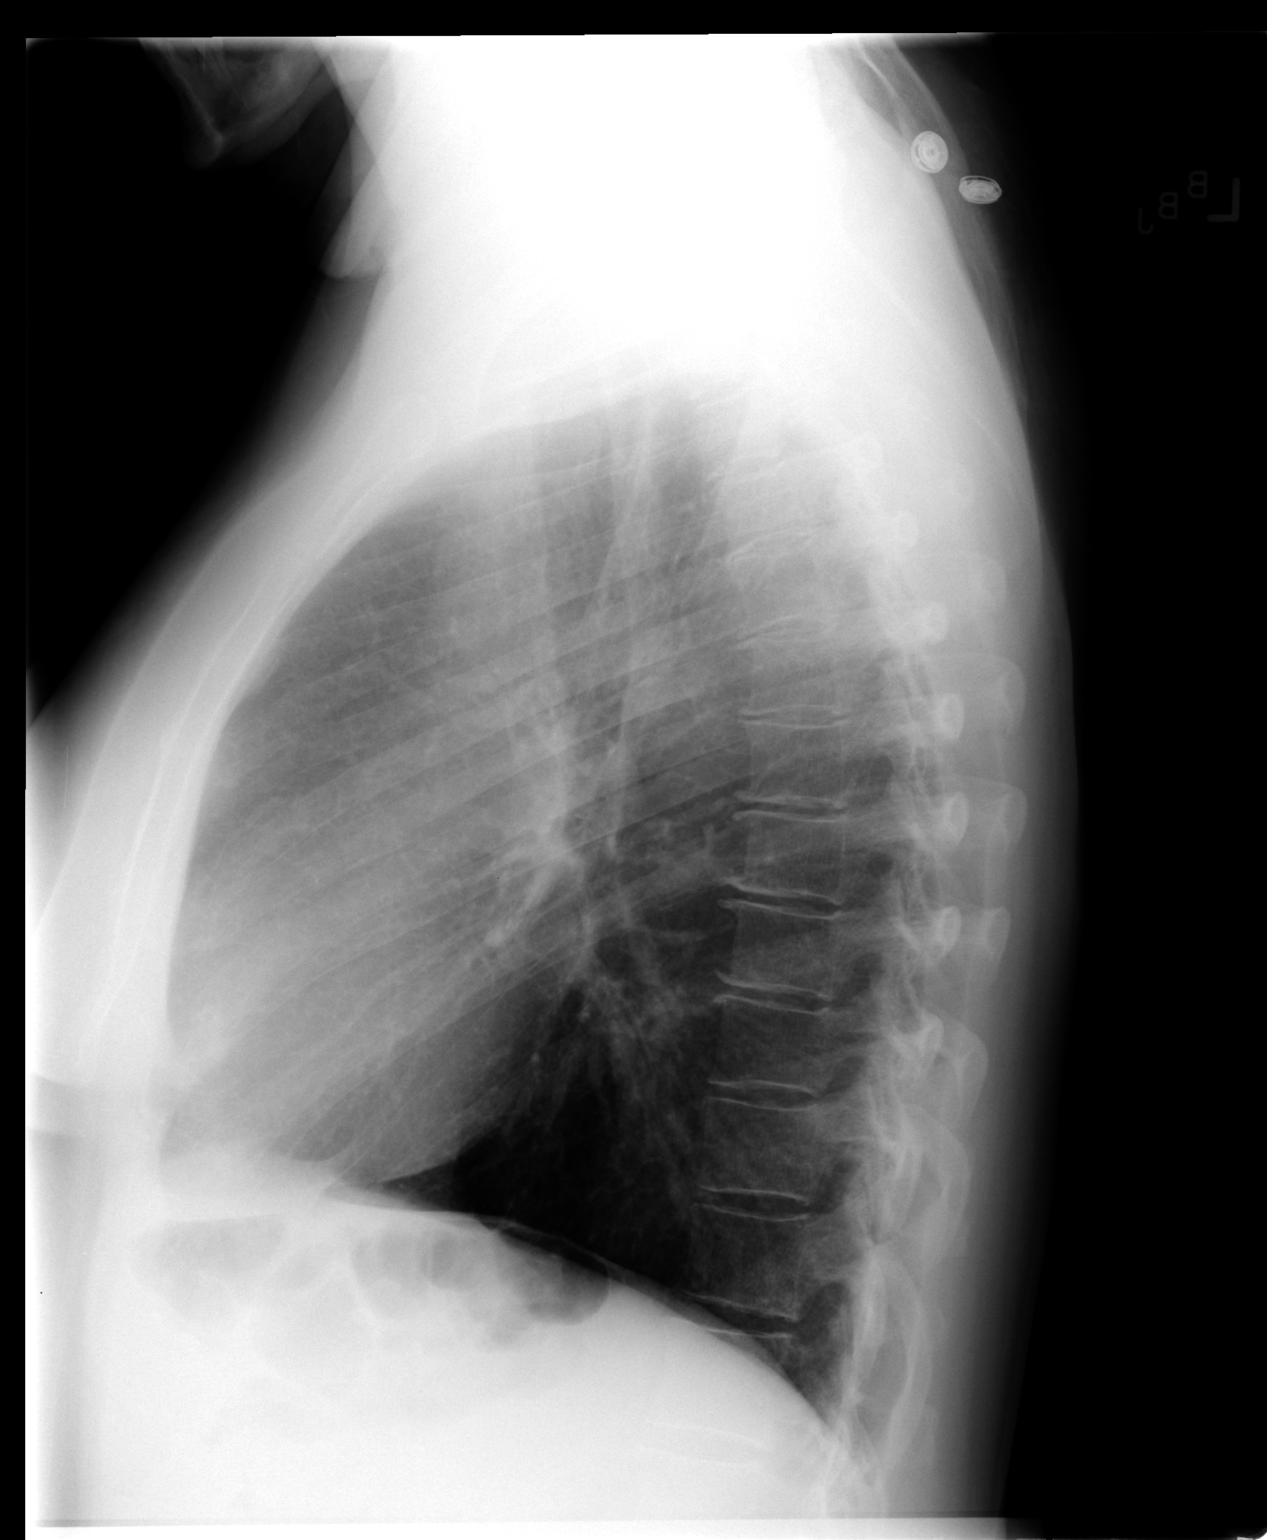

[2 of 2 positions shown; findings below may reference images not displayed]

FINDINGS: The lungs are hyperexpanded, with flattening of the
hemidiaphragms, compatible with COPD.  Mild chronic peribronchial
thickening is noted.  Minimal bibasilar atelectasis is seen.  There
is no evidence of pleural effusion or pneumothorax.

The heart is normal in size; the mediastinal contour is within
normal limits.  No acute osseous abnormalities are seen.
IMPRESSION: Findings of COPD; minimal bibasilar atelectasis noted.  Lungs
otherwise clear.

## 2012-10-20 ENCOUNTER — Encounter: Payer: Self-pay | Admitting: Physician Assistant

## 2012-10-20 ENCOUNTER — Ambulatory Visit (INDEPENDENT_AMBULATORY_CARE_PROVIDER_SITE_OTHER): Payer: Medicaid Other | Admitting: Physician Assistant

## 2012-10-20 VITALS — BP 132/84 | HR 72 | Temp 98.1°F | Resp 18 | Wt 156.0 lb

## 2012-10-20 DIAGNOSIS — M2550 Pain in unspecified joint: Secondary | ICD-10-CM

## 2012-10-20 DIAGNOSIS — N393 Stress incontinence (female) (male): Secondary | ICD-10-CM

## 2012-10-20 DIAGNOSIS — F17200 Nicotine dependence, unspecified, uncomplicated: Secondary | ICD-10-CM

## 2012-10-20 MED ORDER — VARENICLINE TARTRATE 0.5 MG PO TABS: 0.5000 mg | ORAL_TABLET | Freq: Two times a day (BID) | ORAL | Status: DC

## 2012-10-20 MED ORDER — OXYBUTYNIN CHLORIDE ER 10 MG PO TB24: 10.0000 mg | ORAL_TABLET | Freq: Every day | ORAL | Status: DC

## 2012-10-20 NOTE — Progress Notes (Signed)
Patient ID: Melissa Garcia MRN: [id number], DOB: [birth year], 52 y.o. Date of Encounter: @DATE @  Chief Complaint:  Chief Complaint  Patient presents with  . Joint Pain    HPI: 52 y.o. year old white female  presents with complaints of pain in multiple joints over the last 2-3 weeks. She says that she has a history of tendinitis in the right shoulder. She isn't sure of her neck pain is related to that or not. However she recently had significant pain in the back of her neck and a drawing sensation there. As well she has had pain in the joints of her wrists bilaterally. Positive pain in the joints of her knees and ankles bilaterally. She says this has been new all of a sudden in the past 2-3 weeks. Prior to that she was not having any significant achiness in any of these areas. She says the pain is in the joints not in the muscle areas. She does not work at a job. 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 However she says that this has all been the same. She has done no new or increased amount of physical activity in the past month compared to usual. She's noticed no swelling or erythema in any of the joints.  She does note that she went from the lower dose to the higher dosage of Chantix about the same time that the symptoms started. She doesn't know this is a coincidence or whether this has anything to do with the symptoms. She says that the Chantix it has helped a lot and she really has not wanted to smoke even while taking the low dose.  As well I reviewed that her last office visit with me was on 10/02/12. At that time she has complaints of stress incontinence. Did a pelvic exam that was essentially normal with no significant prolapse. She started on Detrol LA 2 mg. She reports that she's been taking the medicine daily. However she has seen no improvement in her symptoms. She is having no adverse effects to the medicine.   Past Medical History  Diagnosis Date  . Coronary atherosclerosis  of native coronary artery     DES LAD and PTCA diagonal 8/10, LVEF 30% 8/10  . Myocardial infarct     AMI 8/10  . Essential hypertension, benign   . Urinary incontinence   . Bipolar disorder   . COPD (chronic obstructive pulmonary disease)   . Eczema   . Stress incontinence      Home Meds: See attached medication section for current medication list. Any medications entered into computer today will not appear on this note's list. The medications listed below were entered prior to today. Current Outpatient Prescriptions on File Prior to Visit  Medication Sig Dispense Refill  . albuterol (PROAIR HFA) 108 (90 BASE) MCG/ACT inhaler Inhale 2 puffs into the lungs every 6 (six) hours as needed for wheezing.  1 Inhaler  prn  . aspirin EC 81 MG tablet Take 81 mg by mouth daily.        . Multiple Vitamins-Minerals (MULTIVITAMINS THER. W/MINERALS) TABS Take 1 tablet by mouth daily.        . nitroGLYCERIN (NITROSTAT) 0.4 MG SL tablet Place 1 tablet (0.4 mg total) under the tongue every 5 (five) minutes as needed for chest pain.  25 tablet  3  . Omega-3 Fatty Acids (FISH OIL PO) Take 1 capsule by mouth 2 (two) times daily.       . pantoprazole (PROTONIX)  20 MG tablet Take 1 tablet (20 mg total) by mouth daily.  30 tablet  0  . ranitidine (ZANTAC) 75 MG tablet Take 75 mg by mouth daily.      . sucralfate (CARAFATE) 1 GM/10ML suspension Take 10 mLs (1 g total) by mouth 4 (four) times daily.  420 mL  0  . triamcinolone cream (KENALOG) 0.1 % Apply topically 2 (two) times daily. For 10 days  30 g  0   No current facility-administered medications on file prior to visit.    Allergies:  Allergies  Allergen Reactions  . Codeine Nausea And Vomiting    Vicodin/Percocet Specified= excessive vomiting  . Sulfonamide Derivatives Hives    History   Social History  . Marital Status: Single    Spouse Name: N/A    Number of Children: N/A  . Years of Education: N/A   Occupational History  . Disabled     Social History Main Topics  . Smoking status: Former Smoker -- 1.50 packs/day    Types: Cigarettes  . Smokeless tobacco: Former Neurosurgeon    Quit date: 05/23/2012  . Alcohol Use: No  . Drug Use: No  . Sexual Activity: Yes    Birth Control/ Protection: None   Other Topics Concern  . Not on file   Social History Narrative  . No narrative on file    Family History  Problem Relation Age of Onset  . Coronary artery disease    . Hypertension    . Cancer Father     lung     Review of Systems:  See HPI for pertinent ROS. All other ROS negative.    Physical Exam: Blood pressure 132/84, pulse 72, temperature 98.1 F (36.7 C), temperature source Oral, resp. rate 18, weight 156 lb (70.761 kg)., Body mass index is 27.64 kg/(m^2). General: Well-nourished well-developed white female .Appears in no acute distress. Neck: Supple. No thyromegaly. No lymphadenopathy. Lungs: Clear bilaterally to auscultation without wheezes, rales, or rhonchi. Breathing is unlabored. Heart: RRR with S1 S2. No murmurs, rubs, or gallops. Musculoskeletal:  Strength and tone normal for age. There is no erythema or swelling of the neck or either shoulders or wrists bilaterally. As well no erythema or swelling of the knees or ankles bilaterally. Extremities/Skin: Warm and dry. No clubbing or cyanosis. No edema. No rashes or suspicious lesions. Neuro: Alert and oriented X 3. Moves all extremities spontaneously. Gait is normal. CNII-XII grossly in tact. Psych:  Responds to questions appropriately with a normal affect.     ASSESSMENT AND PLAN:  52 y.o. year old female with  1. Joint pain - CBC with Differential - COMPLETE METABOLIC PANEL WITH GFR - Rheumatoid factor - ANA - Sedimentation rate - TSH - Uric acid  2. TOBACCO ABUSE We'll decrease the Chantix back to the low dose to monitor in case this was contributing to her symptoms. She is to continue to monitor. If symptoms continue followup. - varenicline  (CHANTIX) 0.5 MG tablet; Take 1 tablet (0.5 mg total) by mouth 2 (two) times daily.  Dispense: 60 tablet; Refill: 2  3. Stress incontinence We'll increase the dose of medication. See history of present illness. - oxybutynin (DITROPAN-XL) 10 MG 24 hr tablet; Take 1 tablet (10 mg total) by mouth daily.  Dispense: 30 tablet; Refill: 3   Signed, [address] [place], [place], [facility] 10/20/2012 11:08 AM

## 2012-10-20 NOTE — Progress Notes (Signed)
Pt was having blood drawn by lab.  C/O severe pain in left antecubital area with venipuncture.  Lab removed needle immediately.  Area is not red or swollen.  Patient able to move arm and hand grip is strong.  Patient was reassured.  Arm pain will resolve, if not let us know.

## 2012-10-21 ENCOUNTER — Other Ambulatory Visit: Payer: Self-pay | Admitting: Physician Assistant

## 2012-10-21 ENCOUNTER — Telehealth: Payer: Self-pay | Admitting: Family Medicine

## 2012-10-21 ENCOUNTER — Other Ambulatory Visit: Payer: Medicaid Other

## 2012-10-21 LAB — COMPLETE METABOLIC PANEL WITH GFR
ALT: 10 U/L (ref 0–35)
Albumin: 4.3 g/dL (ref 3.5–5.2)
CO2: 30 mEq/L (ref 19–32)
Calcium: 10.2 mg/dL (ref 8.4–10.5)
Chloride: 106 mEq/L (ref 96–112)
Creat: 0.84 mg/dL (ref 0.50–1.10)
GFR, Est African American: >89 mL/min
Sodium: 143 mEq/L (ref 135–145)
Total Protein: 6.8 g/dL (ref 6.0–8.3)

## 2012-10-21 LAB — CBC WITH DIFFERENTIAL/PLATELET
Eosinophils Relative: 3 % (ref 0–5)
HCT: 41.9 % (ref 36.0–46.0)
Hemoglobin: 14.9 g/dL (ref 12.0–15.0)
MCH: 33.2 pg (ref 26.0–34.0)
MCHC: 35.6 g/dL (ref 30.0–36.0)
Neutro Abs: 2.5 10*3/uL (ref 1.7–7.7)
Neutrophils Relative %: 57 % (ref 43–77)
RDW: 14.1 % (ref 11.5–15.5)

## 2012-10-21 LAB — URIC ACID: Uric Acid, Serum: 4.9 mg/dL (ref 2.4–7.0)

## 2012-10-21 MED ORDER — FESOTERODINE FUMARATE ER 8 MG PO TB24: 8.0000 mg | ORAL_TABLET | Freq: Every day | ORAL | Status: DC

## 2012-10-21 NOTE — Telephone Encounter (Signed)
Pharmacy sent PA for Oxybutynin ER.  Not covered by patient's insurance.  Per verbal order, from [facility] PAC, change to preferred medication Toviaz 8 mg one PO QD  #30 +3 RF.   Medication list updated and new RX to pharmacy.

## 2012-10-24 LAB — ROCKY MTN SPOTTED FVR ABS PNL(IGG+IGM)
RMSF IgG: 0.07 IV
RMSF IgM: 0.11 IV

## 2012-10-28 ENCOUNTER — Telehealth: Payer: Self-pay | Admitting: *Deleted

## 2012-10-28 NOTE — Telephone Encounter (Signed)
.  left message to have patient return my call with a family member

## 2012-10-28 NOTE — Telephone Encounter (Signed)
Please advise 

## 2012-10-28 NOTE — Telephone Encounter (Signed)
She has no cardiac specific indication for antibiotics prior to dental cleaning.

## 2012-10-28 NOTE — Telephone Encounter (Signed)
Pt states that she was told she needed to be on antibiotics before have her teeth cleaned. She is scheduled for a deep cleaning tomorrow and needs some antibiotics called in.

## 2012-10-29 NOTE — Telephone Encounter (Signed)
.  left message to have patient return my call with family member

## 2012-10-30 ENCOUNTER — Ambulatory Visit (INDEPENDENT_AMBULATORY_CARE_PROVIDER_SITE_OTHER): Payer: Medicaid Other | Admitting: Physician Assistant

## 2012-10-30 ENCOUNTER — Encounter: Payer: Self-pay | Admitting: Physician Assistant

## 2012-10-30 VITALS — BP 126/86 | HR 80 | Temp 98.0°F | Resp 18 | Wt 153.0 lb

## 2012-10-30 DIAGNOSIS — A499 Bacterial infection, unspecified: Secondary | ICD-10-CM

## 2012-10-30 DIAGNOSIS — J988 Other specified respiratory disorders: Secondary | ICD-10-CM

## 2012-10-30 DIAGNOSIS — J438 Other emphysema: Secondary | ICD-10-CM

## 2012-10-30 DIAGNOSIS — J449 Chronic obstructive pulmonary disease, unspecified: Secondary | ICD-10-CM

## 2012-10-30 DIAGNOSIS — B9689 Other specified bacterial agents as the cause of diseases classified elsewhere: Secondary | ICD-10-CM

## 2012-10-30 DIAGNOSIS — F17200 Nicotine dependence, unspecified, uncomplicated: Secondary | ICD-10-CM

## 2012-10-30 DIAGNOSIS — N393 Stress incontinence (female) (male): Secondary | ICD-10-CM

## 2012-10-30 MED ORDER — AZITHROMYCIN 250 MG PO TABS: ORAL_TABLET | ORAL | Status: DC

## 2012-10-30 MED ORDER — MIRABEGRON ER 25 MG PO TB24: 25.0000 mg | ORAL_TABLET | Freq: Every day | ORAL | Status: DC

## 2012-10-30 MED ORDER — PREDNISONE 20 MG PO TABS: ORAL_TABLET | ORAL | Status: DC

## 2012-10-30 NOTE — Progress Notes (Signed)
Patient ID: EMILYANNE MCGOUGH MRN: [id number], DOB: 1960-05-27, 52 y.o. Date of Encounter: @DATE @  Chief Complaint:  Chief Complaint  Patient presents with  . c/o swollen glands, cold    cough, congestion  . reaction to Gala Murdoch    HPI: 52 y.o. year old white female  presents with :  Has been sick for about 4 days. This started with nasal congestion. The lymph nodes in her left neck have been tender and sore. Then she started developing a lot of cough. She now has episodes of "coughing spells quite where she just keeps coughing repeatedly. Last night had to use her inhaler because was wheezing significantly. He is getting out thick dark mucus from her nose as well as thick dark phlegm when she coughs. No significant fever and no chills. Minimal sore throat and no ear ache. Prior to getting sick with these symptoms was down to 1 or 2 cigarettes per day. Has not smoked this week since being sick.  As well she has recently been seen by me regarding stress incontinence. Says that she was having symptoms of feeling hot as if having significant hot flashes. Is well felt almost dehydrated because her mouth felt so dry and she felt extremely thirsty. She stopped heavy asked and all of the symptoms resolved.     Past Medical History  Diagnosis Date  . Coronary atherosclerosis of native coronary artery     DES LAD and PTCA diagonal 8/10, LVEF 30% 8/10  . Myocardial infarct     AMI 8/10  . Essential hypertension, benign   . Urinary incontinence   . Bipolar disorder   . COPD (chronic obstructive pulmonary disease)   . Eczema   . Stress incontinence      Home Meds: See attached medication section for current medication list. Any medications entered into computer today will not appear on this note's list. The medications listed below were entered prior to today. Current Outpatient Prescriptions on File Prior to Visit  Medication Sig Dispense Refill  . albuterol (PROAIR HFA) 108 (90 BASE)  MCG/ACT inhaler Inhale 2 puffs into the lungs every 6 (six) hours as needed for wheezing.  1 Inhaler  prn  . aspirin EC 81 MG tablet Take 81 mg by mouth daily.        . Multiple Vitamins-Minerals (MULTIVITAMINS THER. W/MINERALS) TABS Take 1 tablet by mouth daily.        . Omega-3 Fatty Acids (FISH OIL PO) Take 1 capsule by mouth 2 (two) times daily.       . pantoprazole (PROTONIX) 20 MG tablet Take 1 tablet (20 mg total) by mouth daily.  30 tablet  0  . ranitidine (ZANTAC) 75 MG tablet Take 75 mg by mouth daily.      . sucralfate (CARAFATE) 1 GM/10ML suspension Take 10 mLs (1 g total) by mouth 4 (four) times daily.  420 mL  0  . triamcinolone cream (KENALOG) 0.1 % Apply topically 2 (two) times daily. For 10 days  30 g  0  . varenicline (CHANTIX) 0.5 MG tablet Take 1 tablet (0.5 mg total) by mouth 2 (two) times daily.  60 tablet  2  . nitroGLYCERIN (NITROSTAT) 0.4 MG SL tablet Place 1 tablet (0.4 mg total) under the tongue every 5 (five) minutes as needed for chest pain.  25 tablet  3   No current facility-administered medications on file prior to visit.    Allergies:  Allergies  Allergen Reactions  . Codeine Nausea  And Vomiting    Vicodin/Percocet Specified= excessive vomiting  . Sulfonamide Derivatives Hives  . Toviaz [Fesoterodine Fumarate Er]     Extremely dry mouth, felt hot like hotflashes. Felt dehydrated.    History   Social History  . Marital Status: Single    Spouse Name: N/A    Number of Children: N/A  . Years of Education: N/A   Occupational History  . Disabled    Social History Main Topics  . Smoking status: Former Smoker -- 1.50 packs/day    Types: Cigarettes  . Smokeless tobacco: Former Neurosurgeon    Quit date: 05/23/2012  . Alcohol Use: No  . Drug Use: No  . Sexual Activity: Yes    Birth Control/ Protection: None   Other Topics Concern  . Not on file   Social History Narrative  . No narrative on file    Family History  Problem Relation Age of Onset  .  Coronary artery disease    . Hypertension    . Cancer Father     lung     Review of Systems:  See HPI for pertinent ROS. All other ROS negative.    Physical Exam: Blood pressure 126/86, pulse 80, temperature 98 F (36.7 C), temperature source Oral, resp. rate 18, weight 153 lb (69.4 kg)., Body mass index is 27.11 kg/(m^2). General: Well-nourished well-developed white female . Appears in no acute distress. Head: Normocephalic, atraumatic, eyes without discharge, sclera non-icteric, nares are without discharge. Bilateral auditory canals clear, TM's are without perforation, pearly grey and translucent with reflective cone of light bilaterally. Oral cavity moist, posterior pharynx without exudate, erythema, peritonsillar abscess.  Neck: Supple. No thyromegaly. No lymphadenopathy. Lungs: Breathing is unlabored. No use of accessory muscles. However she does have some mild wheezes at bilateral bases. Upper areas of the lungs are clear bilaterally. Good air movement. Heart: RRR with S1 S2. No murmurs, rubs, or gallops. Musculoskeletal:  Strength and tone normal for age. Extremities/Skin: Warm and dry. No clubbing or cyanosis. No edema. Neuro: Alert and oriented X 3. Moves all extremities spontaneously. Gait is normal. CNII-XII grossly in tact. Psych:  Responds to questions appropriately with a normal affect.     ASSESSMENT AND PLAN:  52 y.o. year old female with  1. Bacterial respiratory infection - azithromycin (ZITHROMAX) 250 MG tablet; Day 1: Take 2 daily.  Days 2-5: Take 1 daily.  Dispense: 6 tablet; Refill: 0 - predniSONE (DELTASONE) 20 MG tablet; Take 3 daily for 2 days, then 2 daily for 2 days, then 1 daily for 2 days.  Dispense: 12 tablet; Refill: 0 Use albuterol inhaler routinely every 4-6 hours for the next several days then can decrease to when necessary. Followup if symptoms worsen or do not resolve with completion of medications.  2. TOBACCO ABUSE Continue with cessation. Prior  to this sickness was down to 1-2 cigarettes per day and has not smoked any this week.  3. EMPHYSEMA  4. COPD (chronic obstructive pulmonary disease)   5. Stress incontinence Intolerant to Toviaz. We'll treat with Myrbetriq - mirabegron ER (MYRBETRIQ) 25 MG TB24 tablet; Take 1 tablet (25 mg total) by mouth daily.  Dispense: 30 tablet; Refill: 2 Followup if any adverse effects with this. Otherwise will have routine followup in 2-3 months   Signed, [address] [place], [place], [facility] 10/30/2012 11:44 AM

## 2012-11-03 ENCOUNTER — Encounter: Payer: Self-pay | Admitting: *Deleted

## 2012-11-03 NOTE — Telephone Encounter (Signed)
Several attempts have been made to contact pt with no resolve, sent letter to pt address noted in chart with results/instructions/prescriptions. Advised pt to call office if any questions or concerns per letter.  

## 2012-11-16 ENCOUNTER — Emergency Department (HOSPITAL_COMMUNITY): Payer: Medicaid Other

## 2012-11-16 ENCOUNTER — Emergency Department (HOSPITAL_COMMUNITY)
Admission: EM | Admit: 2012-11-16 | Discharge: 2012-11-16 | Disposition: A | Discharge status: Home / Self Care | Payer: Medicaid Other | Attending: Emergency Medicine | Admitting: Emergency Medicine

## 2012-11-16 ENCOUNTER — Encounter (HOSPITAL_COMMUNITY): Payer: Self-pay | Admitting: Emergency Medicine

## 2012-11-16 DIAGNOSIS — R1110 Vomiting, unspecified: Secondary | ICD-10-CM | POA: Insufficient documentation

## 2012-11-16 DIAGNOSIS — R109 Unspecified abdominal pain: Secondary | ICD-10-CM | POA: Insufficient documentation

## 2012-11-16 DIAGNOSIS — Z872 Personal history of diseases of the skin and subcutaneous tissue: Secondary | ICD-10-CM | POA: Insufficient documentation

## 2012-11-16 DIAGNOSIS — IMO0001 Reserved for inherently not codable concepts without codable children: Secondary | ICD-10-CM | POA: Insufficient documentation

## 2012-11-16 DIAGNOSIS — I2510 Atherosclerotic heart disease of native coronary artery without angina pectoris: Secondary | ICD-10-CM | POA: Insufficient documentation

## 2012-11-16 DIAGNOSIS — M549 Dorsalgia, unspecified: Secondary | ICD-10-CM | POA: Insufficient documentation

## 2012-11-16 DIAGNOSIS — Z8742 Personal history of other diseases of the female genital tract: Secondary | ICD-10-CM | POA: Insufficient documentation

## 2012-11-16 DIAGNOSIS — Z7982 Long term (current) use of aspirin: Secondary | ICD-10-CM | POA: Insufficient documentation

## 2012-11-16 DIAGNOSIS — Z79899 Other long term (current) drug therapy: Secondary | ICD-10-CM | POA: Insufficient documentation

## 2012-11-16 DIAGNOSIS — Z87891 Personal history of nicotine dependence: Secondary | ICD-10-CM | POA: Insufficient documentation

## 2012-11-16 DIAGNOSIS — Z9851 Tubal ligation status: Secondary | ICD-10-CM | POA: Insufficient documentation

## 2012-11-16 DIAGNOSIS — M79609 Pain in unspecified limb: Secondary | ICD-10-CM | POA: Insufficient documentation

## 2012-11-16 DIAGNOSIS — J441 Chronic obstructive pulmonary disease with (acute) exacerbation: Secondary | ICD-10-CM

## 2012-11-16 DIAGNOSIS — Z8659 Personal history of other mental and behavioral disorders: Secondary | ICD-10-CM | POA: Insufficient documentation

## 2012-11-16 DIAGNOSIS — Z9089 Acquired absence of other organs: Secondary | ICD-10-CM | POA: Insufficient documentation

## 2012-11-16 DIAGNOSIS — I10 Essential (primary) hypertension: Secondary | ICD-10-CM | POA: Insufficient documentation

## 2012-11-16 DIAGNOSIS — J40 Bronchitis, not specified as acute or chronic: Secondary | ICD-10-CM

## 2012-11-16 DIAGNOSIS — I252 Old myocardial infarction: Secondary | ICD-10-CM | POA: Insufficient documentation

## 2012-11-16 MED ORDER — PREDNISONE 20 MG PO TABS: 60.0000 mg | ORAL_TABLET | Freq: Every day | ORAL | Status: DC

## 2012-11-16 MED ORDER — AZITHROMYCIN 250 MG PO TABS: 250.0000 mg | ORAL_TABLET | Freq: Every day | ORAL | Status: DC

## 2012-11-16 MED ORDER — PREDNISONE 50 MG PO TABS
60.0000 mg | ORAL_TABLET | Freq: Once | ORAL | Status: AC
  Administered 2012-11-16: 60 mg via ORAL
  Filled 2012-11-16 (×2): qty 1

## 2012-11-16 MED ORDER — ALBUTEROL SULFATE (5 MG/ML) 0.5% IN NEBU
2.5000 mg | INHALATION_SOLUTION | Freq: Once | RESPIRATORY_TRACT | Status: AC
  Administered 2012-11-16: 2.5 mg via RESPIRATORY_TRACT
  Filled 2012-11-16: qty 0.5

## 2012-11-16 NOTE — ED Notes (Signed)
Pt c/o "cold" x 3 weeks. Prod cough with green and white sputum. Sharp pains across right shoulder to mid back area. Right arm aching-states same pain as when her tendonitis hurts x 3 weeks. Has seen pcp was put on steroids and antibiotics with no help. Pt c/o "acid reflux" also. Nad. Wet cough noted in triage.

## 2012-11-16 NOTE — ED Provider Notes (Signed)
CSN: [id number]     Arrival date & time 11/16/12  1045 History   This chart was scribed for No att. providers found by Caryn Bee, ED Scribe. This patient was seen in room APOTF/OTF and the patient's care was started 6:58 PM.    Chief Complaint  Patient presents with  . Cough  . Arm Pain   The history is provided by the patient. No language interpreter was used.   HPI Comments: Melissa Garcia is a 52 y.o. Female with h/o COPD who presents to the Emergency Department complaining of constant mild SOB for the past few days. Pt also complains of sore throat and has episodes post tussive emesis. She reports a productive cough.  She also states that she has sharp abdominal pain and also sharp pain that radiates through her right shoulder and back and down right arm.  Similar pain in the past and associated with tendonitis. Pt has had associated decrease in appetite. Pt denies sore throat, fever or chills.  Patient had MI in 2010.    Past Medical History  Diagnosis Date  . Coronary atherosclerosis of native coronary artery     DES LAD and PTCA diagonal 8/10, LVEF 30% 8/10  . Myocardial infarct     AMI 8/10  . Essential hypertension, benign   . Urinary incontinence   . Bipolar disorder   . COPD (chronic obstructive pulmonary disease)   . Eczema   . Stress incontinence    Past Surgical History  Procedure Laterality Date  . Appendectomy    . Tubal ligation     Family History  Problem Relation Age of Onset  . Coronary artery disease    . Hypertension    . Cancer Father     lung   History  Substance Use Topics  . Smoking status: Former Smoker -- 1.50 packs/day    Types: Cigarettes  . Smokeless tobacco: Former Neurosurgeon    Quit date: 05/23/2012  . Alcohol Use: No   OB History   Grav Para Term Preterm Abortions TAB SAB Ect Mult Living                 Review of Systems  Constitutional: Negative for fever.  Respiratory: Positive for cough and shortness of breath. Negative for  chest tightness.   Cardiovascular: Negative for chest pain.  Gastrointestinal: Positive for vomiting. Negative for nausea and abdominal pain.  Genitourinary: Negative for dysuria.  Musculoskeletal: Positive for back pain and myalgias (Radiating from right shoulder through her back).  Skin: Negative for rash.  Neurological: Negative for headaches.  Psychiatric/Behavioral: Negative for confusion.  All other systems reviewed and are negative.    Allergies  Codeine; Sulfonamide derivatives; and Toviaz  Home Medications   Current Outpatient Rx  Name  Route  Sig  Dispense  Refill  . albuterol (PROVENTIL HFA;VENTOLIN HFA) 108 (90 BASE) MCG/ACT inhaler   Inhalation   Inhale 2 puffs into the lungs every 4 (four) hours as needed for wheezing.         Marland Kitchen aspirin EC 81 MG tablet   Oral   Take 81 mg by mouth daily.           . mirabegron ER (MYRBETRIQ) 25 MG TB24 tablet   Oral   Take 1 tablet (25 mg total) by mouth daily.   30 tablet   2   . Multiple Vitamins-Minerals (MULTIVITAMINS THER. W/MINERALS) TABS   Oral   Take 1 tablet by mouth daily.           Marland Kitchen  Omega-3 Fatty Acids (FISH OIL PO)   Oral   Take 1 capsule by mouth 2 (two) times daily.          . ranitidine (ZANTAC) 75 MG tablet   Oral   Take 75 mg by mouth daily.         . varenicline (CHANTIX) 0.5 MG tablet   Oral   Take 1 tablet (0.5 mg total) by mouth 2 (two) times daily.   60 tablet   2   . azithromycin (ZITHROMAX) 250 MG tablet   Oral   Take 1 tablet (250 mg total) by mouth daily. Take first 2 tablets together, then 1 every day until finished.   6 tablet   0   . EXPIRED: nitroGLYCERIN (NITROSTAT) 0.4 MG SL tablet   Sublingual   Place 1 tablet (0.4 mg total) under the tongue every 5 (five) minutes as needed for chest pain.   25 tablet   3   . pantoprazole (PROTONIX) 20 MG tablet   Oral   Take 1 tablet (20 mg total) by mouth daily.   30 tablet   0   . predniSONE (DELTASONE) 20 MG tablet    Oral   Take 3 tablets (60 mg total) by mouth daily.   12 tablet   0   . sucralfate (CARAFATE) 1 GM/10ML suspension   Oral   Take 10 mLs (1 g total) by mouth 4 (four) times daily.   420 mL   0    Triage Vitals: BP 142/73  Pulse 80  Temp(Src) 98 F (36.7 C)  Resp 16  SpO2 100%  Physical Exam  Nursing note and vitals reviewed. Constitutional: She is oriented to person, place, and time. She appears well-developed and well-nourished. No distress.  Coughing on exam  HENT:  Head: Normocephalic and atraumatic.  Mouth/Throat: Oropharynx is clear and moist. No oropharyngeal exudate.  Eyes: Pupils are equal, round, and reactive to light.  Neck: Neck supple.  Cardiovascular: Normal rate, regular rhythm and normal heart sounds.   No murmur heard. Pulmonary/Chest: Effort normal. No respiratory distress. She has wheezes.  Diffuse expiratory wheezing  Abdominal: Soft. Bowel sounds are normal. There is no tenderness. There is no rebound and no guarding.  Musculoskeletal: She exhibits no edema.  Neurological: She is alert and oriented to person, place, and time.  Skin: Skin is warm and dry.  Psychiatric: She has a normal mood and affect.    ED Course  Procedures (including critical care time) DIAGNOSTIC STUDIES: Oxygen Saturation is 100% on room air, normal by my interpretation.    COORDINATION OF CARE: 12:05 PM-Discussed treatment plan which includes steroids and albuterol with pt at bedside and pt agreed to plan.    Medications  predniSONE (DELTASONE) tablet 60 mg (60 mg Oral Given 11/16/12 1223)  albuterol (PROVENTIL) (5 MG/ML) 0.5% nebulizer solution 2.5 mg (2.5 mg Nebulization Given 11/16/12 1226)    Labs Review Labs Reviewed - No data to display Imaging Review Dg Chest 2 View  11/16/2012   CLINICAL DATA:  Cough and shortness of breath  EXAM: CHEST  2 VIEW  COMPARISON:  06/14/2012  FINDINGS: The heart size and mediastinal contours are within normal limits.  The lungs are  hyperexpanded. There are stable thickened interstitial markings. No infiltrate or edema is seen. No pleural effusion or pneumothorax.  The bony thorax is demineralized but intact.  IMPRESSION: No acute cardiopulmonary disease.  COPD.   Electronically Signed   By: Renard Hamper.D.  On: 11/16/2012 11:46    EKG Interpretation   None       MDM   1. Bronchitis   2. COPD exacerbation    Patient with hx of COPD presents with SOB and cough.  Coughing and wheezing on exam.  SpO2 100%.  Patient given steroids and 1 duoneb.  Chest xray negative.  Reexam - clear BS bilaterally and patient endorses improvement.  Will be discharged home with abx and steroids for COPD exacerbation.  Patient also complained of right arm pain and GERD symptoms which are chronic for her.  Has been seen and is being managed by PCP for these.  Will follow-up with PCP regarding these symptoms.  After history, exam, and medical workup I feel the patient has been appropriately medically screened and is safe for discharge home. Pertinent diagnoses were discussed with the patient. Patient was given return precautions.  I personally performed the services described in this documentation, which was scribed in my presence. The recorded information has been reviewed and is accurate.    Shon Baton, MD 11/17/12 1902

## 2012-11-16 NOTE — ED Notes (Signed)
Instructions, prescriptions, and f/u information given, reviewed.  Verbalizes understanding.

## 2012-12-17 ENCOUNTER — Ambulatory Visit (INDEPENDENT_AMBULATORY_CARE_PROVIDER_SITE_OTHER): Payer: Medicaid Other | Admitting: Physician Assistant

## 2012-12-17 ENCOUNTER — Encounter: Payer: Self-pay | Admitting: Physician Assistant

## 2012-12-17 VITALS — BP 126/74 | HR 84 | Temp 98.3°F | Resp 18 | Wt 154.0 lb

## 2012-12-17 DIAGNOSIS — B9689 Other specified bacterial agents as the cause of diseases classified elsewhere: Secondary | ICD-10-CM

## 2012-12-17 DIAGNOSIS — J988 Other specified respiratory disorders: Secondary | ICD-10-CM

## 2012-12-17 DIAGNOSIS — H6690 Otitis media, unspecified, unspecified ear: Secondary | ICD-10-CM

## 2012-12-17 DIAGNOSIS — A499 Bacterial infection, unspecified: Secondary | ICD-10-CM

## 2012-12-17 MED ORDER — AZITHROMYCIN 250 MG PO TABS: ORAL_TABLET | ORAL | Status: DC

## 2012-12-17 NOTE — Progress Notes (Signed)
Patient ID: [name] MRN: [id number], DOB: [birth year], 52 y.o. Date of Encounter: 12/17/2012, 3:04 PM    Chief Complaint:  Chief Complaint  Patient presents with  . Having chest congestion, cough and ear pain  .      HPI: 52 y.o. year old female says that she has been sick for over one week. Says she was having discomfort in both ears but now the right seems to be worse than the left. Has pressure but then also at times has pain. Her nose is very congested but not getting much mucus out. She has chest congestion and cough with yellow-green phlegm. No fever or chills. No sore throat.     Home Meds: See attached medication section for any medications that were entered at today's visit. The computer does not put those onto this list.The following list is a list of meds entered prior to today's visit.   Current Outpatient Prescriptions on File Prior to Visit  Medication Sig Dispense Refill  . albuterol (PROVENTIL HFA;VENTOLIN HFA) 108 (90 BASE) MCG/ACT inhaler Inhale 2 puffs into the lungs every 4 (four) hours as needed for wheezing.      Marland Kitchen aspirin EC 81 MG tablet Take 81 mg by mouth daily.        . mirabegron ER (MYRBETRIQ) 25 MG TB24 tablet Take 1 tablet (25 mg total) by mouth daily.  30 tablet  2  . Multiple Vitamins-Minerals (MULTIVITAMINS THER. W/MINERALS) TABS Take 1 tablet by mouth daily.        . Omega-3 Fatty Acids (FISH OIL PO) Take 1 capsule by mouth 2 (two) times daily.       . ranitidine (ZANTAC) 75 MG tablet Take 75 mg by mouth daily.      . nitroGLYCERIN (NITROSTAT) 0.4 MG SL tablet Place 1 tablet (0.4 mg total) under the tongue every 5 (five) minutes as needed for chest pain.  25 tablet  3   No current facility-administered medications on file prior to visit.    Allergies:  Allergies  Allergen Reactions  . Codeine Nausea And Vomiting    Vicodin/Percocet Specified= excessive vomiting  . Sulfonamide Derivatives Hives  . Toviaz [Fesoterodine Fumarate Er]    Extremely dry mouth, felt hot like hotflashes. Felt dehydrated.      Review of Systems: See HPI for pertinent ROS. All other ROS negative.    Physical Exam: Blood pressure 126/74, pulse 84, temperature 98.3 F (36.8 C), temperature source Oral, resp. rate 18, weight 154 lb (69.854 kg)., Body mass index is 27.29 kg/(m^2). General: WNWD WF.  Appears in no acute distress. HEENT: Normocephalic, atraumatic, eyes without discharge, sclera non-icteric, nares are without discharge. Bilateral auditory canals clear. Left TM: The bottom is extremely cloudy and dull. The top half is crystal clear. Right TM: Has some area of erythema/inflammation, and slightly dull. Oral cavity moist, posterior pharynx without exudate, erythema, peritonsillar abscess, or post nasal drip. Sinuses: No tenderness with percussion.  Neck: Supple. No thyromegaly. No lymphadenopathy. Lungs: Clear bilaterally to auscultation without wheezes, rales, or rhonchi. Breathing is unlabored. Heart: Regular rhythm. No murmurs, rubs, or gallops. Msk:  Strength and tone normal for age. Extremities/Skin: Warm and dry. No clubbing or cyanosis. No edema. No rashes or suspicious lesions. Neuro: Alert and oriented X 3. Moves all extremities spontaneously. Gait is normal. CNII-XII grossly in tact. Psych:  Responds to questions appropriately with a normal affect.     ASSESSMENT AND PLAN:  52 y.o. year old female with  1. Bacterial respiratory infection - azithromycin (ZITHROMAX) 250 MG tablet; Day 1: Take 2 daily.   Days 2-5: Take 1 daily.  Dispense: 6 tablet; Refill: 0 Use otc decongestants to decrease the congestion and pressure in her ears and nasal area. Use Mucinex DM to help loosen phlegm. Followup if symptoms do not resolve. Melissa Garcia [place], [place], [facility] 12/17/2012 3:04 PM

## 2012-12-22 ENCOUNTER — Telehealth: Payer: Self-pay | Admitting: Physician Assistant

## 2012-12-24 ENCOUNTER — Encounter (HOSPITAL_COMMUNITY): Payer: Self-pay | Admitting: Pharmacy Technician

## 2012-12-29 ENCOUNTER — Encounter (HOSPITAL_COMMUNITY): Payer: Self-pay

## 2012-12-29 ENCOUNTER — Encounter (HOSPITAL_COMMUNITY)
Admission: RE | Admit: 2012-12-29 | Discharge: 2012-12-29 | Disposition: A | Discharge status: Home / Self Care | Payer: Medicaid Other | Source: Ambulatory Visit | Attending: Ophthalmology | Admitting: Ophthalmology

## 2012-12-29 NOTE — Patient Instructions (Signed)
Your procedure is scheduled on: 01/08/2013  Report to [facility] at  1000  AM.  Call this number if you have problems the morning of surgery: [phone]   Do not eat food or drink liquids :After Midnight.      Take these medicines the morning of surgery with A SIP OF WATER: myrbetrig, zantac. Take tour albuterol before you come and bring it with you.   Do not wear jewelry, make-up or nail polish.  Do not wear lotions, powders, or perfumes.   Do not shave 48 hours prior to surgery.  Do not bring valuables to the hospital.  Contacts, dentures or bridgework may not be worn into surgery.  Leave suitcase in the car. After surgery it may be brought to your room.  For patients admitted to the hospital, checkout time is 11:00 AM the day of discharge.   Patients discharged the day of surgery will not be allowed to drive home.  :     Please read over the following fact sheets that you were given: Coughing and Deep Breathing, Surgical Site Infection Prevention, Anesthesia Post-op Instructions and Care and Recovery After Surgery    Cataract A cataract is a clouding of the lens of the eye. When a lens becomes cloudy, vision is reduced based on the degree and nature of the clouding. Many cataracts reduce vision to some degree. Some cataracts make people more near-sighted as they develop. Other cataracts increase glare. Cataracts that are ignored and become worse can sometimes look white. The white color can be seen through the pupil. CAUSES   Aging. However, cataracts may occur at any age, even in newborns.   Certain drugs.   Trauma to the eye.   Certain diseases such as diabetes.   Specific eye diseases such as chronic inflammation inside the eye or a sudden attack of a rare form of glaucoma.   Inherited or acquired medical problems.  SYMPTOMS   Gradual, progressive drop in vision in the affected eye.   Severe, rapid visual loss. This most often happens when trauma is the cause.    DIAGNOSIS  To detect a cataract, an eye doctor examines the lens. Cataracts are best diagnosed with an exam of the eyes with the pupils enlarged (dilated) by drops.  TREATMENT  For an early cataract, vision may improve by using different eyeglasses or stronger lighting. If that does not help your vision, surgery is the only effective treatment. A cataract needs to be surgically removed when vision loss interferes with your everyday activities, such as driving, reading, or watching TV. A cataract may also have to be removed if it prevents examination or treatment of another eye problem. Surgery removes the cloudy lens and usually replaces it with a substitute lens (intraocular lens, IOL).  At a time when both you and your doctor agree, the cataract will be surgically removed. If you have cataracts in both eyes, only one is usually removed at a time. This allows the operated eye to heal and be out of danger from any possible problems after surgery (such as infection or poor wound healing). In rare cases, a cataract may be doing damage to your eye. In these cases, your caregiver may advise surgical removal right away. The vast majority of people who have cataract surgery have better vision afterward. HOME CARE INSTRUCTIONS  If you are not planning surgery, you may be asked to do the following:  Use different eyeglasses.   Use stronger or brighter lighting.  Ask your eye doctor about reducing your medicine dose or changing medicines if it is thought that a medicine caused your cataract. Changing medicines does not make the cataract go away on its own.   Become familiar with your surroundings. Poor vision can lead to injury. Avoid bumping into things on the affected side. You are at a higher risk for tripping or falling.   Exercise extreme care when driving or operating machinery.   Wear sunglasses if you are sensitive to bright light or experiencing problems with glare.  SEEK IMMEDIATE MEDICAL CARE  IF:   You have a worsening or sudden vision loss.   You notice redness, swelling, or increasing pain in the eye.   You have a fever.  Document Released: 01/22/2005 Document Revised: 01/11/2011 Document Reviewed: 09/15/2010 [facility] Patient Information 2012 [email or web address] INSTRUCTIONS POST-ANESTHESIA  IMMEDIATELY FOLLOWING SURGERY:  Do not drive or operate machinery for the first twenty four hours after surgery.  Do not make any important decisions for twenty four hours after surgery or while taking narcotic pain medications or sedatives.  If you develop intractable nausea and vomiting or a severe headache please notify your doctor immediately.  FOLLOW-UP:  Please make an appointment with your surgeon as instructed. You do not need to follow up with anesthesia unless specifically instructed to do so.  WOUND CARE INSTRUCTIONS (if applicable):  Keep a dry clean dressing on the anesthesia/puncture wound site if there is drainage.  Once the wound has quit draining you may leave it open to air.  Generally you should leave the bandage intact for twenty four hours unless there is drainage.  If the epidural site drains for more than 36-48 hours please call the anesthesia department.  QUESTIONS?:  Please feel free to call your physician or the hospital operator if you have any questions, and they will be happy to assist you.

## 2013-01-05 HISTORY — PX: EYE SURGERY: SHX253

## 2013-01-07 MED ORDER — CYCLOPENTOLATE-PHENYLEPHRINE OP SOLN OPTIME - NO CHARGE
OPHTHALMIC | Status: AC
  Filled 2013-01-07: qty 2

## 2013-01-07 MED ORDER — LIDOCAINE HCL (PF) 1 % IJ SOLN
INTRAMUSCULAR | Status: AC
  Filled 2013-01-07: qty 2

## 2013-01-07 MED ORDER — TETRACAINE HCL 0.5 % OP SOLN
OPHTHALMIC | Status: AC
  Filled 2013-01-07: qty 2

## 2013-01-07 MED ORDER — LIDOCAINE HCL 3.5 % OP GEL
OPHTHALMIC | Status: AC
  Filled 2013-01-07: qty 1

## 2013-01-07 MED ORDER — NEOMYCIN-POLYMYXIN-DEXAMETH 3.5-10000-0.1 OP SUSP
OPHTHALMIC | Status: AC
  Filled 2013-01-07: qty 5

## 2013-01-08 ENCOUNTER — Ambulatory Visit (HOSPITAL_COMMUNITY): Payer: Medicaid Other | Admitting: Anesthesiology

## 2013-01-08 ENCOUNTER — Ambulatory Visit (HOSPITAL_COMMUNITY)
Admission: RE | Admit: 2013-01-08 | Discharge: 2013-01-08 | Disposition: A | Discharge status: Home / Self Care | Payer: Medicaid Other | Source: Ambulatory Visit | Attending: Ophthalmology | Admitting: Ophthalmology

## 2013-01-08 ENCOUNTER — Encounter (HOSPITAL_COMMUNITY)
Admission: RE | Disposition: A | Discharge status: Home / Self Care | Payer: Self-pay | Source: Ambulatory Visit | Attending: Ophthalmology

## 2013-01-08 ENCOUNTER — Encounter (HOSPITAL_COMMUNITY): Payer: Medicaid Other | Admitting: Anesthesiology

## 2013-01-08 ENCOUNTER — Encounter (HOSPITAL_COMMUNITY): Payer: Self-pay | Admitting: *Deleted

## 2013-01-08 DIAGNOSIS — I10 Essential (primary) hypertension: Secondary | ICD-10-CM | POA: Insufficient documentation

## 2013-01-08 DIAGNOSIS — J4489 Other specified chronic obstructive pulmonary disease: Secondary | ICD-10-CM | POA: Insufficient documentation

## 2013-01-08 DIAGNOSIS — H2510 Age-related nuclear cataract, unspecified eye: Secondary | ICD-10-CM | POA: Insufficient documentation

## 2013-01-08 DIAGNOSIS — J449 Chronic obstructive pulmonary disease, unspecified: Secondary | ICD-10-CM | POA: Insufficient documentation

## 2013-01-08 HISTORY — PX: CATARACT EXTRACTION W/PHACO: SHX586

## 2013-01-08 SURGERY — PHACOEMULSIFICATION, CATARACT, WITH IOL INSERTION
Anesthesia: Monitor Anesthesia Care | Site: Eye | Laterality: Right

## 2013-01-08 MED ORDER — EPINEPHRINE HCL 1 MG/ML IJ SOLN
INTRAMUSCULAR | Status: AC
  Filled 2013-01-08: qty 1

## 2013-01-08 MED ORDER — MIDAZOLAM HCL 2 MG/2ML IJ SOLN
INTRAMUSCULAR | Status: AC
  Filled 2013-01-08: qty 2

## 2013-01-08 MED ORDER — LIDOCAINE HCL (PF) 1 % IJ SOLN
INTRAMUSCULAR | Status: DC | PRN
  Administered 2013-01-08: .4 mL

## 2013-01-08 MED ORDER — FENTANYL CITRATE 0.05 MG/ML IJ SOLN
INTRAMUSCULAR | Status: AC
  Filled 2013-01-08: qty 2

## 2013-01-08 MED ORDER — POVIDONE-IODINE 5 % OP SOLN
OPHTHALMIC | Status: DC | PRN
  Administered 2013-01-08: 1 via OPHTHALMIC

## 2013-01-08 MED ORDER — CYCLOPENTOLATE-PHENYLEPHRINE 0.2-1 % OP SOLN
1.0000 [drp] | OPHTHALMIC | Status: AC
  Administered 2013-01-08 (×3): 1 [drp] via OPHTHALMIC

## 2013-01-08 MED ORDER — TETRACAINE HCL 0.5 % OP SOLN
1.0000 [drp] | OPHTHALMIC | Status: AC
  Administered 2013-01-08 (×3): 1 [drp] via OPHTHALMIC

## 2013-01-08 MED ORDER — PROVISC 10 MG/ML IO SOLN
INTRAOCULAR | Status: DC | PRN
  Administered 2013-01-08: 0.85 mL via INTRAOCULAR

## 2013-01-08 MED ORDER — FENTANYL CITRATE 0.05 MG/ML IJ SOLN
25.0000 ug | INTRAMUSCULAR | Status: AC
  Administered 2013-01-08: 25 ug via INTRAVENOUS

## 2013-01-08 MED ORDER — LACTATED RINGERS IV SOLN
INTRAVENOUS | Status: DC
  Administered 2013-01-08: 1000 mL via INTRAVENOUS

## 2013-01-08 MED ORDER — EPINEPHRINE HCL 1 MG/ML IJ SOLN
INTRAOCULAR | Status: DC | PRN
  Administered 2013-01-08: 11:00:00

## 2013-01-08 MED ORDER — BSS IO SOLN
INTRAOCULAR | Status: DC | PRN
  Administered 2013-01-08: 15 mL via INTRAOCULAR

## 2013-01-08 MED ORDER — MIDAZOLAM HCL 2 MG/2ML IJ SOLN
1.0000 mg | INTRAMUSCULAR | Status: DC | PRN
  Administered 2013-01-08: 2 mg via INTRAVENOUS

## 2013-01-08 MED ORDER — PHENYLEPHRINE HCL 2.5 % OP SOLN
1.0000 [drp] | OPHTHALMIC | Status: AC
  Administered 2013-01-08 (×3): 1 [drp] via OPHTHALMIC

## 2013-01-08 MED ORDER — LIDOCAINE HCL 3.5 % OP GEL
1.0000 "application " | Freq: Once | OPHTHALMIC | Status: AC
  Administered 2013-01-08: 1 via OPHTHALMIC

## 2013-01-08 SURGICAL SUPPLY — 32 items
CAPSULAR TENSION RING-AMO (OPHTHALMIC RELATED) IMPLANT
CLOTH BEACON ORANGE TIMEOUT ST (SAFETY) ×2 IMPLANT
EYE SHIELD UNIVERSAL CLEAR (GAUZE/BANDAGES/DRESSINGS) ×2 IMPLANT
GLOVE BIO SURGEON STRL SZ 6.5 (GLOVE) IMPLANT
GLOVE BIOGEL PI IND STRL 6.5 (GLOVE) IMPLANT
GLOVE BIOGEL PI IND STRL 7.0 (GLOVE) ×2 IMPLANT
GLOVE BIOGEL PI IND STRL 7.5 (GLOVE) IMPLANT
GLOVE BIOGEL PI INDICATOR 6.5 (GLOVE)
GLOVE BIOGEL PI INDICATOR 7.0 (GLOVE) ×2
GLOVE BIOGEL PI INDICATOR 7.5 (GLOVE)
GLOVE ECLIPSE 6.5 STRL STRAW (GLOVE) IMPLANT
GLOVE ECLIPSE 7.0 STRL STRAW (GLOVE) IMPLANT
GLOVE ECLIPSE 7.5 STRL STRAW (GLOVE) IMPLANT
GLOVE EXAM NITRILE LRG STRL (GLOVE) IMPLANT
GLOVE EXAM NITRILE MD LF STRL (GLOVE) IMPLANT
GLOVE SKINSENSE NS SZ6.5 (GLOVE)
GLOVE SKINSENSE NS SZ7.0 (GLOVE)
GLOVE SKINSENSE STRL SZ6.5 (GLOVE) IMPLANT
GLOVE SKINSENSE STRL SZ7.0 (GLOVE) IMPLANT
KIT VITRECTOMY (OPHTHALMIC RELATED) IMPLANT
PAD ARMBOARD 7.5X6 YLW CONV (MISCELLANEOUS) ×2 IMPLANT
PROC W NO LENS (INTRAOCULAR LENS)
PROC W SPEC LENS (INTRAOCULAR LENS)
PROCESS W NO LENS (INTRAOCULAR LENS) IMPLANT
PROCESS W SPEC LENS (INTRAOCULAR LENS) IMPLANT
RING MALYGIN (MISCELLANEOUS) IMPLANT
SIGHTPATH CAT PROC W REG LENS (Ophthalmic Related) ×2 IMPLANT
SYR TB 1ML LL NO SAFETY (SYRINGE) ×2 IMPLANT
TAPE SURG TRANSPORE 1 IN (GAUZE/BANDAGES/DRESSINGS) ×1 IMPLANT
TAPE SURGICAL TRANSPORE 1 IN (GAUZE/BANDAGES/DRESSINGS) ×1
VISCOELASTIC ADDITIONAL (OPHTHALMIC RELATED) IMPLANT
WATER STERILE IRR 250ML POUR (IV SOLUTION) ×2 IMPLANT

## 2013-01-08 NOTE — H&P (Signed)
I have reviewed the H&P, the patient was re-examined, and I have identified no interval changes in medical condition and plan of care since the history and physical of record  

## 2013-01-08 NOTE — Transfer of Care (Signed)
Immediate Anesthesia Transfer of Care Note  Patient: Melissa Garcia  Procedure(s) Performed: Procedure(s) with comments: CATARACT EXTRACTION PHACO AND INTRAOCULAR LENS PLACEMENT (IOC) (Right) - CDE 9.04  Patient Location: Short Stay  Anesthesia Type:MAC  Level of Consciousness: awake, alert , oriented and patient cooperative  Airway & Oxygen Therapy: Patient Spontanous Breathing  Post-op Assessment: Report given to PACU RN, Post -op Vital signs reviewed and stable and Patient moving all extremities  Post vital signs: Reviewed and stable  Complications: No apparent anesthesia complications

## 2013-01-08 NOTE — Anesthesia Preprocedure Evaluation (Addendum)
Anesthesia Evaluation  Patient identified by MRN, date of birth, ID band Patient awake    Reviewed: Allergy & Precautions, H&P , NPO status , Patient's Chart, lab work & pertinent test results  Airway Mallampati: II TM Distance: >3 FB     Dental  (+) Poor Dentition   Pulmonary COPDCurrent Smoker,  breath sounds clear to auscultation        Cardiovascular hypertension, Pt. on medications + CAD and + Past MI Rhythm:Regular Rate:Normal     Neuro/Psych PSYCHIATRIC DISORDERS Depression Bipolar Disorder    GI/Hepatic GERD-  ,  Endo/Other    Renal/GU      Musculoskeletal   Abdominal   Peds  Hematology   Anesthesia Other Findings   Reproductive/Obstetrics                           Anesthesia Physical Anesthesia Plan  ASA: III  Anesthesia Plan: MAC   Post-op Pain Management:    Induction: Intravenous  Airway Management Planned: Nasal Cannula  Additional Equipment:   Intra-op Plan:   Post-operative Plan:   Informed Consent: I have reviewed the patients History and Physical, chart, labs and discussed the procedure including the risks, benefits and alternatives for the proposed anesthesia with the patient or authorized representative who has indicated his/her understanding and acceptance.     Plan Discussed with:   Anesthesia Plan Comments:         Anesthesia Quick Evaluation  

## 2013-01-08 NOTE — Op Note (Signed)
Date of Admission: 01/08/2013  Date of Surgery: 01/08/2013  Pre-Op Dx: Cataract  Right  Eye  Post-Op Dx: Cataract  Right  Eye,  Dx Code 366.16   Surgeon: Gemma Payor, M.D.  Assistants: None  Anesthesia: Topical with MAC  Indications: Painless, progressive loss of vision with compromise of daily activities.  Surgery: Cataract Extraction with Intraocular lens Implant Right Eye  Discription: The patient had dilating drops and viscous lidocaine placed into the right eye in the pre-op holding area. After transfer to the operating room, a time out was performed. The patient was then prepped and draped. Beginning with a 75 degree blade a paracentesis port was made at the surgeon's 2 o'clock position. The anterior chamber was then filled with 1% non-preserved lidocaine. This was followed by filling the anterior chamber with Provisc.  A 2.84mm keratome blade was used to make a clear corneal incision at the temporal limbus.  A bent cystatome needle was used to create a continuous tear capsulotomy. Hydrodissection was performed with balanced salt solution on a Fine canula. The lens nucleus was then removed using the phacoemulsification handpiece. Residual cortex was removed with the I&A handpiece. The anterior chamber and capsular bag were refilled with Provisc. A posterior chamber intraocular lens was placed into the capsular bag with it's injector. The implant was positioned with the Kuglan hook. The Provisc was then removed from the anterior chamber and capsular bag with the I&A handpiece. Stromal hydration of the main incision and paracentesis port was performed with BSS on a Fine canula. The wounds were tested for leak which was negative. The patient tolerated the procedure well. There were no operative complications. The patient was then transferred to the recovery room in stable condition.  Complications: None  Specimen: None  EBL: None  Prosthetic device: B&L enVista, MX60, power 13.0D, SN  [id number].

## 2013-01-08 NOTE — Anesthesia Postprocedure Evaluation (Signed)
  Anesthesia Post-op Note  Patient: Melissa Garcia  Procedure(s) Performed: Procedure(s) with comments: CATARACT EXTRACTION PHACO AND INTRAOCULAR LENS PLACEMENT (IOC) (Right) - CDE 9.04  Patient Location: Short Stay  Anesthesia Type:MAC  Level of Consciousness: awake, alert , oriented and patient cooperative  Airway and Oxygen Therapy: Patient Spontanous Breathing  Post-op Pain: none  Post-op Assessment: Post-op Vital signs reviewed, Patient's Cardiovascular Status Stable, Respiratory Function Stable, Patent Airway and Pain level controlled  Post-op Vital Signs: Reviewed and stable  Complications: No apparent anesthesia complications

## 2013-01-12 ENCOUNTER — Encounter (HOSPITAL_COMMUNITY): Payer: Self-pay | Admitting: Ophthalmology

## 2013-01-12 ENCOUNTER — Ambulatory Visit (INDEPENDENT_AMBULATORY_CARE_PROVIDER_SITE_OTHER): Payer: Medicaid Other | Admitting: Family Medicine

## 2013-01-12 VITALS — BP 126/70 | HR 86 | Temp 100.1°F | Resp 20 | Ht 63.0 in | Wt 159.0 lb

## 2013-01-12 DIAGNOSIS — R509 Fever, unspecified: Secondary | ICD-10-CM

## 2013-01-12 DIAGNOSIS — J019 Acute sinusitis, unspecified: Secondary | ICD-10-CM

## 2013-01-12 LAB — INFLUENZA A AND B
Inflenza A Ag: NEGATIVE
Influenza B Ag: NEGATIVE

## 2013-01-12 MED ORDER — FLUTICASONE PROPIONATE 50 MCG/ACT NA SUSP: 2.0000 | Freq: Every day | NASAL | Status: DC

## 2013-01-12 MED ORDER — CEFDINIR 300 MG PO CAPS: 300.0000 mg | ORAL_CAPSULE | Freq: Two times a day (BID) | ORAL | Status: DC

## 2013-01-12 MED ORDER — NEOMYCIN-POLYMYXIN-DEXAMETH 3.5-10000-0.1 OP SUSP
OPHTHALMIC | Status: DC | PRN
  Administered 2013-01-08: 2 [drp] via OPHTHALMIC

## 2013-01-12 NOTE — Progress Notes (Signed)
Subjective:    Patient ID: Melissa Garcia, female    DOB: [birth year], 52 y.o.   MRN: [id number]  HPI Patient symptoms began abruptly 48 hours ago.  Characterized by fever of 100 101, frontal sinus pressure, frontal sinus pain, purulent nasal discharge, severe pain in her left ear, and myalgias. She's having occasional cough productive of yellow sputum but she thinks this is postnasal drip and drainage. She denies any sore throat. She has an extensive past medical history. Past Medical History  Diagnosis Date  . Coronary atherosclerosis of native coronary artery     DES LAD and PTCA diagonal 8/10, LVEF 30% 8/10  . Myocardial infarct     AMI 8/10  . Essential hypertension, benign   . Urinary incontinence   . Bipolar disorder   . COPD (chronic obstructive pulmonary disease)   . Eczema   . Stress incontinence    Current Outpatient Prescriptions on File Prior to Visit  Medication Sig Dispense Refill  . albuterol (PROVENTIL HFA;VENTOLIN HFA) 108 (90 BASE) MCG/ACT inhaler Inhale 2 puffs into the lungs every 4 (four) hours as needed for wheezing.      . mirabegron ER (MYRBETRIQ) 25 MG TB24 tablet Take 1 tablet (25 mg total) by mouth daily.  30 tablet  2  . Multiple Vitamins-Minerals (MULTIVITAMINS THER. W/MINERALS) TABS Take 1 tablet by mouth daily.        . nitroGLYCERIN (NITROSTAT) 0.4 MG SL tablet Place 1 tablet (0.4 mg total) under the tongue every 5 (five) minutes as needed for chest pain.  25 tablet  3  . Omega-3 Fatty Acids (FISH OIL PO) Take 1 capsule by mouth 2 (two) times daily.       . ranitidine (ZANTAC) 75 MG tablet Take 75 mg by mouth daily.      Marland Kitchen aspirin EC 81 MG tablet Take 81 mg by mouth daily.         No current facility-administered medications on file prior to visit.   Allergies  Allergen Reactions  . Codeine Nausea And Vomiting    Vicodin/Percocet Specified= excessive vomiting  . Sulfonamide Derivatives Hives  . Toviaz [Fesoterodine Fumarate Er]     Extremely dry  mouth, felt hot like hotflashes. Felt dehydrated.   History   Social History  . Marital Status: Single    Spouse Name: N/A    Number of Children: N/A  . Years of Education: N/A   Occupational History  . Disabled    Social History Main Topics  . Smoking status: Current Some Day Smoker -- 0.25 packs/day for 30 years    Types: Cigarettes  . Smokeless tobacco: Former Neurosurgeon    Quit date: 05/23/2012  . Alcohol Use: No  . Drug Use: No  . Sexual Activity: Yes    Birth Control/ Protection: None   Other Topics Concern  . Not on file   Social History Narrative  . No narrative on file      Review of Systems  All other systems reviewed and are negative.       Objective:   Physical Exam  Vitals reviewed. Constitutional: She appears well-developed and well-nourished.  HENT:  Right Ear: Tympanic membrane, external ear and ear canal normal.  Left Ear: External ear normal. Tympanic membrane is injected, erythematous and bulging.  Nose: Mucosal edema and rhinorrhea present. Right sinus exhibits frontal sinus tenderness. Left sinus exhibits frontal sinus tenderness.  Mouth/Throat: Oropharynx is clear and moist. No oropharyngeal exudate.  Eyes: Conjunctivae are normal.  Pupils are equal, round, and reactive to light.  Neck: Neck supple.  Cardiovascular: Normal rate, regular rhythm and normal heart sounds.  Exam reveals no gallop and no friction rub.   No murmur heard. Pulmonary/Chest: Effort normal and breath sounds normal. No respiratory distress. She has no wheezes. She has no rales.  Lymphadenopathy:    She has no cervical adenopathy.          Assessment & Plan:  1. Fever, unspecified Flu test is negative - Influenza a and b  2. Acute rhinosinusitis Blue the patient has bacterial rhinosinusitis given the severity of her exam as well as a left otitis media. Begin Omnicef 300 mg by mouth twice a day for 10 days. Given her history of coronary artery disease I recommend that  you have only Sudafed and other decongestants. Therefore will try Flonase 2 sprays each nostril daily. Recheck in 1 week if no better or sooner if worse. - cefdinir (OMNICEF) 300 MG capsule; Take 1 capsule (300 mg total) by mouth 2 (two) times daily.  Dispense: 20 capsule; Refill: 0 - fluticasone (FLONASE) 50 MCG/ACT nasal spray; Place 2 sprays into both nostrils daily.  Dispense: 16 g; Refill: 6

## 2013-01-19 ENCOUNTER — Encounter (HOSPITAL_COMMUNITY): Payer: Self-pay | Admitting: Pharmacy Technician

## 2013-01-19 MED ORDER — FENTANYL CITRATE 0.05 MG/ML IJ SOLN: 25.0000 ug | INTRAMUSCULAR | Status: DC | PRN

## 2013-01-19 MED ORDER — ONDANSETRON HCL 4 MG/2ML IJ SOLN: 4.0000 mg | Freq: Once | INTRAMUSCULAR | Status: AC | PRN

## 2013-01-20 ENCOUNTER — Encounter (HOSPITAL_COMMUNITY)
Admission: RE | Admit: 2013-01-20 | Discharge: 2013-01-20 | Disposition: A | Discharge status: Home / Self Care | Payer: Medicaid Other | Source: Ambulatory Visit | Attending: Ophthalmology | Admitting: Ophthalmology

## 2013-01-26 ENCOUNTER — Ambulatory Visit (HOSPITAL_COMMUNITY)
Admission: RE | Admit: 2013-01-26 | Discharge: 2013-01-26 | Disposition: A | Discharge status: Home / Self Care | Payer: Medicaid Other | Source: Ambulatory Visit | Attending: Ophthalmology | Admitting: Ophthalmology

## 2013-01-26 ENCOUNTER — Encounter (HOSPITAL_COMMUNITY): Payer: Self-pay

## 2013-01-26 ENCOUNTER — Encounter (HOSPITAL_COMMUNITY)
Admission: RE | Disposition: A | Discharge status: Home / Self Care | Payer: Self-pay | Source: Ambulatory Visit | Attending: Ophthalmology

## 2013-01-26 ENCOUNTER — Ambulatory Visit (HOSPITAL_COMMUNITY): Payer: Medicaid Other | Admitting: Anesthesiology

## 2013-01-26 ENCOUNTER — Encounter (HOSPITAL_COMMUNITY): Payer: Medicaid Other | Admitting: Anesthesiology

## 2013-01-26 DIAGNOSIS — H2510 Age-related nuclear cataract, unspecified eye: Secondary | ICD-10-CM | POA: Insufficient documentation

## 2013-01-26 DIAGNOSIS — J4489 Other specified chronic obstructive pulmonary disease: Secondary | ICD-10-CM | POA: Insufficient documentation

## 2013-01-26 DIAGNOSIS — I10 Essential (primary) hypertension: Secondary | ICD-10-CM | POA: Insufficient documentation

## 2013-01-26 DIAGNOSIS — J449 Chronic obstructive pulmonary disease, unspecified: Secondary | ICD-10-CM | POA: Insufficient documentation

## 2013-01-26 HISTORY — PX: CATARACT EXTRACTION W/PHACO: SHX586

## 2013-01-26 SURGERY — PHACOEMULSIFICATION, CATARACT, WITH IOL INSERTION
Anesthesia: Monitor Anesthesia Care | Site: Eye | Laterality: Left

## 2013-01-26 MED ORDER — FENTANYL CITRATE 0.05 MG/ML IJ SOLN: 25.0000 ug | INTRAMUSCULAR | Status: DC | PRN

## 2013-01-26 MED ORDER — POVIDONE-IODINE 5 % OP SOLN
OPHTHALMIC | Status: DC | PRN
  Administered 2013-01-26: 1 via OPHTHALMIC

## 2013-01-26 MED ORDER — PROVISC 10 MG/ML IO SOLN
INTRAOCULAR | Status: DC | PRN
  Administered 2013-01-26: 0.85 mL via INTRAOCULAR

## 2013-01-26 MED ORDER — EPINEPHRINE HCL 1 MG/ML IJ SOLN
INTRAOCULAR | Status: DC | PRN
  Administered 2013-01-26: 13:00:00

## 2013-01-26 MED ORDER — MIDAZOLAM HCL 2 MG/2ML IJ SOLN
INTRAMUSCULAR | Status: AC
  Filled 2013-01-26: qty 2

## 2013-01-26 MED ORDER — LACTATED RINGERS IV SOLN
INTRAVENOUS | Status: DC
  Administered 2013-01-26: 12:00:00 via INTRAVENOUS

## 2013-01-26 MED ORDER — FENTANYL CITRATE 0.05 MG/ML IJ SOLN
25.0000 ug | INTRAMUSCULAR | Status: AC
  Administered 2013-01-26: 25 ug via INTRAVENOUS

## 2013-01-26 MED ORDER — PHENYLEPHRINE HCL 2.5 % OP SOLN
1.0000 [drp] | OPHTHALMIC | Status: AC
  Administered 2013-01-26 (×3): 1 [drp] via OPHTHALMIC

## 2013-01-26 MED ORDER — TETRACAINE HCL 0.5 % OP SOLN
1.0000 [drp] | OPHTHALMIC | Status: AC
  Administered 2013-01-26 (×3): 1 [drp] via OPHTHALMIC

## 2013-01-26 MED ORDER — LIDOCAINE 3.5 % OP GEL OPTIME - NO CHARGE
OPHTHALMIC | Status: DC | PRN
  Administered 2013-01-26: 2 [drp] via OPHTHALMIC

## 2013-01-26 MED ORDER — LIDOCAINE HCL (PF) 1 % IJ SOLN
INTRAMUSCULAR | Status: DC | PRN
  Administered 2013-01-26: .4 mL

## 2013-01-26 MED ORDER — CYCLOPENTOLATE-PHENYLEPHRINE 0.2-1 % OP SOLN
1.0000 [drp] | OPHTHALMIC | Status: AC
  Administered 2013-01-26 (×3): 1 [drp] via OPHTHALMIC

## 2013-01-26 MED ORDER — NEOMYCIN-POLYMYXIN-DEXAMETH 3.5-10000-0.1 OP SUSP
OPHTHALMIC | Status: DC | PRN
  Administered 2013-01-26: 2 [drp] via OPHTHALMIC

## 2013-01-26 MED ORDER — ONDANSETRON HCL 4 MG/2ML IJ SOLN: 4.0000 mg | Freq: Once | INTRAMUSCULAR | Status: DC | PRN

## 2013-01-26 MED ORDER — MIDAZOLAM HCL 2 MG/2ML IJ SOLN
1.0000 mg | INTRAMUSCULAR | Status: DC | PRN
  Administered 2013-01-26: 2 mg via INTRAVENOUS

## 2013-01-26 MED ORDER — FENTANYL CITRATE 0.05 MG/ML IJ SOLN
INTRAMUSCULAR | Status: AC
  Filled 2013-01-26: qty 2

## 2013-01-26 MED ORDER — BSS IO SOLN
INTRAOCULAR | Status: DC | PRN
  Administered 2013-01-26: 15 mL via INTRAOCULAR

## 2013-01-26 SURGICAL SUPPLY — 30 items
CLOTH BEACON ORANGE TIMEOUT ST (SAFETY) ×2 IMPLANT
EYE SHIELD UNIVERSAL CLEAR (GAUZE/BANDAGES/DRESSINGS) ×2 IMPLANT
GLOVE BIO SURGEON STRL SZ 6.5 (GLOVE) IMPLANT
GLOVE BIOGEL PI IND STRL 6.5 (GLOVE) ×1 IMPLANT
GLOVE BIOGEL PI IND STRL 7.0 (GLOVE) ×1 IMPLANT
GLOVE BIOGEL PI IND STRL 7.5 (GLOVE) IMPLANT
GLOVE BIOGEL PI INDICATOR 6.5 (GLOVE) ×1
GLOVE BIOGEL PI INDICATOR 7.0 (GLOVE) ×1
GLOVE BIOGEL PI INDICATOR 7.5 (GLOVE)
GLOVE ECLIPSE 6.5 STRL STRAW (GLOVE) IMPLANT
GLOVE ECLIPSE 7.0 STRL STRAW (GLOVE) IMPLANT
GLOVE ECLIPSE 7.5 STRL STRAW (GLOVE) IMPLANT
GLOVE EXAM NITRILE LRG STRL (GLOVE) IMPLANT
GLOVE EXAM NITRILE MD LF STRL (GLOVE) IMPLANT
GLOVE SKINSENSE NS SZ6.5 (GLOVE)
GLOVE SKINSENSE NS SZ7.0 (GLOVE)
GLOVE SKINSENSE STRL SZ6.5 (GLOVE) IMPLANT
GLOVE SKINSENSE STRL SZ7.0 (GLOVE) IMPLANT
KIT VITRECTOMY (OPHTHALMIC RELATED) IMPLANT
PAD ARMBOARD 7.5X6 YLW CONV (MISCELLANEOUS) ×2 IMPLANT
PROC W NO LENS (INTRAOCULAR LENS)
PROC W SPEC LENS (INTRAOCULAR LENS)
PROCESS W NO LENS (INTRAOCULAR LENS) IMPLANT
PROCESS W SPEC LENS (INTRAOCULAR LENS) IMPLANT
RING MALYGIN (MISCELLANEOUS) IMPLANT
SIGHTPATH CAT PROC W REG LENS (Ophthalmic Related) ×2 IMPLANT
SYR TB 1ML LL NO SAFETY (SYRINGE) ×2 IMPLANT
TAPE SURG TRANSPORE 1 IN (GAUZE/BANDAGES/DRESSINGS) ×1 IMPLANT
TAPE SURGICAL TRANSPORE 1 IN (GAUZE/BANDAGES/DRESSINGS) ×1
WATER STERILE IRR 250ML POUR (IV SOLUTION) ×2 IMPLANT

## 2013-01-26 NOTE — Anesthesia Preprocedure Evaluation (Signed)
Anesthesia Evaluation  Patient identified by MRN, date of birth, ID band Patient awake    Reviewed: Allergy & Precautions, H&P , NPO status , Patient's Chart, lab work & pertinent test results  Airway Mallampati: II TM Distance: >3 FB     Dental  (+) Poor Dentition   Pulmonary COPDCurrent Smoker,  breath sounds clear to auscultation        Cardiovascular hypertension, Pt. on medications + CAD and + Past MI Rhythm:Regular Rate:Normal     Neuro/Psych PSYCHIATRIC DISORDERS Depression Bipolar Disorder    GI/Hepatic GERD-  ,  Endo/Other    Renal/GU      Musculoskeletal   Abdominal   Peds  Hematology   Anesthesia Other Findings   Reproductive/Obstetrics                           Anesthesia Physical Anesthesia Plan  ASA: III  Anesthesia Plan: MAC   Post-op Pain Management:    Induction: Intravenous  Airway Management Planned: Nasal Cannula  Additional Equipment:   Intra-op Plan:   Post-operative Plan:   Informed Consent: I have reviewed the patients History and Physical, chart, labs and discussed the procedure including the risks, benefits and alternatives for the proposed anesthesia with the patient or authorized representative who has indicated his/her understanding and acceptance.     Plan Discussed with:   Anesthesia Plan Comments:         Anesthesia Quick Evaluation

## 2013-01-26 NOTE — Anesthesia Postprocedure Evaluation (Signed)
  Anesthesia Post-op Note  Patient: [name]  Procedure(s) Performed: Procedure(s) (LRB): LEFT EYE CATARACT EXTRACTION PHACO AND INTRAOCULAR LENS PLACEMENT  CDE=6.68 (Left)  Patient Location:  Short Stay  Anesthesia Type: MAC  Level of Consciousness: awake  Airway and Oxygen Therapy: Patient Spontanous Breathing  Post-op Pain: none  Post-op Assessment: Post-op Vital signs reviewed, Patient's Cardiovascular Status Stable, Respiratory Function Stable, Patent Airway, No signs of Nausea or vomiting and Pain level controlled  Post-op Vital Signs: Reviewed and stable  Complications: No apparent anesthesia complications

## 2013-01-26 NOTE — H&P (Signed)
I have reviewed the H&P, the patient was re-examined, and I have identified no interval changes in medical condition and plan of care since the history and physical of record  

## 2013-01-26 NOTE — Anesthesia Procedure Notes (Signed)
Procedure Name: MAC Date/Time: 01/26/2013 12:48 PM Performed by: Franco Nones Pre-anesthesia Checklist: Patient identified, Emergency Drugs available, Suction available, Timeout performed and Patient being monitored Patient Re-evaluated:Patient Re-evaluated prior to inductionOxygen Delivery Method: Nasal Cannula

## 2013-01-26 NOTE — Op Note (Signed)
Date of Admission: 01/26/2013  Date of Surgery: 01/26/2013  Pre-Op Dx: Cataract  Left  Eye  Post-Op Dx: Cataract  Left  Eye,  Dx Code 366.16  Surgeon: Gemma Payor, M.D.  Assistants: None  Anesthesia: Topical with MAC  Indications: Painless, progressive loss of vision with compromise of daily activities.  Surgery: Cataract Extraction with Intraocular lens Implant Left Eye  Discription: The patient had dilating drops and viscous lidocaine placed into the left eye in the pre-op holding area. After transfer to the operating room, a time out was performed. The patient was then prepped and draped. Beginning with a 75 degree blade a paracentesis port was made at the surgeon's 2 o'clock position. The anterior chamber was then filled with 1% non-preserved lidocaine. This was followed by filling the anterior chamber with Provisc. A 2.64mm keratome blade was used to make a clear corneal incision at the temporal limbus. A bent cystatome needle was used to create a continuous tear capsulotomy. Hydrodissection was performed with balanced salt solution on a Fine canula. The lens nucleus was then removed using the phacoemulsification handpiece. Residual cortex was removed with the I&A handpiece. The anterior chamber and capsular bag were refilled with Provisc. A posterior chamber intraocular lens was placed into the capsular bag with it's injector. The implant was positioned with the Kuglan hook. The Provisc was then removed from the anterior chamber and capsular bag with the I&A handpiece. Stromal hydration of the main incision and paracentesis port was performed with BSS on a Fine canula. The wounds were tested for leak which was negative. The patient tolerated the procedure well. There were no operative complications. The patient was then transferred to the recovery room in stable condition.  Complications: None  Specimen: None  EBL: None  Prosthetic device: B&L enVista, MX60, power 14.0D, SN  [id number].

## 2013-01-26 NOTE — Transfer of Care (Signed)
Immediate Anesthesia Transfer of Care Note  Patient: Melissa Garcia  Procedure(s) Performed: Procedure(s) (LRB): LEFT EYE CATARACT EXTRACTION PHACO AND INTRAOCULAR LENS PLACEMENT  CDE=6.68 (Left)  Patient Location: Shortstay  Anesthesia Type: MAC  Level of Consciousness: awake  Airway & Oxygen Therapy: Patient Spontanous Breathing   Post-op Assessment: Report given to PACU RN, Post -op Vital signs reviewed and stable and Patient moving all extremities  Post vital signs: Reviewed and stable  Complications: No apparent anesthesia complications

## 2013-01-27 ENCOUNTER — Encounter (HOSPITAL_COMMUNITY): Payer: Self-pay | Admitting: Ophthalmology

## 2013-01-27 NOTE — Addendum Note (Signed)
Addendum created 01/27/13 0844 by Fabienne Bruns, RN   Modules edited: Anesthesia Responsible Staff

## 2013-02-02 ENCOUNTER — Encounter: Payer: Self-pay | Admitting: Physician Assistant

## 2013-02-02 ENCOUNTER — Ambulatory Visit (INDEPENDENT_AMBULATORY_CARE_PROVIDER_SITE_OTHER): Payer: Medicaid Other | Admitting: Physician Assistant

## 2013-02-02 VITALS — BP 132/80 | HR 76 | Temp 98.2°F | Resp 18 | Wt 155.0 lb

## 2013-02-02 DIAGNOSIS — J988 Other specified respiratory disorders: Secondary | ICD-10-CM

## 2013-02-02 DIAGNOSIS — A499 Bacterial infection, unspecified: Secondary | ICD-10-CM

## 2013-02-02 MED ORDER — LEVOFLOXACIN 750 MG PO TABS: 750.0000 mg | ORAL_TABLET | Freq: Every day | ORAL | Status: DC

## 2013-02-02 MED ORDER — PREDNISONE 20 MG PO TABS: ORAL_TABLET | ORAL | Status: DC

## 2013-02-02 NOTE — Progress Notes (Signed)
Patient ID: MASAYE GATCHALIAN MRN: [id number], DOB: March 28, 1960, 52 y.o. Date of Encounter: 02/02/2013, 11:02 AM    Chief Complaint:  Chief Complaint  Patient presents with  . 3 mth check up    is fasting, still ahs cough and ear ache     HPI: 52 y.o. year old white female is here for followup.   She was seen here on 01/12/13. Was treated with Omnicef for 10 days. She says that even after completion of the antibiotic, her symptoms have not resolved. SHe continued to have nasal congestion and continued to have cough productive of thick dark phlegm. She says that her nose was so stopped up that she really was not blowing out anything. As well her left ear continued to bother her. She says that she's continued to have all the same symptoms.  I saw the above states that she is also here for three-month checkup. I reviewed her visit with me on September 2014 at which time she reported multiple myalgias and joint pains. I did extensive lab work. All of the labs were negative. Patient tells me that all of his aches and pains are pretty much resolved and she thinks was just from "old age ". She reports that she is having no significant myalgias or joint pains now.     Home Meds: See attached medication section for any medications that were entered at today's visit. The computer does not put those onto this list.The following list is a list of meds entered prior to today's visit.   Current Outpatient Prescriptions on File Prior to Visit  Medication Sig Dispense Refill  . albuterol (PROVENTIL HFA;VENTOLIN HFA) 108 (90 BASE) MCG/ACT inhaler Inhale 2 puffs into the lungs every 4 (four) hours as needed for wheezing.      Marland Kitchen aspirin EC 81 MG tablet Take 81 mg by mouth daily.        . fluticasone (FLONASE) 50 MCG/ACT nasal spray Place 2 sprays into both nostrils daily.  16 g  6  . mirabegron ER (MYRBETRIQ) 25 MG TB24 tablet Take 1 tablet (25 mg total) by mouth daily.  30 tablet  2  . Multiple  Vitamins-Minerals (MULTIVITAMINS THER. W/MINERALS) TABS Take 1 tablet by mouth daily.        . nitroGLYCERIN (NITROSTAT) 0.4 MG SL tablet Place 1 tablet (0.4 mg total) under the tongue every 5 (five) minutes as needed for chest pain.  25 tablet  3  . ofloxacin (OCUFLOX) 0.3 % ophthalmic solution 1 drop 4 (four) times daily.      . Omega-3 Fatty Acids (FISH OIL PO) Take 1 capsule by mouth 2 (two) times daily.       . prednisoLONE acetate (PRED MILD) 0.12 % ophthalmic suspension 1 drop 4 (four) times daily.      . ranitidine (ZANTAC) 75 MG tablet Take 75 mg by mouth daily.       No current facility-administered medications on file prior to visit.    Allergies:  Allergies  Allergen Reactions  . Codeine Nausea And Vomiting    Vicodin/Percocet Specified= excessive vomiting  . Sulfonamide Derivatives Hives  . Toviaz [Fesoterodine Fumarate Er]     Extremely dry mouth, felt hot like hotflashes. Felt dehydrated.      Review of Systems: See HPI for pertinent ROS. All other ROS negative.    Physical Exam: Blood pressure 132/80, pulse 76, temperature 98.2 F (36.8 C), temperature source Oral, resp. rate 18, weight 155 lb (70.308 kg),  last menstrual period 08/31/2010., Body mass index is 27.46 kg/(m^2). General:  WNWD WF. Appears in no acute distress. HEENT: Normocephalic, atraumatic, eyes without discharge, sclera non-icteric, nares are without discharge. Bilateral auditory canals clear, TM's are without perforation. Right TM has an area of golden in color down in the inferior portion. Left TM the inferior half is hazy with a light grey coloration. Oral cavity moist, posterior pharynx without exudate, erythema, peritonsillar abscess. Mild Sinus tenderness with percussion but she sounds severely congested with talking.  Neck: Supple. No thyromegaly. No lymphadenopathy. Lungs: Clear bilaterally to auscultation without wheezes, rales, or rhonchi. Breathing is unlabored. Heart: Regular rhythm. No  murmurs, rubs, or gallops. Msk:  Strength and tone normal for age. Extremities/Skin: Warm and dry. No clubbing or cyanosis. No edema. No rashes or suspicious lesions. Neuro: Alert and oriented X 3. Moves all extremities spontaneously. Gait is normal. CNII-XII grossly in tact. Psych:  Responds to questions appropriately with a normal affect.     ASSESSMENT AND PLAN:  52 y.o. year old female with  1. Bacterial respiratory infection - levofloxacin (LEVAQUIN) 750 MG tablet; Take 1 tablet (750 mg total) by mouth daily.  Dispense: 7 tablet; Refill: 0 - predniSONE (DELTASONE) 20 MG tablet; Take 3 daily for 2 days, then 2 daily for 2 days, then 1 daily for 2 days.  Dispense: 12 tablet; Refill: 0 If symptoms do not resolve at that the completion of these, then she needs to followup with me.  Signed, [address] [place], [place], [facility] 02/02/2013 11:02 AM

## 2013-02-04 ENCOUNTER — Telehealth: Payer: Self-pay | Admitting: *Deleted

## 2013-02-04 MED ORDER — NITROGLYCERIN 0.4 MG SL SUBL: 0.4000 mg | SUBLINGUAL_TABLET | SUBLINGUAL | Status: DC | PRN

## 2013-02-04 NOTE — Telephone Encounter (Signed)
Pt calling stating CP and lower back pain for last couple hours and lower back pain. Transferred call to [place].

## 2013-02-04 NOTE — Telephone Encounter (Signed)
Pt called c/o sharp quick chest pains that last for a few seconds with lower back pain and nausea. Pt states that it has been going on for two days. Pt went to [facility] on 02/02/13 and was put on Levaquin for upper resp. Infection. Pt states that she does have indigestion. Pt has not taken Nitro due to not picking up refills. Pt states that she does have some swelling in left leg but not bad. Looked at office note from MD at [facility] and it states that if pt continues with symptoms that she would need to call office. Advised pt to call their office today. FYI: pt states she has bad nerves. BP at office visit was 132/80 with pulse 76.  Please advise.

## 2013-02-04 NOTE — Telephone Encounter (Signed)
Called pt and made her aware.  

## 2013-02-04 NOTE — Telephone Encounter (Signed)
Noted. Agree with general plan for now. Description of symptoms is atypical for ischemic chest pain (quick, sharp, lasting a few seconds).

## 2013-02-25 ENCOUNTER — Encounter (HOSPITAL_COMMUNITY): Payer: Self-pay | Admitting: Ophthalmology

## 2013-04-04 ENCOUNTER — Emergency Department (HOSPITAL_COMMUNITY)
Admission: EM | Admit: 2013-04-04 | Discharge: 2013-04-05 | Disposition: A | Discharge status: Home / Self Care | Payer: Medicaid Other | Attending: Emergency Medicine | Admitting: Emergency Medicine

## 2013-04-04 ENCOUNTER — Encounter (HOSPITAL_COMMUNITY): Payer: Self-pay | Admitting: Emergency Medicine

## 2013-04-04 ENCOUNTER — Emergency Department (HOSPITAL_COMMUNITY): Payer: Medicaid Other

## 2013-04-04 DIAGNOSIS — Z872 Personal history of diseases of the skin and subcutaneous tissue: Secondary | ICD-10-CM | POA: Insufficient documentation

## 2013-04-04 DIAGNOSIS — I2510 Atherosclerotic heart disease of native coronary artery without angina pectoris: Secondary | ICD-10-CM | POA: Insufficient documentation

## 2013-04-04 DIAGNOSIS — Z9861 Coronary angioplasty status: Secondary | ICD-10-CM | POA: Insufficient documentation

## 2013-04-04 DIAGNOSIS — Z8659 Personal history of other mental and behavioral disorders: Secondary | ICD-10-CM | POA: Insufficient documentation

## 2013-04-04 DIAGNOSIS — Z7982 Long term (current) use of aspirin: Secondary | ICD-10-CM | POA: Insufficient documentation

## 2013-04-04 DIAGNOSIS — J449 Chronic obstructive pulmonary disease, unspecified: Secondary | ICD-10-CM | POA: Insufficient documentation

## 2013-04-04 DIAGNOSIS — I10 Essential (primary) hypertension: Secondary | ICD-10-CM | POA: Insufficient documentation

## 2013-04-04 DIAGNOSIS — I252 Old myocardial infarction: Secondary | ICD-10-CM | POA: Insufficient documentation

## 2013-04-04 DIAGNOSIS — J4489 Other specified chronic obstructive pulmonary disease: Secondary | ICD-10-CM | POA: Insufficient documentation

## 2013-04-04 DIAGNOSIS — F17200 Nicotine dependence, unspecified, uncomplicated: Secondary | ICD-10-CM | POA: Insufficient documentation

## 2013-04-04 DIAGNOSIS — R079 Chest pain, unspecified: Secondary | ICD-10-CM

## 2013-04-04 DIAGNOSIS — R071 Chest pain on breathing: Secondary | ICD-10-CM | POA: Insufficient documentation

## 2013-04-04 DIAGNOSIS — Z79899 Other long term (current) drug therapy: Secondary | ICD-10-CM | POA: Insufficient documentation

## 2013-04-04 DIAGNOSIS — Z792 Long term (current) use of antibiotics: Secondary | ICD-10-CM | POA: Insufficient documentation

## 2013-04-04 DIAGNOSIS — Z87448 Personal history of other diseases of urinary system: Secondary | ICD-10-CM | POA: Insufficient documentation

## 2013-04-04 LAB — COMPREHENSIVE METABOLIC PANEL
ALT: 15 U/L (ref 0–35)
AST: 16 U/L (ref 0–37)
Albumin: 3.6 g/dL (ref 3.5–5.2)
Alkaline Phosphatase: 74 U/L (ref 39–117)
BUN: 9 mg/dL (ref 6–23)
CO2: 25 meq/L (ref 19–32)
CREATININE: 0.77 mg/dL (ref 0.50–1.10)
Calcium: 9.5 mg/dL (ref 8.4–10.5)
Chloride: 103 mEq/L (ref 96–112)
GFR calc non Af Amer: >90 mL/min (ref >90)
GLUCOSE: 103 mg/dL — AB (ref 70–99)
Potassium: 3.7 mEq/L (ref 3.7–5.3)
Sodium: 143 mEq/L (ref 137–147)
Total Bilirubin: <0.2 mg/dL — ABNORMAL LOW (ref 0.3–1.2)
Total Protein: 6.7 g/dL (ref 6.0–8.3)

## 2013-04-04 LAB — CBC
HCT: 41 % (ref 36.0–46.0)
HEMOGLOBIN: 14.6 g/dL (ref 12.0–15.0)
MCH: 34 pg (ref 26.0–34.0)
MCHC: 35.6 g/dL (ref 30.0–36.0)
MCV: 95.6 fL (ref 78.0–100.0)
Platelets: 268 10*3/uL (ref 150–400)
RBC: 4.29 MIL/uL (ref 3.87–5.11)
RDW: 12.8 % (ref 11.5–15.5)
WBC: 6.5 10*3/uL (ref 4.0–10.5)

## 2013-04-04 LAB — I-STAT TROPONIN, ED: Troponin i, poc: 0 ng/mL (ref 0.00–0.08)

## 2013-04-04 MED ORDER — MORPHINE SULFATE 4 MG/ML IJ SOLN
4.0000 mg | Freq: Once | INTRAMUSCULAR | Status: AC
  Administered 2013-04-04: 4 mg via INTRAVENOUS
  Filled 2013-04-04: qty 1

## 2013-04-04 MED ORDER — SODIUM CHLORIDE 0.9 % IV SOLN: 1000.0000 mL | INTRAVENOUS | Status: DC

## 2013-04-04 MED ORDER — ASPIRIN 81 MG PO CHEW: 324.0000 mg | CHEWABLE_TABLET | Freq: Once | ORAL | Status: DC

## 2013-04-04 NOTE — ED Notes (Signed)
Patient transported to X-ray 

## 2013-04-04 NOTE — ED Provider Notes (Signed)
CSN: [id number]     Arrival date & time 04/04/13  2152 History   First MD Initiated Contact with Patient 04/04/13 2202     Chief Complaint  Patient presents with  . Chest Pain     HPI Comments: Pt has history of CAD.  She had an MI in the past.  This pain does not feel like that.  Patient is a 53 y.o. female presenting with chest pain. The history is provided by the patient.  Chest Pain Pain location:  Substernal area Pain quality: aching and tightness   Pain radiates to:  Does not radiate Pain severity:  Severe Onset quality:  Sudden Duration:  2 hours Timing:  Constant Worsened by:  Certain positions and deep breathing Associated symptoms: no fever, no nausea and no shortness of breath   Risk factors: coronary artery disease and smoking   Risk factors: no prior DVT/PE     Past Medical History  Diagnosis Date  . Coronary atherosclerosis of native coronary artery     DES LAD and PTCA diagonal 8/10, LVEF 30% 8/10  . Myocardial infarct     AMI 8/10  . Essential hypertension, benign   . Urinary incontinence   . Bipolar disorder   . COPD (chronic obstructive pulmonary disease)   . Eczema   . Stress incontinence    Past Surgical History  Procedure Laterality Date  . Appendectomy    . Tubal ligation    . Coronary stent placement      heart stent  . Cataract extraction w/phaco Left 01/26/2013    Procedure: LEFT EYE CATARACT EXTRACTION PHACO AND INTRAOCULAR LENS PLACEMENT  CDE=6.68;  Surgeon: Tonny Branch, MD;  Location: [facility];  Service: Ophthalmology;  Laterality: Left;  Marland Kitchen Eye surgery Bilateral 01/2013    cataracts  . Cataract extraction w/phaco Right 01/08/2013    Procedure: CATARACT EXTRACTION PHACO AND INTRAOCULAR LENS PLACEMENT (IOC);  Surgeon: Tonny Branch, MD;  Location: [facility];  Service: Ophthalmology;  Laterality: Right;  CDE 9.04   Family History  Problem Relation Age of Onset  . Coronary artery disease    . Hypertension    . Cancer Father     lung   History   Substance Use Topics  . Smoking status: Current Some Day Smoker -- 0.25 packs/day for 30 years    Types: Cigarettes  . Smokeless tobacco: Former Systems developer    Quit date: 05/23/2012  . Alcohol Use: No   OB History   Grav Para Term Preterm Abortions TAB SAB Ect Mult Living                 Review of Systems  Constitutional: Negative for fever.  Respiratory: Negative for shortness of breath.   Cardiovascular: Positive for chest pain.  Gastrointestinal: Negative for nausea.  All other systems reviewed and are negative.      Allergies  Codeine; Sulfonamide derivatives; and [place] Medications   Current Outpatient Rx  Name  Route  Sig  Dispense  Refill  . albuterol (PROVENTIL HFA;VENTOLIN HFA) 108 (90 BASE) MCG/ACT inhaler   Inhalation   Inhale 2 puffs into the lungs every 4 (four) hours as needed for wheezing.         Marland Kitchen aspirin EC 81 MG tablet   Oral   Take 81 mg by mouth daily.           . nitroGLYCERIN (NITROSTAT) 0.4 MG SL tablet   Sublingual   Place 1 tablet (0.4 mg total)  under the tongue every 5 (five) minutes as needed for chest pain.   25 tablet   3   . Omega-3 Fatty Acids (FISH OIL PO)   Oral   Take 1 capsule by mouth 2 (two) times daily.          . ranitidine (ZANTAC) 75 MG tablet   Oral   Take 75 mg by mouth daily.         Marland Kitchen levofloxacin (LEVAQUIN) 750 MG tablet   Oral   Take 1 tablet (750 mg total) by mouth daily.   7 tablet   0    BP 134/79  Pulse 84  Temp(Src) 98 F (36.7 C) (Oral)  Resp 22  Ht 5\' 3"  (1.6 m)  Wt 158 lb (71.668 kg)  BMI 28.00 kg/m2  SpO2 99%  LMP 08/31/2010 Physical Exam  Nursing note and vitals reviewed. Constitutional: She appears well-developed and well-nourished. No distress.  HENT:  Head: Normocephalic and atraumatic.  Right Ear: External ear normal.  Left Ear: External ear normal.  Eyes: Conjunctivae are normal. Right eye exhibits no discharge. Left eye exhibits no discharge. No scleral icterus.  Neck:  Neck supple. No tracheal deviation present.  Cardiovascular: Normal rate, regular rhythm and intact distal pulses.   Pulmonary/Chest: Effort normal and breath sounds normal. No stridor. No respiratory distress. She has no wheezes. She has no rales. She exhibits tenderness.  ttp chest wall, reproduces pain  Abdominal: Soft. Bowel sounds are normal. She exhibits no distension. There is no tenderness. There is no rebound and no guarding.  Musculoskeletal: She exhibits no edema and no tenderness.  Neurological: She is alert. She has normal strength. No cranial nerve deficit (no facial droop, extraocular movements intact, no slurred speech) or sensory deficit. She exhibits normal muscle tone. She displays no seizure activity. Coordination normal.  Skin: Skin is warm and dry. No rash noted.  Psychiatric: She has a normal mood and affect.    ED Course  Procedures (including critical care time) Labs Review Labs Reviewed  COMPREHENSIVE METABOLIC PANEL - Abnormal; Notable for the following:    Glucose, Bld 103 (*)    Total Bilirubin <0.2 (*)    All other components within normal limits  CBC  I-STAT TROPOININ, ED   Imaging Review Dg Chest 2 View  04/04/2013   CLINICAL DATA:  Acute onset right chest pain well resting.  EXAM: CHEST  2 VIEW  COMPARISON:  DG CHEST 2 VIEW dated 11/16/2012  FINDINGS: Cardiomediastinal silhouette is unremarkable and unchanged. Very mild chronic interstitial changes with increased lung volumes, mild flattening of the hemidiaphragms is similar. No pleural effusions or focal consolidations. No pneumothorax.  Mild degenerative change of thoracic spine. Uncovertebral hypertrophy partially imaged.  IMPRESSION: COPD without superimposed acute cardiopulmonary process.   Electronically Signed   By: Elon Alas   On: 04/04/2013 23:09     EKG Interpretation   Date/Time:  Saturday April 04 2013 21:53:30 EST Ventricular Rate:  82 PR Interval:  175 QRS Duration: 87 QT  Interval:  377 QTC Calculation: 440 R Axis:   62 Text Interpretation:  Sinus rhythm No significant change since last  tracing Confirmed by Kasaundra Fahrney  MD-J, Khoi Hamberger [id number]) on 04/04/2013 9:57:14 PM      MDM   Pt feels that her pain is different than her anginal pain in the past.  She does have ttp on her chest wall and the symptoms are atypical for cardiac disease.  Will monitor in the ED for serial  troponins.  Case turned over to oncoming MD.    Kathalene Frames, MD 04/04/13 2318

## 2013-04-04 NOTE — ED Notes (Addendum)
Sitting down watching TV, sudden onset of central chest pain that increased with movement.  Denies any other sx. Has hx MI.  Took 324mg  aspirin po prior to ambulance arrival.

## 2013-04-05 LAB — I-STAT TROPONIN, ED: TROPONIN I, POC: 0.01 ng/mL (ref 0.00–0.08)

## 2013-04-05 MED ORDER — ONDANSETRON HCL 8 MG PO TABS: 8.0000 mg | ORAL_TABLET | Freq: Three times a day (TID) | ORAL | Status: DC | PRN

## 2013-04-05 MED ORDER — HYDROCODONE-ACETAMINOPHEN 5-325 MG PO TABS: 1.0000 | ORAL_TABLET | Freq: Four times a day (QID) | ORAL | Status: DC | PRN

## 2013-04-05 NOTE — Discharge Instructions (Signed)

## 2013-04-05 NOTE — ED Provider Notes (Signed)
I assumed care at signout to f/u on labs Repeat ekg/troponin unremarkable  EKG Interpretation  Date/Time:  Sunday April 05 2013 01:45:32 EST Ventricular Rate:  78 PR Interval:  182 QRS Duration: 85 QT Interval:  378 QTC Calculation: 430 R Axis:   66 Text Interpretation:  Sinus rhythm Anteroseptal infarct, old Confirmed by Christy Gentles  MD, Naw Lasala ([id number]) on 04/05/2013 1:48:35 AM      Pt feels improved though clearly has pain with movement in bed, right sided only, and appears musculoskeletal in nature I doubt ACS/PE/Dissection at this time She requests zofran/vicodin for pain We discussed strict return precautions  BP 119/70  Pulse 77  Temp(Src) 98 F (36.7 C) (Oral)  Resp 18  Ht 5\' 3"  (1.6 m)  Wt 158 lb (71.668 kg)  BMI 28.00 kg/m2  SpO2 95%  LMP 08/31/2010   Sharyon Cable, MD 04/05/13 [phone]

## 2013-05-12 ENCOUNTER — Ambulatory Visit (INDEPENDENT_AMBULATORY_CARE_PROVIDER_SITE_OTHER): Payer: Medicaid Other | Admitting: Family Medicine

## 2013-05-12 ENCOUNTER — Encounter: Payer: Self-pay | Admitting: Family Medicine

## 2013-05-12 VITALS — BP 124/72 | HR 88 | Temp 98.1°F | Resp 16 | Ht 62.0 in | Wt 154.0 lb

## 2013-05-12 DIAGNOSIS — F17200 Nicotine dependence, unspecified, uncomplicated: Secondary | ICD-10-CM

## 2013-05-12 DIAGNOSIS — J441 Chronic obstructive pulmonary disease with (acute) exacerbation: Secondary | ICD-10-CM | POA: Insufficient documentation

## 2013-05-12 DIAGNOSIS — I2510 Atherosclerotic heart disease of native coronary artery without angina pectoris: Secondary | ICD-10-CM

## 2013-05-12 DIAGNOSIS — N393 Stress incontinence (female) (male): Secondary | ICD-10-CM

## 2013-05-12 MED ORDER — AZITHROMYCIN 250 MG PO TABS: ORAL_TABLET | ORAL | Status: DC

## 2013-05-12 MED ORDER — VARENICLINE TARTRATE 0.5 MG X 11 & 1 MG X 42 PO MISC: ORAL | Status: DC

## 2013-05-12 MED ORDER — PREDNISONE 10 MG PO TABS: ORAL_TABLET | ORAL | Status: DC

## 2013-05-12 NOTE — Progress Notes (Signed)
Patient ID: Melissa Garcia, female   DOB: November 13, 1960, 53 y.o.   MRN: [id number]   Subjective:    Patient ID: Melissa Garcia, female    DOB: [birth year], 53 y.o.   MRN: [id number]  Patient presents for illness  patient here with cough with production of thick green sputum wheezing as well as sinus pressure and drainage. She initially thought was her allergies therefore she's been taking Zyrtec-D which helps some but not her breathing. She has been using her albuterol a bit more than usual and she does continue to smoke. Her chart notes a history of COPD and emphysema.  She also like to be referred to a new cardiologist would not give any specific reasoning but wants to see Dr. Gwenlyn Found in [place] she has history of coronary artery disease.  She's also requesting a referral to GYN she is a history of stress incontinence she currently uses depends she will leak with the smallest amount of cough or sneezing and is worsening. She states that she is asked for referral in the past number office but was declined I do not see any evidence of this.  Like to try prescription for Chantix again. She states that when she was on a bladder medication the past she was also Chantix and was having some chest discomfort they thought it may be due to she takes it was stopped but it persisted she was then taken off the bladder medication and she still has the symptoms as well which is been over a year.    Review Of Systems:  GEN- denies fatigue, fever, weight loss,weakness, recent illness HEENT- denies eye drainage, change in vision,+ nasal discharge, CVS- denies chest pain, palpitations RESP- denies SOB, +cough, +wheeze ABD- denies N/V, change in stools, abd pain GU- denies dysuria, hematuria, dribbling, +incontinence Neuro- denies headache, dizziness, syncope, seizure activity       Objective:    BP 124/72  Pulse 88  Temp(Src) 98.1 F (36.7 C) (Oral)  Resp 16  Ht 5\' 2"  (1.575 m)  Wt 154 lb (69.854 kg)   BMI 28.16 kg/m2  LMP 08/31/2010 GEN- NAD, alert and oriented x3 HEENT- PERRL, EOMI, non injected sclera, pink conjunctiva, MMM, oropharynx clear, TM clear bilat , nares +rhinorrhea Neck- Supple, no LAD CVS- RRR, no murmur RESP-scattered wheeze, mild rhonchi, normal WOB, no retractions, harsh cough EXT- No edema Pulses- Radial 2+        Assessment & Plan:      Problem List Items Addressed This Visit   TOBACCO ABUSE     Patient wanted to try Chantix again I will send this and her pharmacy    Relevant Medications      varenicline (CHANTIX STARTING MONTH PAK) 0.5 MG X 11 & 1 MG X 42 tablet   Stress incontinence     Referral to GYN. She was given a prescription for the depends    Relevant Orders      Ambulatory referral to Gynecology   COPD exacerbation - Primary     COPD and emphysema noted in her chart. I will place her on prednisone taper also given her azithromycin Pak as she tolerates this better. She will continue albuterol as needed. She's completed her course of medications for her current exacerbation she'll start the Symbicort inhaler. I've also given her some Mucinex from the office.    Relevant Medications      budesonide-formoterol (SYMBICORT) 160-4.5 MCG/ACT inhaler      predniSONE (DELTASONE) tablet  azithromycin [facility]) tablet   CAD (coronary artery disease), native coronary artery     Known coronary artery disease she requests a new cardiologist in [place] I did advise her that they are all part of the same cardiology group she was to like to change to [place].    Relevant Orders      Ambulatory referral to Cardiology      Note: This dictation was prepared with Dragon dictation along with smaller phrase technology. Any transcriptional errors that result from this process are unintentional.

## 2013-05-12 NOTE — Assessment & Plan Note (Addendum)
Patient wanted to try Chantix again I will send this and her pharmacy. It does not sound like a typical allergy her interaction with the Chantix

## 2013-05-12 NOTE — Assessment & Plan Note (Addendum)
Known coronary artery disease she requests a new cardiologist in [place] I did advise her that they are all part of the same cardiology group she was to like to change to [place].

## 2013-05-12 NOTE — Patient Instructions (Signed)
Take antibiotics, use mucinex, and prednisone Start the symbicort after you complete the prednisone Referral to new cardiologist Referral to GYN for your bladder F/U 2 months for medications

## 2013-05-12 NOTE — Assessment & Plan Note (Signed)
Referral to GYN. She was given a prescription for the depends

## 2013-05-12 NOTE — Assessment & Plan Note (Signed)
COPD and emphysema noted in her chart. I will place her on prednisone taper also given her azithromycin Pak as she tolerates this better. She will continue albuterol as needed. She's completed her course of medications for her current exacerbation she'll start the Symbicort inhaler. I've also given her some Mucinex from the office.

## 2013-05-13 ENCOUNTER — Telehealth: Payer: Self-pay | Admitting: Family Medicine

## 2013-05-13 NOTE — Telephone Encounter (Signed)
Nurse calling after home visit.  Did a depression scale and total number was high at 17.  FYI-  Nurse to do Mental Health referral.  Wanted to check to see if patient was getting referrals she discussed with nurse from [place] yesterday.  I told her GYN and Cardiology referrals have been initiated.  Asked her if she was wanting provider to treat depression??  She said NO.  Just wanted to know if that had been discussed, and again she is doing referral.  Did tell  nurse has 2 mth F/U appt for June.

## 2013-05-13 NOTE — Telephone Encounter (Signed)
Pt seen for work in visit for acute problems only, you can schedule her with Melissa Garcia to discuss the depression

## 2013-05-14 NOTE — Telephone Encounter (Signed)
I agree. I can see her and discuss symptoms with her further to determine if this can be managed here vs requiring referral to psych.

## 2013-05-21 ENCOUNTER — Encounter: Payer: Self-pay | Admitting: *Deleted

## 2013-05-22 NOTE — Telephone Encounter (Signed)
Do not have valid phone number for patient

## 2013-05-25 ENCOUNTER — Encounter: Payer: Self-pay | Admitting: *Deleted

## 2013-05-25 ENCOUNTER — Encounter: Payer: Medicaid Other | Admitting: Cardiology

## 2013-05-25 ENCOUNTER — Encounter: Payer: Self-pay | Admitting: Cardiology

## 2013-05-25 NOTE — Progress Notes (Signed)
NNo-show. This encounter was created in error - please disregard. 

## 2013-05-26 ENCOUNTER — Encounter: Payer: Medicaid Other | Admitting: Adult Health

## 2013-05-27 ENCOUNTER — Encounter: Payer: Medicaid Other | Admitting: Obstetrics & Gynecology

## 2013-05-29 ENCOUNTER — Telehealth: Payer: Self-pay | Admitting: Family Medicine

## 2013-05-29 NOTE — Telephone Encounter (Signed)
Crisis management center called about patient.  She is having problems with her Bipolar condition.  She is seeing Mental health provider today but are asking for an appointment with her provider here for medications.  Appt given for Monday.  Told Crisis staff anything the [place] provider could send here for our review would be helpful.

## 2013-06-01 ENCOUNTER — Ambulatory Visit (INDEPENDENT_AMBULATORY_CARE_PROVIDER_SITE_OTHER): Payer: Medicaid Other | Admitting: Physician Assistant

## 2013-06-01 ENCOUNTER — Encounter: Payer: Self-pay | Admitting: Physician Assistant

## 2013-06-01 VITALS — BP 142/80 | HR 80 | Temp 98.6°F | Resp 18 | Wt 154.0 lb

## 2013-06-01 DIAGNOSIS — F3130 Bipolar disorder, current episode depressed, mild or moderate severity, unspecified: Secondary | ICD-10-CM

## 2013-06-01 MED ORDER — DIVALPROEX SODIUM ER 500 MG PO TB24: ORAL_TABLET | ORAL | Status: DC

## 2013-06-01 MED ORDER — SERTRALINE HCL 50 MG PO TABS: 50.0000 mg | ORAL_TABLET | Freq: Every day | ORAL | Status: DC

## 2013-06-01 NOTE — Progress Notes (Signed)
Patient ID: KERRIA SAPIEN MRN: [id number], DOB: March 25, 1960, 53 y.o. Date of Encounter: @DATE @  Chief Complaint:  Chief Complaint  Patient presents with  . issues with depression    problem with mental health provider    HPI: 53 y.o. year old white female  Presents with complaints of the above.  She reports that she has been evaluated and managed by psychiatry in the past.  States that in the past she was diagnosed with " bipolar   -- manic/ depressive ." Says that she had been [facility] at [facility] She had been on medication for a long time. Says that she was last on psych medications about 5 years ago. Says that what happened was-- she went in the hospital secondary to an MI.-- Subsequently her psychiatric medications ran out.  When she went back to psychiatry her prior psychiatrist "was gone". Says that the new doctor she saw said that she did not need medication.  Therefore no medications were prescribed/refilled at that time and she has stayed off of medicine since then.  Says that she has felt depressed over the past one month. Says that it got worse over the past one week. Says now over the past week she's been having episodes of crying " for no reason." Says that nothing is going on in her life to cause her to be depressed. She lives with her boyfriend. She does not work. Says that things are fine with the relationship with her boyfriend. Says that there are no issues to cause this change in mood.  I then discussed with her the difference in depression versus bipolar. As tolerated she has had episodes of feeling manic. She says that yes just one month ago she was "hyper " -- says that just one month ago she was constantly wanting to do one thing after the other and was only needing to sleep for about 2 hours and was constantly active. Says that she was also talking nonstop.   She cannot recall which medications she was on in the past as far as which ones worked for her and  which ones did not. She thinks that Zoloft may have been one of the last medicines that she had been on which had worked for her.  She was evaluated by " [facility]" on 05/29/13. THey have sent her records and they are the ones that scheduled  followup appointment with [place]. Her records indicate that patient was having some suicidal ideation but had no suicidal plan and no accessory remains to going through with this. She had no previous attempts/gestures. She had no recent stressful life event. She was noted to have positive social support. She was noted to have the following symptoms of depression including guilt, fatigue, feeling angry/irritable, tearfulness, feeling worthless/self-pity. Other symptoms are negative.  Today patient states she is no longer having suicidal ideation. Never had any suicidal plan. Never had homicidal ideation at all.   Past Medical History  Diagnosis Date  . Coronary atherosclerosis of native coronary artery     DES LAD and PTCA diagonal 8/10, LVEF 30% 8/10  . Myocardial infarct     AMI 8/10  . Essential hypertension, benign   . Urinary incontinence   . Bipolar disorder   . COPD (chronic obstructive pulmonary disease)   . Eczema   . Stress incontinence      Home Meds: See attached medication section for current medication list. Any medications entered into computer today will not appear on  this note's list. The medications listed below were entered prior to today. Current Outpatient Prescriptions on File Prior to Visit  Medication Sig Dispense Refill  . albuterol (PROVENTIL HFA;VENTOLIN HFA) 108 (90 BASE) MCG/ACT inhaler Inhale 2 puffs into the lungs every 4 (four) hours as needed for wheezing.      Marland Kitchen aspirin EC 81 MG tablet Take 81 mg by mouth daily.        . budesonide-formoterol (SYMBICORT) 160-4.5 MCG/ACT inhaler Inhale 2 puffs into the lungs 2 (two) times daily.      . nitroGLYCERIN (NITROSTAT) 0.4 MG SL tablet Place 1 tablet (0.4 mg  total) under the tongue every 5 (five) minutes as needed for chest pain.  25 tablet  3  . Omega-3 Fatty Acids (FISH OIL PO) Take 1 capsule by mouth 2 (two) times daily.       . ranitidine (ZANTAC) 75 MG tablet Take 75 mg by mouth daily.       No current facility-administered medications on file prior to visit.    Allergies:  Allergies  Allergen Reactions  . Codeine Nausea And Vomiting    Vicodin/Percocet Specified= excessive vomiting  . Sulfonamide Derivatives Hives  . Toviaz [Fesoterodine Fumarate Er]     Extremely dry mouth, felt hot like hotflashes. Felt dehydrated.    History   Social History  . Marital Status: Single    Spouse Name: N/A    Number of Children: N/A  . Years of Education: N/A   Occupational History  . Disabled    Social History Main Topics  . Smoking status: Current Some Day Smoker -- 0.25 packs/day for 30 years    Types: Cigarettes  . Smokeless tobacco: Former Systems developer    Quit date: 05/23/2012  . Alcohol Use: No  . Drug Use: No  . Sexual Activity: Yes    Birth Control/ Protection: None   Other Topics Concern  . Not on file   Social History Narrative  . No narrative on file    Family History  Problem Relation Age of Onset  . Coronary artery disease    . Hypertension    . Cancer Father     lung     Review of Systems:  See HPI for pertinent ROS. All other ROS negative.    Physical Exam: Blood pressure 142/80, pulse 80, temperature 98.6 F (37 C), temperature source Oral, resp. rate 18, weight 154 lb (69.854 kg), last menstrual period 08/31/2010., Body mass index is 28.16 kg/(m^2). General: Over weight WF. Tears in her eyes throughout visit. O/W appropriate. Appears in no acute distress. Neck: Supple. No thyromegaly. No lymphadenopathy. Lungs: Clear bilaterally to auscultation without wheezes, rales, or rhonchi. Breathing is unlabored. Heart: RRR with S1 S2. No murmurs, rubs, or gallops. Musculoskeletal:  Strength and tone normal for  age. Extremities/Skin: Warm and dry.  Neuro: Alert and oriented X 3. Moves all extremities spontaneously. Gait is normal. CNII-XII grossly in tact. Psych:  Responds to questions appropriately.  Throughout the visit tears are coming from her eyes and she is using tissues to wipe them.  Otherwise she is actually very appropriate.  Says that she hates feeling this way. Says she wants to be able to control this but she can't.     ASSESSMENT AND PLAN:  53 y.o. year old female with  1. BIPOLAR AFFECTIVE DISORDER, DEPRESSED - sertraline (ZOLOFT) 50 MG tablet; Take 1 tablet (50 mg total) by mouth daily.  Dispense: 30 tablet; Refill: 1 - divalproex (DEPAKOTE ER)  500 MG 24 hr tablet; Take 1 daily for 1 week then increase to 2 daily  Dispense: 60 tablet; Refill: 0  Discussed with patient that it will take weeks for these medications to be effective. Told her to followup in 2-3 weeks just for [place] to make sure that she is taking the medications as directed and followup. She is to followup sooner if she thinks she is having any adverse reactions to the medicines or if she is feeling worse in general. Otherwise we'll have her follow up here in 2-3 weeks and then again about 6 weeks from now at which time the medication should be effective and we can reassess.  Signed, [address] [place], [place], [facility] 06/01/2013 11:48 AM

## 2013-06-10 ENCOUNTER — Encounter: Payer: Self-pay | Admitting: *Deleted

## 2013-06-11 ENCOUNTER — Telehealth: Payer: Self-pay | Admitting: Family Medicine

## 2013-06-11 NOTE — Telephone Encounter (Signed)
Not sure of new meds and how she is feeling.  States a lot of sleepiness and not wanting to get up to do anything.  Has follow up appt on Monday.  Told her that this type medication can take up to one month to even out in system.  Continue to take, keep appt scheduled for Monday and discuss with provider then.

## 2013-06-15 ENCOUNTER — Encounter: Payer: Self-pay | Admitting: Physician Assistant

## 2013-06-15 ENCOUNTER — Ambulatory Visit (INDEPENDENT_AMBULATORY_CARE_PROVIDER_SITE_OTHER): Payer: Medicaid Other | Admitting: Physician Assistant

## 2013-06-15 VITALS — BP 132/76 | HR 68 | Temp 98.1°F | Resp 18 | Wt 154.0 lb

## 2013-06-15 DIAGNOSIS — F3130 Bipolar disorder, current episode depressed, mild or moderate severity, unspecified: Secondary | ICD-10-CM

## 2013-06-15 MED ORDER — ARIPIPRAZOLE 15 MG PO TABS: 15.0000 mg | ORAL_TABLET | Freq: Every day | ORAL | Status: DC

## 2013-06-15 NOTE — Progress Notes (Signed)
Patient ID: Melissa Garcia MRN: [id number], DOB: [birth year], 53 y.o. Date of Encounter: @DATE @  Chief Complaint:  Chief Complaint  Patient presents with  . reaction to depakote    severe mood swings, seemed like she was out of her mind    HPI: 53 y.o. year old white female  Presents with complaints of the above.  She just recently had an office visit with me about 2 weeks ago. The following section is copied from that note.:  She reports that she has been evaluated and managed by psychiatry in the past.  States that in the past she was diagnosed with " bipolar   -- manic/ depressive ." Says that she had been treated at [facility]. She had been on medication for a long time. Says that she was last on psych medications about 5 years ago. Says that what happened was-- she went in the hospital secondary to an MI.-- Subsequently her psychiatric medications ran out.  When she went back to psychiatry her prior psychiatrist "was gone". Says that the new doctor she saw said that she did not need medication.  Therefore no medications were prescribed/refilled at that time and she has stayed off of medicine since then.  Says that she has felt depressed over the past one month. Says that it got worse over the past one week. Says now over the past week she's been having episodes of crying " for no reason." Says that nothing is going on in her life to cause her to be depressed. She lives with her boyfriend. She does not work. Says that things are fine with the relationship with her boyfriend. Says that there are no issues to cause this change in mood.  I then discussed with her the difference in depression versus bipolar. She reports that she has had episodes of feeling manic. She says that  just one month ago she was "hyper " -- says that just one month ago she was constantly wanting to do one thing after the other and was only needing to sleep for about 2 hours and was constantly active. Says that  she was also talking nonstop.   She cannot recall which medications she was on in the past as far as which ones worked for her and which ones did not. She thinks that Zoloft may have been one of the last medicines that she had been on which had worked for her.  She was evaluated by " [facility]" on 05/29/13. THey have sent her records and they are the ones that scheduled  followup appointment with [place]. Her records indicate that patient was having some suicidal ideation but had no suicidal plan and no plans to going through with this. She had no previous attempts/gestures. She had no recent stressful life event. She was noted to have positive social support. She was noted to have the following symptoms of depression including guilt, fatigue, feeling angry/irritable, tearfulness, feeling worthless/self-pity. Other symptoms are negative.  Today patient states she is no longer having suicidal ideation. Never had any suicidal plan. Never had homicidal ideation at all.  AT THAT OV, I PRESCRIBED ZOLOFT 50MG  ONE PO QD AND ALSO PRESCRIBED DEPAKOTE.  She reports that she did start taking both of those medications as directed.. Says for the first week she was not crying anymore. However, she "was sitting in a stare." Was not able to get anything done. Since then, she started to notice that she was getting angry over very little ridiculous  things and was getting extremely angry-- which she had never done before. Says this occurred about one week ago--  at that time she stopped Depakote but continued the Zoloft. Says that since then she's continued to not cry. Says that she feels that she can focus better. Says that she does still feel very tired all the time. The "sitting in a stare' has resolved. Also the anger has resolved.    Past Medical History  Diagnosis Date  . Coronary atherosclerosis of native coronary artery     DES LAD and PTCA diagonal 8/10, LVEF 30% 8/10  . Myocardial infarct      AMI 8/10  . Essential hypertension, benign   . Urinary incontinence   . Bipolar disorder   . COPD (chronic obstructive pulmonary disease)   . Eczema   . Stress incontinence      Home Meds: See attached medication section for current medication list. Any medications entered into computer today will not appear on this note's list. The medications listed below were entered prior to today. Current Outpatient Prescriptions on File Prior to Visit  Medication Sig Dispense Refill  . albuterol (PROVENTIL HFA;VENTOLIN HFA) 108 (90 BASE) MCG/ACT inhaler Inhale 2 puffs into the lungs every 4 (four) hours as needed for wheezing.      Marland Kitchen aspirin EC 81 MG tablet Take 81 mg by mouth daily.        . budesonide-formoterol (SYMBICORT) 160-4.5 MCG/ACT inhaler Inhale 2 puffs into the lungs 2 (two) times daily.      . nitroGLYCERIN (NITROSTAT) 0.4 MG SL tablet Place 1 tablet (0.4 mg total) under the tongue every 5 (five) minutes as needed for chest pain.  25 tablet  3  . Omega-3 Fatty Acids (FISH OIL PO) Take 1 capsule by mouth 2 (two) times daily.       . ranitidine (ZANTAC) 75 MG tablet Take 75 mg by mouth daily.      . sertraline (ZOLOFT) 50 MG tablet Take 1 tablet (50 mg total) by mouth daily.  30 tablet  1  . divalproex (DEPAKOTE ER) 500 MG 24 hr tablet Take 1 daily for 1 week then increase to 2 daily  60 tablet  0   No current facility-administered medications on file prior to visit.    Allergies:  Allergies  Allergen Reactions  . Codeine Nausea And Vomiting    Vicodin/Percocet Specified= excessive vomiting  . Depakote [Divalproex Sodium]     "Mood Swings--Felt like out of my mind"  . Sulfonamide Derivatives Hives  . Toviaz [Fesoterodine Fumarate Er]     Extremely dry mouth, felt hot like hotflashes. Felt dehydrated.    History   Social History  . Marital Status: Single    Spouse Name: N/A    Number of Children: N/A  . Years of Education: N/A   Occupational History  . Disabled     Social History Main Topics  . Smoking status: Current Some Day Smoker -- 0.25 packs/day for 30 years    Types: Cigarettes  . Smokeless tobacco: Former Systems developer    Quit date: 05/23/2012  . Alcohol Use: No  . Drug Use: No  . Sexual Activity: Yes    Birth Control/ Protection: None   Other Topics Concern  . Not on file   Social History Narrative  . No narrative on file    Family History  Problem Relation Age of Onset  . Coronary artery disease    . Hypertension    . Cancer  Father     lung     Review of Systems:  See HPI for pertinent ROS. All other ROS negative.    Physical Exam: Blood pressure 132/76, pulse 68, temperature 98.1 F (36.7 C), temperature source Oral, resp. rate 18, weight 154 lb (69.854 kg), last menstrual period 08/31/2010., Body mass index is 28.16 kg/(m^2). General: Over weight WF. Tears in her eyes throughout visit. O/W appropriate. Appears in no acute distress. Neck: Supple. No thyromegaly. No lymphadenopathy. Lungs: Clear bilaterally to auscultation without wheezes, rales, or rhonchi. Breathing is unlabored. Heart: RRR with S1 S2. No murmurs, rubs, or gallops. Musculoskeletal:  Strength and tone normal for age. Extremities/Skin: Warm and dry.  Neuro: Alert and oriented X 3. Moves all extremities spontaneously. Gait is normal. CNII-XII grossly in tact. Psych:  Responds to questions appropriately. Not tearful today. Mood/affect appropriate during today's visit.      ASSESSMENT AND PLAN:  53 y.o. year old female with  1. BIPOLAR AFFECTIVE DISORDER, DEPRESSED  1. BIPOLAR AFFECTIVE DISORDER, DEPRESSED  Continue the Zoloft at current dose of 50 mg daily. Depakote has been discontinued and has been added to her "allergy list" Will add Abilify to replace the Depakote. - ARIPiprazole (ABILIFY) 15 MG tablet; Take 1 tablet (15 mg total) by mouth daily.  Dispense: 30 tablet; Refill: 1  At Her last office visit she had already scheduled a followup office  visit. She will keep that followup appointment. Follow up sooner if she is feeling any worse or thinks she is having any further adverse medication effects.   - sertraline (ZOLOFT) 50 MG tablet; Take 1 tablet (50 mg total) by mouth daily.  Dispense: 30 tablet; Refill: 1 - divalproex (DEPAKOTE ER) 500 MG 24 hr tablet; Take 1 daily for 1 week then increase to 2 daily  Dispense: 60 tablet; Refill: 0  Signed, [address] [place], [place], [facility] 06/15/2013 9:59 AM

## 2013-06-22 ENCOUNTER — Other Ambulatory Visit (HOSPITAL_COMMUNITY)
Admission: RE | Admit: 2013-06-22 | Discharge: 2013-06-22 | Disposition: A | Discharge status: Home / Self Care | Payer: Medicaid Other | Source: Ambulatory Visit | Attending: Obstetrics & Gynecology | Admitting: Obstetrics & Gynecology

## 2013-06-22 ENCOUNTER — Encounter: Payer: Self-pay | Admitting: Women's Health

## 2013-06-22 ENCOUNTER — Ambulatory Visit (INDEPENDENT_AMBULATORY_CARE_PROVIDER_SITE_OTHER): Payer: Medicaid Other | Admitting: Women's Health

## 2013-06-22 VITALS — BP 130/64 | Ht 63.25 in | Wt 152.8 lb

## 2013-06-22 DIAGNOSIS — Z1212 Encounter for screening for malignant neoplasm of rectum: Secondary | ICD-10-CM

## 2013-06-22 DIAGNOSIS — Z1211 Encounter for screening for malignant neoplasm of colon: Secondary | ICD-10-CM

## 2013-06-22 DIAGNOSIS — Z0000 Encounter for general adult medical examination without abnormal findings: Secondary | ICD-10-CM

## 2013-06-22 DIAGNOSIS — Z1151 Encounter for screening for human papillomavirus (HPV): Secondary | ICD-10-CM | POA: Insufficient documentation

## 2013-06-22 DIAGNOSIS — Z124 Encounter for screening for malignant neoplasm of cervix: Secondary | ICD-10-CM | POA: Insufficient documentation

## 2013-06-22 DIAGNOSIS — Z01419 Encounter for gynecological examination (general) (routine) without abnormal findings: Secondary | ICD-10-CM

## 2013-06-22 DIAGNOSIS — N950 Postmenopausal bleeding: Secondary | ICD-10-CM

## 2013-06-22 LAB — HEMOCCULT GUIAC POC 1CARD (OFFICE): FECAL OCCULT BLD: NEGATIVE

## 2013-06-22 NOTE — Progress Notes (Signed)
Patient ID: Melissa Garcia, female   DOB: 02-Dec-1960, 53 y.o.   MRN: [id number] Subjective:   Melissa Garcia is a 53 y.o.  female here for a routine well-woman exam.  Patient's last menstrual period was 08/31/2010.    Current complaints: leaks urine w/ coughing, sex, etc, sometimes just a little, sometimes a big gush. Has tried meds including myrbetriq is past- didn't help, had tried occ kegels.  Also wiped mucousy blood-streaked d/c ~1wk ago, no correlation w/ sexual intercourse. No malodorous d/c or vulvovaginal itching/irritatin. z PCP: Dr. Buelah Manis, [place]       PCP does yearly labs  Social History: Sexual: mutually monogamous heterosexual relationship Marital Status: co-habitating Living situation: with partner / significant other Occupation: unemployed, taking classes in applied behavioral sciences online through [employer]: 3-10 cigs/day, wants to quit- has already been referred to [facility], was on chantix- but [facility] will not refill, no etoh Illicit drugs: no history of illicit drug use  The following portions of the patient's history were reviewed and updated as appropriate: allergies, current medications, past family history, past medical history, past social history, past surgical history and problem list.  Past Medical History Past Medical History  Diagnosis Date  . Coronary atherosclerosis of native coronary artery     DES LAD and PTCA diagonal 8/10, LVEF 30% 8/10  . Myocardial infarct     AMI 8/10  . Essential hypertension, benign   . Urinary incontinence   . Bipolar disorder   . COPD (chronic obstructive pulmonary disease)   . Eczema   . Stress incontinence     Past Surgical History Past Surgical History  Procedure Laterality Date  . Appendectomy    . Tubal ligation    . Coronary stent placement      heart stent  . Cataract extraction w/phaco Left 01/26/2013    Procedure: LEFT EYE CATARACT EXTRACTION PHACO AND INTRAOCULAR LENS  PLACEMENT  CDE=6.68;  Surgeon: Tonny Branch, MD;  Location: [facility];  Service: Ophthalmology;  Laterality: Left;  Marland Kitchen Eye surgery Bilateral 01/2013    cataracts  . Cataract extraction w/phaco Right 01/08/2013    Procedure: CATARACT EXTRACTION PHACO AND INTRAOCULAR LENS PLACEMENT (IOC);  Surgeon: Tonny Branch, MD;  Location: [facility];  Service: Ophthalmology;  Laterality: Right;  CDE 9.04    Gynecologic History No obstetric history on file.  Patient's last menstrual period was 08/31/2010. Contraception: post menopausal status and tubal ligation Last Pap: 2013. Results were: normal Last mammogram: 08/2012. Results were: normal Last TCS: never  Obstetric History OB History  No data available    Current Medications Current Outpatient Prescriptions on File Prior to Visit  Medication Sig Dispense Refill  . albuterol (PROVENTIL HFA;VENTOLIN HFA) 108 (90 BASE) MCG/ACT inhaler Inhale 2 puffs into the lungs every 4 (four) hours as needed for wheezing.      Marland Kitchen aspirin EC 81 MG tablet Take 81 mg by mouth daily.        . budesonide-formoterol (SYMBICORT) 160-4.5 MCG/ACT inhaler Inhale 2 puffs into the lungs 2 (two) times daily.      . nitroGLYCERIN (NITROSTAT) 0.4 MG SL tablet Place 1 tablet (0.4 mg total) under the tongue every 5 (five) minutes as needed for chest pain.  25 tablet  3  . Omega-3 Fatty Acids (FISH OIL PO) Take 1 capsule by mouth 2 (two) times daily.       . ranitidine (ZANTAC) 75 MG tablet Take 75 mg by mouth daily.      Marland Kitchen  sertraline (ZOLOFT) 50 MG tablet Take 1 tablet (50 mg total) by mouth daily.  30 tablet  1  . ARIPiprazole (ABILIFY) 15 MG tablet Take 1 tablet (15 mg total) by mouth daily.  30 tablet  1   No current facility-administered medications on file prior to visit.    Review of Systems Patient denies any headaches, blurred vision, shortness of breath, chest pain, abdominal pain, problems with bowel movements or intercourse. + stress urinary incontinence  Objective:  BP  130/64  Ht 5' 3.25" (1.607 m)  Wt 152 lb 12.8 oz (69.31 kg)  BMI 26.84 kg/m2  LMP 08/31/2010 Physical Exam  General:  Well developed, well nourished, no acute distress. She is alert and oriented x3. Skin:  Warm and dry Neck:  Midline trachea, no thyromegaly or nodules Cardiovascular: Regular rate and rhythm, no murmur heard Lungs:  Effort normal, all lung fields clear to auscultation bilaterally Breasts:  No dominant palpable mass, retraction, or nipple discharge Abdomen:  Soft, non tender, no hepatosplenomegaly or masses Pelvic:  External genitalia is normal in appearance.  The vagina is normal in appearance. The cervix is bulbous, no CMT.  Thin prep pap is done w/ HR HPV cotesting. Uterus is felt to be normal size, shape, and contour.  No adnexal masses or tenderness noted. Rectal: Good sphincter tone, no polyps, or hemorrhoids felt.  Hemoccult: neg Extremities:  No swelling or varicosities noted Psych:  She has a normal mood and affect  Assessment:   Healthy well-woman exam Smoker Stress UI Postmenopausal bleeding Needs screening TCS H/O CHTN, MI, COPD, Bipolar disorder  Plan:  F/U asap for pelvic u/s d/t pm bleeding, or sooner if needed Mammogram- to schedule for July, or sooner if problems Colonoscopy- order for referral placed today, needs screening TCS Kegels 100x/day, if no improvement- schedule visit w/ one of our MDs Keep appt w/ cardiologist Dr. Gwenlyn Found & PCP Dr. Buelah Manis in June  [place], [facility] 06/22/2013 11:48 AM

## 2013-06-22 NOTE — Patient Instructions (Signed)
Kegel Exercises (10 reps 10 times a day= 100/day) The goal of Kegel exercises is to isolate and exercise your pelvic floor muscles. These muscles act as a hammock that supports the rectum, vagina, small intestine, and uterus. As the muscles weaken, the hammock sags and these organs are displaced from their normal positions. Kegel exercises can strengthen your pelvic floor muscles and help you to improve bladder and bowel control, improve sexual response, and help reduce many problems and some discomfort during pregnancy. Kegel exercises can be done anywhere and at any time. HOW TO PERFORM KEGEL EXERCISES 1. Locate your pelvic floor muscles. To do this, squeeze (contract) the muscles that you use when you try to stop the flow of urine. You will feel a tightness in the vaginal area (women) and a tight lift in the rectal area (men and women). 2. When you begin, contract your pelvic muscles tight for 2 5 seconds, then relax them for 2 5 seconds. This is one set. Do 4 5 sets with a short pause in between. 3. Contract your pelvic muscles for 8 10 seconds, then relax them for 8 10 seconds. Do 4 5 sets. If you cannot contract your pelvic muscles for 8 10 seconds, try 5 7 seconds and work your way up to 8 10 seconds. Your goal is 4 5 sets of 10 contractions each day. Keep your stomach, buttocks, and legs relaxed during the exercises. Perform sets of both short and long contractions. Vary your positions. Perform these contractions 3 4 times per day. Perform sets while you are:   Lying in bed in the morning.  Standing at lunch.  Sitting in the late afternoon.  Lying in bed at night. You should do 40 50 contractions per day. Do not perform more Kegel exercises per day than recommended. Overexercising can cause muscle fatigue. Continue these exercises for for at least 15 20 weeks or as directed by your caregiver. Document Released: 01/09/2012 Document Reviewed: 01/09/2012 [facility] Patient Information 2014  [place], [place].

## 2013-06-30 ENCOUNTER — Encounter: Payer: Self-pay | Admitting: Women's Health

## 2013-06-30 ENCOUNTER — Ambulatory Visit (INDEPENDENT_AMBULATORY_CARE_PROVIDER_SITE_OTHER): Payer: Medicaid Other | Admitting: Women's Health

## 2013-06-30 ENCOUNTER — Encounter: Payer: Self-pay | Admitting: Radiology

## 2013-06-30 ENCOUNTER — Ambulatory Visit (INDEPENDENT_AMBULATORY_CARE_PROVIDER_SITE_OTHER): Payer: Medicaid Other

## 2013-06-30 ENCOUNTER — Other Ambulatory Visit: Payer: Self-pay | Admitting: Women's Health

## 2013-06-30 VITALS — BP 130/70 | Ht 63.0 in | Wt 152.0 lb

## 2013-06-30 DIAGNOSIS — N950 Postmenopausal bleeding: Secondary | ICD-10-CM

## 2013-06-30 DIAGNOSIS — D259 Leiomyoma of uterus, unspecified: Secondary | ICD-10-CM

## 2013-06-30 DIAGNOSIS — R87610 Atypical squamous cells of undetermined significance on cytologic smear of cervix (ASC-US): Secondary | ICD-10-CM | POA: Insufficient documentation

## 2013-06-30 NOTE — Patient Instructions (Signed)
Endometrial Biopsy Endometrial biopsy is a procedure in which a tissue sample is taken from inside the uterus. The tissue sample is then looked at under a microscope to see if the tissue is normal or abnormal. The endometrium is the lining of the uterus. This procedure helps determine where you are in your menstrual cycle and how hormone levels are affecting the lining of the uterus. This procedure may also be used to evaluate uterine bleeding or to diagnose endometrial cancer, tuberculosis, polyps, or inflammatory conditions.  LET YOUR HEALTH CARE PROVIDER KNOW ABOUT:  Any allergies you have.  All medicines you are taking, including vitamins, herbs, eye drops, creams, and over-the-counter medicines.  Previous problems you or members of your family have had with the use of anesthetics.  Any blood disorders you have.  Previous surgeries you have had.  Medical conditions you have.  Possibility of pregnancy. RISKS AND COMPLICATIONS Generally, this is a safe procedure. However, as with any procedure, complications can occur. Possible complications include:  Bleeding.  Pelvic infection.  Puncture of the uterine wall with the biopsy device (rare). BEFORE THE PROCEDURE   Keep a record of your menstrual cycles as directed by your health care provider. You may need to schedule your procedure for a specific time in your cycle.  You may want to bring a sanitary pad to wear home after the procedure.  Arrange for someone to drive you home after the procedure if you will be given a medicine to help you relax (sedative). PROCEDURE   You may be given a sedative to relax you.  You will lie on an exam table with your feet and legs supported as in a pelvic exam.  Your health care provider will insert an instrument (speculum) into your vagina to see your cervix.  Your cervix will be cleansed with an antiseptic solution. A medicine (local anesthetic) will be used to numb the cervix.  A forceps  instrument (tenaculum) will be used to hold your cervix steady for the biopsy.  A thin, rodlike instrument (uterine sound) will be inserted through your cervix to determine the length of your uterus and the location where the biopsy sample will be removed.  A thin, flexible tube (catheter) will be inserted through your cervix and into the uterus. The catheter is used to collect the biopsy sample from your endometrial tissue.  The catheter and speculum will then be removed, and the tissue sample will be sent to a lab for examination. AFTER THE PROCEDURE  You will rest in a recovery area until you are ready to go home.  You may have mild cramping and a small amount of vaginal bleeding for a few days after the procedure. This is normal.  Make sure you find out how to get your test results. Document Released: 05/25/2004 Document Revised: 09/24/2012 Document Reviewed: 07/09/2012 [employer] Patient Information 2014 [employer].  

## 2013-06-30 NOTE — Progress Notes (Signed)
Patient ID: Melissa Garcia, female   DOB: 1960-12-02, 53 y.o.   MRN: [id number]   [facility] Visit  Patient name: [name] MRN [id number]  Date of birth: 11/25/1960  CC & HPI:  Melissa Garcia is a 53 y.o. Caucasian female presenting today for f/u after pelvic u/s today for postmenopausal bleeding. Confirms it has been >365d since last period. Wiped mucousy blood-streaked d/c few weeks ago. Has also been doing kegels as directed and SUI seems to be improving some. Got letter in mail to schedule colonoscopy. Notified her of ASCUS pap w/ -HRHPV.   Pertinent History Reviewed:  Medical & Surgical Hx:   Past Medical History  Diagnosis Date  . Coronary atherosclerosis of native coronary artery     DES LAD and PTCA diagonal 8/10, LVEF 30% 8/10  . Myocardial infarct     AMI 8/10  . Essential hypertension, benign   . Urinary incontinence   . Bipolar disorder   . COPD (chronic obstructive pulmonary disease)   . Eczema   . Stress incontinence    Past Surgical History  Procedure Laterality Date  . Appendectomy    . Tubal ligation    . Coronary stent placement      heart stent  . Cataract extraction w/phaco Left 01/26/2013    Procedure: LEFT EYE CATARACT EXTRACTION PHACO AND INTRAOCULAR LENS PLACEMENT  CDE=6.68;  Surgeon: Tonny Branch, MD;  Location: [facility];  Service: Ophthalmology;  Laterality: Left;  Marland Kitchen Eye surgery Bilateral 01/2013    cataracts  . Cataract extraction w/phaco Right 01/08/2013    Procedure: CATARACT EXTRACTION PHACO AND INTRAOCULAR LENS PLACEMENT (IOC);  Surgeon: Tonny Branch, MD;  Location: [facility];  Service: Ophthalmology;  Laterality: Right;  CDE 9.04   Medications: Reviewed & Updated - see associated section Social History: Reviewed -  reports that she has been smoking Cigarettes.  She has a 7.5 pack-year smoking history. She quit smokeless tobacco use about 13 months ago.  Objective Findings:  Vitals: BP 130/70  Ht 5\' 3"  (1.6 m)  Wt 152 lb (68.947 kg)   BMI 26.93 kg/m2  LMP 08/31/2010  Physical Examination: General appearance - alert, well appearing, and in no distress  Today's pelvic u/s:  Uterus 8.0 x 5.3 x 4.3 cm, multiple fibroids noted (=1.4, 1.3, & 1.2cm in size) within the fundus  Endometrium 14.7 mm, asymmetrical although no obvious mass noted within  Right ovary 1.7 x 1.7 x 1.1 cm,  Left ovary 3.0 x 2.2 x 1.3 cm,  No free fluid noted within the pelvis  Technician Comments:  Uterus noted with multiple fibroids noted within the fundus, Endom-14.57mm, bilateral adnexa appears WNL, no free fluid or adnexal masses noted within the pelvis  Alicia Amel  06/30/2013  1:28 PM  Assessment & Plan:  A:   Postmenopausal bleeding w/ thickened endometrium per today's u/s  Uterine fibroids  SUI, improving w/ kegels  ASCUS pap w/ -HRHPV P:   F/U asap for endometrial biopsy w/ MD  Continue kegels  Schedule TCS  Repeat pap/cotesting in 55yr per [facility]P guidelines   Tawnya Crook CNM, WHNP-BC 06/30/2013 2:06 PM

## 2013-07-08 ENCOUNTER — Encounter: Payer: Self-pay | Admitting: Obstetrics and Gynecology

## 2013-07-08 ENCOUNTER — Other Ambulatory Visit: Payer: Self-pay | Admitting: Obstetrics and Gynecology

## 2013-07-08 ENCOUNTER — Ambulatory Visit (INDEPENDENT_AMBULATORY_CARE_PROVIDER_SITE_OTHER): Payer: Medicaid Other | Admitting: Obstetrics and Gynecology

## 2013-07-08 VITALS — BP 98/60 | Ht 63.0 in | Wt 151.0 lb

## 2013-07-08 DIAGNOSIS — D259 Leiomyoma of uterus, unspecified: Secondary | ICD-10-CM

## 2013-07-08 DIAGNOSIS — N840 Polyp of corpus uteri: Secondary | ICD-10-CM

## 2013-07-08 DIAGNOSIS — N950 Postmenopausal bleeding: Secondary | ICD-10-CM | POA: Insufficient documentation

## 2013-07-08 DIAGNOSIS — D219 Benign neoplasm of connective and other soft tissue, unspecified: Secondary | ICD-10-CM | POA: Insufficient documentation

## 2013-07-08 NOTE — Patient Instructions (Signed)
Endometrial Biopsy, Care After Refer to this sheet in the next few weeks. These instructions provide you with information on caring for yourself after your procedure. Your health care provider may also give you more specific instructions. Your treatment has been planned according to current medical practices, but problems sometimes occur. Call your health care provider if you have any problems or questions after your procedure. WHAT TO EXPECT AFTER THE PROCEDURE After your procedure, it is typical to have the following:  You may have mild cramping and a small amount of vaginal bleeding for a few days after the procedure. This is normal. HOME CARE INSTRUCTIONS  Only take over-the-counter or prescription medicine as directed by your health care provider.  Do not douche, use tampons, or have sexual intercourse until your health care provider approves.  Follow your health care provider's instructions regarding any activity restrictions, such as strenuous exercise or heavy lifting. SEEK MEDICAL CARE IF:  You have heavy bleeding or bleeding longer than 2 days after the procedure.  You have bad smelling drainage from your vagina.  You have a fever and chills.  Youhave severe lower stomach (abdominal) pain. SEEK IMMEDIATE MEDICAL CARE IF:  You have severe cramps in your stomach or back.  You pass large blood clots.  Your bleeding increases.  You become weak or lightheaded, or you pass out. Document Released: 11/12/2012 Document Reviewed: 07/09/2012 [employer] Patient Information 2014 [employer].  

## 2013-07-08 NOTE — Progress Notes (Signed)
This chart was scribed by Ludger Nutting, Medical Scribe, for Dr. Mallory Shirk on 07/08/13 at 11:33 AM. This chart was reviewed by Dr. Mallory Shirk for accuracy.   Melissa Garcia is a 53 y.o. female presents today for an endometrial biopsy.   Endometrial Biopsy: Patient given informed consent, signed copy in the chart, time out was performed. Time out taken. The patient was placed in the lithotomy position and the cervix brought into view with sterile speculum.  Portion of cervix cleansed x 2 with betadine swabs.  A tenaculum was placed in the anterior lip of the cervix. The uterus was sounded for depth of 7 cm, in the retroverted position.  Milex uterine Explora 3 mm was introduced to into the uterus, suction created,  and an endometrial sample was obtained. All equipment was removed and accounted for.   The patient tolerated the procedure well.    Patient given post procedure instructions.  Followup: 1 week for results

## 2013-07-13 ENCOUNTER — Encounter: Payer: Self-pay | Admitting: Family Medicine

## 2013-07-13 ENCOUNTER — Ambulatory Visit: Payer: Medicaid Other | Admitting: Family Medicine

## 2013-07-13 ENCOUNTER — Ambulatory Visit (INDEPENDENT_AMBULATORY_CARE_PROVIDER_SITE_OTHER): Payer: Medicaid Other | Admitting: Family Medicine

## 2013-07-13 VITALS — BP 118/68 | HR 78 | Temp 98.4°F | Resp 16 | Ht 62.0 in | Wt 154.0 lb

## 2013-07-13 DIAGNOSIS — Z23 Encounter for immunization: Secondary | ICD-10-CM

## 2013-07-13 DIAGNOSIS — L259 Unspecified contact dermatitis, unspecified cause: Secondary | ICD-10-CM

## 2013-07-13 DIAGNOSIS — L309 Dermatitis, unspecified: Secondary | ICD-10-CM

## 2013-07-13 DIAGNOSIS — I10 Essential (primary) hypertension: Secondary | ICD-10-CM

## 2013-07-13 DIAGNOSIS — I2510 Atherosclerotic heart disease of native coronary artery without angina pectoris: Secondary | ICD-10-CM

## 2013-07-13 DIAGNOSIS — J449 Chronic obstructive pulmonary disease, unspecified: Secondary | ICD-10-CM

## 2013-07-13 DIAGNOSIS — E782 Mixed hyperlipidemia: Secondary | ICD-10-CM

## 2013-07-13 DIAGNOSIS — F3130 Bipolar disorder, current episode depressed, mild or moderate severity, unspecified: Secondary | ICD-10-CM

## 2013-07-13 LAB — CBC WITH DIFFERENTIAL/PLATELET
Basophils Absolute: 0 10*3/uL (ref 0.0–0.1)
Basophils Relative: 1 % (ref 0–1)
Eosinophils Absolute: 0.3 10*3/uL (ref 0.0–0.7)
Eosinophils Relative: 7 % — ABNORMAL HIGH (ref 0–5)
HEMATOCRIT: 38 % (ref 36.0–46.0)
HEMOGLOBIN: 13.3 g/dL (ref 12.0–15.0)
Lymphocytes Relative: 30 % (ref 12–46)
Lymphs Abs: 1.3 10*3/uL (ref 0.7–4.0)
MCH: 33.8 pg (ref 26.0–34.0)
MCHC: 35 g/dL (ref 30.0–36.0)
MCV: 96.4 fL (ref 78.0–100.0)
MONO ABS: 0.5 10*3/uL (ref 0.1–1.0)
MONOS PCT: 11 % (ref 3–12)
NEUTROS ABS: 2.1 10*3/uL (ref 1.7–7.7)
Neutrophils Relative %: 51 % (ref 43–77)
Platelets: 282 10*3/uL (ref 150–400)
RBC: 3.94 MIL/uL (ref 3.87–5.11)
RDW: 14.9 % (ref 11.5–15.5)
WBC: 4.2 10*3/uL (ref 4.0–10.5)

## 2013-07-13 LAB — COMPREHENSIVE METABOLIC PANEL
ALBUMIN: 4.1 g/dL (ref 3.5–5.2)
ALT: 12 U/L (ref 0–35)
AST: 15 U/L (ref 0–37)
Alkaline Phosphatase: 62 U/L (ref 39–117)
BUN: 4 mg/dL — ABNORMAL LOW (ref 6–23)
CO2: 28 meq/L (ref 19–32)
Calcium: 9.6 mg/dL (ref 8.4–10.5)
Chloride: 107 mEq/L (ref 96–112)
Creat: 0.74 mg/dL (ref 0.50–1.10)
GLUCOSE: 80 mg/dL (ref 70–99)
POTASSIUM: 4.7 meq/L (ref 3.5–5.3)
Sodium: 141 mEq/L (ref 135–145)
Total Bilirubin: 0.3 mg/dL (ref 0.2–1.2)
Total Protein: 6.5 g/dL (ref 6.0–8.3)

## 2013-07-13 LAB — LIPID PANEL
CHOLESTEROL: 146 mg/dL (ref 0–200)
HDL: 35 mg/dL — ABNORMAL LOW (ref >39)
LDL Cholesterol: 75 mg/dL (ref 0–99)
TRIGLYCERIDES: 179 mg/dL — AB (ref <150)
Total CHOL/HDL Ratio: 4.2 Ratio
VLDL: 36 mg/dL (ref 0–40)

## 2013-07-13 MED ORDER — CLOBETASOL PROP EMOLLIENT BASE 0.05 % EX CREA: TOPICAL_CREAM | CUTANEOUS | Status: DC

## 2013-07-13 NOTE — Progress Notes (Signed)
Patient ID: Melissa Garcia, female   DOB: 02-24-1960, 53 y.o.   MRN: [id number]   Subjective:    Patient ID: Melissa Garcia, female    DOB: [birth year], 53 y.o.   MRN: [id number]  Patient presents for 2 month F/U and Rash  Patient here to follow chronic medical problems. She was seen about a month ago secondary to worsening depression she is history of bipolar and depression. In the past she has been on Depakote and other medications for bipolar has not been on any mood stabilizers for quite some time. She states she feels like the depression is fairly well-controlled but she still has a lot of anxiety and her mood swings a lot. She was started on Abilify however the insurance company would not cover this. She will like to return to psychiatrist for further treatment  Rash she has history of eczematous rash which reoccurs in the winter and in the summer. It seems she was on triamcinolone cream in the past which did help clear this up.   Review Of Systems:  GEN- denies fatigue, fever, weight loss,weakness, recent illness HEENT- denies eye drainage, change in vision, nasal discharge, CVS- denies chest pain, palpitations RESP- denies SOB, cough, wheeze ABD- denies N/V, change in stools, abd pain GU- denies dysuria, hematuria, dribbling, incontinence MSK- denies joint pain, muscle aches, injury Neuro- denies headache, dizziness, syncope, seizure activity       Objective:    BP 118/68  Pulse 78  Temp(Src) 98.4 F (36.9 C) (Oral)  Resp 16  Ht 5\' 2"  (1.575 m)  Wt 154 lb (69.854 kg)  BMI 28.16 kg/m2  LMP 08/31/2010 GEN- NAD, alert and oriented x3 HEENT- PERRL, EOMI, non injected sclera, pink conjunctiva, MMM, oropharynx clear Neck- Supple, LAD CVS- RRR, no murmur RESP-CTAB Skin- fine erythematous maculoapular rash across top of back , neck, no vesicles, +excoriations Psych- not overly anxious or depressed appearing, normal speech, well groomed, no SI EXT- No edema Pulses- Radial  2+        Assessment & Plan:      Problem List Items Addressed This Visit   Mixed hyperlipidemia   Relevant Orders      Lipid panel      Pneumococcal polysaccharide vaccine 23-valent greater than or equal to 2yo subcutaneous/IM (Completed)   Essential hypertension, benign   Relevant Orders      Pneumococcal polysaccharide vaccine 23-valent greater than or equal to 2yo subcutaneous/IM (Completed)   Eczema   Relevant Medications      Clobetasol Prop Emollient Base (CLOBETASOL PROPIONATE E) 0.05 % emollient cream   CAD (coronary artery disease), native coronary artery   Relevant Orders      CBC with Differential      Comprehensive metabolic panel   BIPOLAR AFFECTIVE DISORDER, DEPRESSED - Primary    Other Visit Diagnoses   Need for prophylactic vaccination against Streptococcus pneumoniae (pneumococcus)        Relevant Orders       Pneumococcal polysaccharide vaccine 23-valent greater than or equal to 2yo subcutaneous/IM (Completed)       Note: This dictation was prepared with Dragon dictation along with smaller phrase technology. Any transcriptional errors that result from this process are unintentional.

## 2013-07-13 NOTE — Patient Instructions (Signed)
Refer to psychiatry- call if you dont have appt in 2 weeks  We will call with lab results New cream for rash twice a day  F/U 3 months

## 2013-07-14 ENCOUNTER — Telehealth: Payer: Self-pay | Admitting: *Deleted

## 2013-07-14 NOTE — Telephone Encounter (Signed)
Message copied by Doyne Keel on Tue Jul 14, 2013 10:00 AM ------      Message from: Jonnie Kind      Created: Sun Jul 12, 2013  1:16 PM       Endometrium was thickened, biopsy is benign , but shows evidence of a small polyp(growth) that is not cancer, but could continue to cause bleeding.  Polyp removal by hysteroscopy recommended. Would need preoperative appt to dicuss/plan ------

## 2013-07-14 NOTE — Telephone Encounter (Signed)
Pt aware per Dr. Glo Herring, biopsy benign, shows evidence of small polyp. Pt has an appt 07/15/2013 will discuss options and plan at that time.

## 2013-07-15 ENCOUNTER — Encounter: Payer: Self-pay | Admitting: Obstetrics and Gynecology

## 2013-07-15 ENCOUNTER — Ambulatory Visit (INDEPENDENT_AMBULATORY_CARE_PROVIDER_SITE_OTHER): Payer: Medicaid Other | Admitting: Obstetrics and Gynecology

## 2013-07-15 ENCOUNTER — Encounter: Payer: Self-pay | Admitting: *Deleted

## 2013-07-15 VITALS — BP 112/60 | Ht 63.0 in | Wt 153.0 lb

## 2013-07-15 DIAGNOSIS — L309 Dermatitis, unspecified: Secondary | ICD-10-CM | POA: Insufficient documentation

## 2013-07-15 DIAGNOSIS — N840 Polyp of corpus uteri: Secondary | ICD-10-CM

## 2013-07-15 NOTE — Progress Notes (Signed)
This chart was scribed by Ludger Nutting, Medical Scribe, for Dr. Mallory Shirk on 07/15/13 at 2:54 PM. This chart was reviewed by Dr. Mallory Shirk for accuracy.   [facility] Visit  Patient name: Melissa Garcia MRN [id number]  Date of birth: 25-Jun-1960  CC & HPI:  JONNELLE LAWNICZAK is a 53 y.o. female presenting today for discussion of endometrial biopsy results which was performed in 07/08/13. Results are: BENIGN WEAKLY SECRETORY ENDOMETRIUM ASSOCIATED WITH A BENIGN ENDOMETRIAL POLYP. On U/S the endometrium was 14 mm.  ROS:  Negative except noted above.  PMB: none at present Pertinent History Reviewed:   Reviewed: Significant for  Medical                                Surgical Hx:    Medications: Reviewed & Updated - see associated section Social History: Reviewed -  reports that she has been smoking Cigarettes.  She has a 15 pack-year smoking history. She has never used smokeless tobacco.  Objective Findings:  Vitals: BP 112/60  Ht 5\' 3"  (1.6 m)  Wt 153 lb (69.4 kg)  BMI 27.11 kg/m2  LMP 08/31/2010  Physical Examination: General appearance - alert, well appearing, and in no distress and oriented to person, place, and time Mental status - alert, oriented to person, place, and time Pelvic - examination not indicated   Assessment & Plan:   A: 1. Benign endometrial polyp   P: 1. Hysteroscopy, D&C, with polypectomy ; schedule for 6 weeks from now. Will need abx prior to procedure due to cardiac stent placement as per advice of cardiology

## 2013-07-15 NOTE — Assessment & Plan Note (Signed)
History of coronary artery disease fasting labs will be done today

## 2013-07-15 NOTE — Assessment & Plan Note (Signed)
Pneumonia vaccien given

## 2013-07-15 NOTE — Assessment & Plan Note (Signed)
Recurrent rash which she gets in the winter and summer. I'll put her on topical clobetasol she will also use a moisturizer on top of this

## 2013-07-15 NOTE — Assessment & Plan Note (Signed)
Blood pressure is well-controlled with no current medications even in setting of her coronary artery disease

## 2013-07-15 NOTE — Assessment & Plan Note (Signed)
We'll continue her Zoloft at current dose she'll be referred to psychiatry for further treatment of her bipolar. She's been on a few medications with no improvement

## 2013-07-15 NOTE — Patient Instructions (Signed)
Hysteroscopy °Hysteroscopy is a procedure used for looking inside the womb (uterus). It may be done for various reasons, including: °· To evaluate abnormal bleeding, fibroid (benign, noncancerous) tumors, polyps, scar tissue (adhesions), and possibly cancer of the uterus. °· To look for lumps (tumors) and other uterine growths. °· To look for causes of why a woman cannot get pregnant (infertility), causes of recurrent loss of pregnancy (miscarriages), or a lost intrauterine device (IUD). °· To perform a sterilization by blocking the fallopian tubes from inside the uterus. °In this procedure, a thin, flexible tube with a tiny light and camera on the end of it (hysteroscope) is used to look inside the uterus. A hysteroscopy should be done right after a menstrual period to be sure you are not pregnant. °LET YOUR HEALTH CARE PROVIDER KNOW ABOUT:  °· Any allergies you have. °· All medicines you are taking, including vitamins, herbs, eye drops, creams, and over-the-counter medicines. °· Previous problems you or members of your family have had with the use of anesthetics. °· Any blood disorders you have. °· Previous surgeries you have had. °· Medical conditions you have. °RISKS AND COMPLICATIONS  °Generally, this is a safe procedure. However, as with any procedure, complications can occur. Possible complications include: °· Putting a hole in the uterus. °· Excessive bleeding. °· Infection. °· Damage to the cervix. °· Injury to other organs. °· Allergic reaction to medicines. °· Too much fluid used in the uterus for the procedure. °BEFORE THE PROCEDURE  °· Ask your health care provider about changing or stopping any regular medicines. °· Do not take aspirin or blood thinners for 1 week before the procedure, or as directed by your health care provider. These can cause bleeding. °· If you smoke, do not smoke for 2 weeks before the procedure. °· In some cases, a medicine is placed in the cervix the day before the procedure.  This medicine makes the cervix have a larger opening (dilate). This makes it easier for the instrument to be inserted into the uterus during the procedure. °· Do not eat or drink anything for at least 8 hours before the surgery. °· Arrange for someone to take you home after the procedure. °PROCEDURE  °· You may be given a medicine to relax you (sedative). You may also be given one of the following: °· A medicine that numbs the area around the cervix (local anesthetic). °· A medicine that makes you sleep through the procedure (general anesthetic). °· The hysteroscope is inserted through the vagina into the uterus. The camera on the hysteroscope sends a picture to a TV screen. This gives the surgeon a good view inside the uterus. °· During the procedure, air or a liquid is put into the uterus, which allows the surgeon to see better. °· Sometimes, tissue is gently scraped from inside the uterus. These tissue samples are sent to a lab for testing. °AFTER THE PROCEDURE  °· If you had a general anesthetic, you may be groggy for a couple hours after the procedure. °· If you had a local anesthetic, you will be able to go home as soon as you are stable and feel ready. °· You may have some cramping. This normally lasts for a couple days. °· You may have bleeding, which varies from light spotting for a few days to menstrual-like bleeding for 3 7 days. This is normal. °· If your test results are not back during the visit, make an appointment with your health care provider to find out   the results. °Document Released: 04/30/2000 Document Revised: 11/12/2012 Document Reviewed: 08/21/2012 °[employer]® Patient Information ©2014 [employer]. ° °

## 2013-07-15 NOTE — Progress Notes (Signed)
SURGERY SCHEDULED °

## 2013-08-04 ENCOUNTER — Encounter: Payer: Self-pay | Admitting: Cardiovascular Disease

## 2013-08-04 ENCOUNTER — Ambulatory Visit (INDEPENDENT_AMBULATORY_CARE_PROVIDER_SITE_OTHER): Payer: Medicaid Other | Admitting: Cardiovascular Disease

## 2013-08-04 VITALS — BP 112/66 | HR 84 | Ht 63.0 in | Wt 152.8 lb

## 2013-08-04 DIAGNOSIS — I25118 Atherosclerotic heart disease of native coronary artery with other forms of angina pectoris: Secondary | ICD-10-CM

## 2013-08-04 DIAGNOSIS — E782 Mixed hyperlipidemia: Secondary | ICD-10-CM

## 2013-08-04 DIAGNOSIS — I2510 Atherosclerotic heart disease of native coronary artery without angina pectoris: Secondary | ICD-10-CM

## 2013-08-04 DIAGNOSIS — I10 Essential (primary) hypertension: Secondary | ICD-10-CM

## 2013-08-04 DIAGNOSIS — I209 Angina pectoris, unspecified: Secondary | ICD-10-CM

## 2013-08-04 DIAGNOSIS — R079 Chest pain, unspecified: Secondary | ICD-10-CM

## 2013-08-04 NOTE — Patient Instructions (Signed)
  We will see you back in follow up in 6 months with an extender and 1 year with Dr Gwenlyn Found.  Dr Gwenlyn Found has ordered a [employer]- this is a test that looks at the blood flow to your heart muscle.  It takes approximately 2 1/2 hours. Please follow instruction sheet, as given.

## 2013-08-04 NOTE — Assessment & Plan Note (Signed)
Well-controlled on no medications 

## 2013-08-04 NOTE — Assessment & Plan Note (Signed)
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 Acie Fredrickson . The remainder of her coronary arteries were free of significant disease. Her EF was 30%. Followup 2-D echo performed within the last several years revealed normal left ventricular ejection fraction. She does complain of some chest pain radiating to her neck neck and shoulders.I am going to obtain a pharmacologic Myoview stress test to risk stratify her because of her inability to exercise.

## 2013-08-04 NOTE — Assessment & Plan Note (Signed)
Not on statin therapy with recent lipid profile performed 07/15/13 revealing a total cholesterol of 146, LDL 75 HDL of 35

## 2013-08-04 NOTE — Progress Notes (Signed)
08/04/2013 Melissa Garcia Melissa Garcia   [birth year]  [id number]  Primary Physician [name], PA-C Primary Cardiologist: Lorretta Harp MD Renae Gloss   HPI:  [name] is a 53 year old mildly overweight divorced Caucasian female mother of 3 children, grandmother at night when children who does not work. She was referred by Dr. Vic Garcia at [facility]  practice for cardiovascular evaluation because of the known history of CAD and chest pain. Her cardiovascular risk factors include close to 20 pack years of tobacco abuse smoking one half pack per day. She has known CAD status post anterior wall myocardial infarction 10/05/08 by Dr. Nausea or. She had stenting of her proximal LAD with a Xience drug-eluting stent. At that time was 30% lymphocytes was shown to improve normal bite to the echocardiography. She does complain of pain in her chest neck and shoulders.   Current Outpatient Prescriptions  Medication Sig Dispense Refill  . albuterol (PROVENTIL HFA;VENTOLIN HFA) 108 (90 BASE) MCG/ACT inhaler Inhale 2 puffs into the lungs every 4 (four) hours as needed for wheezing.      Marland Kitchen aspirin EC 81 MG tablet Take 81 mg by mouth daily.        . budesonide-formoterol (SYMBICORT) 160-4.5 MCG/ACT inhaler Inhale 2 puffs into the lungs 2 (two) times daily.      . Clobetasol Prop Emollient Base (CLOBETASOL PROPIONATE E) 0.05 % emollient cream Apply to back twice a day for rash  60 g  3  . nitroGLYCERIN (NITROSTAT) 0.4 MG SL tablet Place 1 tablet (0.4 mg total) under the tongue every 5 (five) minutes as needed for chest pain.  25 tablet  3  . Omega-3 Fatty Acids (FISH OIL PO) Take 1 capsule by mouth 2 (two) times daily.       . ranitidine (ZANTAC) 75 MG tablet Take 75 mg by mouth daily.      . sertraline (ZOLOFT) 50 MG tablet Take 1 tablet (50 mg total) by mouth daily.  30 tablet  1   No current facility-administered medications for this visit.    Allergies  Allergen Reactions  .  Codeine Nausea And Vomiting    Vicodin/Percocet Specified= excessive vomiting  . Depakote [Divalproex Sodium]     "Mood Swings--Felt like out of my mind"  . Sulfonamide Derivatives Hives  . Toviaz [Fesoterodine Fumarate Er]     Extremely dry mouth, felt hot like hotflashes. Felt dehydrated.    History   Social History  . Marital Status: Single    Spouse Name: N/A    Number of Children: N/A  . Years of Education: N/A   Occupational History  . Disabled    Social History Main Topics  . Smoking status: Current Some Day Smoker -- 0.50 packs/day for 30 years    Types: Cigarettes  . Smokeless tobacco: Never Used  . Alcohol Use: No  . Drug Use: No  . Sexual Activity: Yes    Birth Control/ Protection: Post-menopausal   Other Topics Concern  . Not on file   Social History Narrative  . No narrative on file     Review of Systems: General: negative for chills, fever, night sweats or weight changes.  Cardiovascular: negative for chest pain, dyspnea on exertion, edema, orthopnea, palpitations, paroxysmal nocturnal dyspnea or shortness of breath Dermatological: negative for rash Respiratory: negative for cough or wheezing Urologic: negative for hematuria Abdominal: negative for nausea, vomiting, diarrhea, bright red blood per rectum, melena, or hematemesis Neurologic: negative for visual changes,  syncope, or dizziness All other systems reviewed and are otherwise negative except as noted above.    Blood pressure 112/66, pulse 84, height 5\' 3"  (1.6 m), weight 152 lb 12.8 oz (69.31 kg), last menstrual period 08/31/2010.  General appearance: alert and no distress Neck: no adenopathy, no carotid bruit, no JVD, supple, symmetrical, trachea midline and thyroid not enlarged, symmetric, no tenderness/mass/nodules Lungs: clear to auscultation bilaterally Heart: regular rate and rhythm, S1, S2 normal, no murmur, click, rub or gallop Extremities: extremities normal, atraumatic, no cyanosis  or edema  EKG sinus rhythm 84 without ST or T wave changes  ASSESSMENT AND PLAN:   CAD (coronary artery disease), native coronary artery History of CAD status post anterior wall myocardial infarction 10/05/08 with stenting of her LAD by Dr. Acie Fredrickson . The remainder of her coronary arteries were free of significant disease. Her EF was 30%. Followup 2-D echo performed within the last several years revealed normal left ventricular ejection fraction. She does complain of some chest pain radiating to her neck neck and shoulders.I am going to obtain a pharmacologic Myoview stress test to risk stratify her because of her inability to exercise.  Essential hypertension Well-controlled on no medications  Mixed hyperlipidemia Not on statin therapy with recent lipid profile performed 07/15/13 revealing a total cholesterol of 146, LDL 75 HDL of 35      Lorretta Harp MD [facility], [facility] 08/04/2013 3:24 PM

## 2013-08-11 ENCOUNTER — Telehealth (HOSPITAL_COMMUNITY): Payer: Self-pay

## 2013-08-11 NOTE — Telephone Encounter (Signed)
Encounter complete. 

## 2013-08-12 ENCOUNTER — Telehealth (HOSPITAL_COMMUNITY): Payer: Self-pay

## 2013-08-12 NOTE — Telephone Encounter (Signed)
Encounter complete. 

## 2013-08-13 ENCOUNTER — Encounter (HOSPITAL_COMMUNITY): Payer: Medicaid Other

## 2013-08-13 ENCOUNTER — Encounter (HOSPITAL_COMMUNITY): Payer: Self-pay | Admitting: Pharmacy Technician

## 2013-08-25 ENCOUNTER — Other Ambulatory Visit: Payer: Self-pay | Admitting: Obstetrics and Gynecology

## 2013-08-25 NOTE — H&P (Signed)
              Progress Notes RIGBY SWAMY (MR# [id number])         Progress Notes Info    Author Note Status Last Update User Last Update Date/Time   Jonnie Kind, MD Signed Jonnie Kind, MD 07/15/2013 3:07 PM         Progress Notes    This chart was scribed by Ludger Nutting, Medical Scribe, for Dr. Mallory Shirk on 07/15/13 at 2:54 PM. This chart was reviewed by Dr. Mallory Shirk for accuracy.    [facility] Visit    Patient name: Melissa Garcia MRN [id number] Date of birth: 07-06-1960    CC & HPI:    Melissa Garcia is a 53 y.o. female presenting today for discussion of endometrial biopsy results which was performed in 07/08/13. Results are: BENIGN WEAKLY SECRETORY ENDOMETRIUM ASSOCIATED WITH A BENIGN ENDOMETRIAL  POLYP.  On U/S the endometrium was 14 mm.    ROS:    Negative except noted above.  PMB: none at present    Pertinent History Reviewed:    Reviewed: Significant for  Medical  Surgical Hx:  Medications: Reviewed & Updated - see associated section  Social History: Reviewed - reports that she has been smoking Cigarettes. She has a 15 pack-year smoking history. She has never used smokeless tobacco.    Objective Findings:    Vitals: BP 112/60  Ht 5\' 3"  (1.6 m)  Wt 153 lb (69.4 kg)  BMI 27.11 kg/m2  LMP 08/31/2010  Physical Examination: General appearance - alert, well appearing, and in no distress and oriented to person, place, and time  Mental status - alert, oriented to person, place, and time  Pelvic - examination not indicated    Assessment & Plan:    A:  1. Benign endometrial polyp  P:  1. Hysteroscopy, D&C, with polypectomy  ;  Will need abx prior to procedure due to cardiac stent placement as per advice of cardiology

## 2013-08-26 ENCOUNTER — Ambulatory Visit (INDEPENDENT_AMBULATORY_CARE_PROVIDER_SITE_OTHER): Payer: Medicaid Other | Admitting: Obstetrics and Gynecology

## 2013-08-26 ENCOUNTER — Encounter: Payer: Self-pay | Admitting: Obstetrics and Gynecology

## 2013-08-26 ENCOUNTER — Encounter (HOSPITAL_COMMUNITY): Admission: RE | Admit: 2013-08-26 | Payer: Medicaid Other | Source: Ambulatory Visit

## 2013-08-26 VITALS — BP 120/76 | Ht 63.0 in | Wt 152.0 lb

## 2013-08-26 DIAGNOSIS — N393 Stress incontinence (female) (male): Secondary | ICD-10-CM

## 2013-08-26 DIAGNOSIS — N950 Postmenopausal bleeding: Secondary | ICD-10-CM

## 2013-08-26 NOTE — Patient Instructions (Signed)
Surgery to be postponed urology to be referred and schedules coordinated

## 2013-08-26 NOTE — Progress Notes (Signed)
This chart was scribed by Ludger Nutting, Medical Scribe, for Dr. Mallory Shirk on 08/26/13 at 10:46 AM. This chart was reviewed by Dr. Mallory Shirk for accuracy.  Preoperative History and Physical  Melissa Garcia is a 53 y.o. [id number] here for surgical management of postmenopausal bleeding x1 after a year of amenorrhea. Pt has had endometrial biopsy with benign findings.   Endometrial Biopsy Results: BENIGN WEAKLY SECRETORY ENDOMETRIUM ASSOCIATED WITH A BENIGN ENDOMETRIAL POLYP. U/S shows endometrium was 14 mm.  Proposed surgery: Hysteroscopy, D&C, with polypectomy  Past Medical History  Diagnosis Date  . Coronary atherosclerosis of native coronary artery     DES LAD and PTCA diagonal 8/10, LVEF 30% 8/10  . Myocardial infarct     AMI 8/10  . Essential hypertension, benign   . Urinary incontinence   . Bipolar disorder   . COPD (chronic obstructive pulmonary disease)   . Eczema   . Stress incontinence   . Fibroids    Past Surgical History  Procedure Laterality Date  . Appendectomy    . Tubal ligation    . Coronary stent placement      heart stent  . Cataract extraction w/phaco Left 01/26/2013    Procedure: LEFT EYE CATARACT EXTRACTION PHACO AND INTRAOCULAR LENS PLACEMENT  CDE=6.68;  Surgeon: Tonny Branch, MD;  Location: [facility];  Service: Ophthalmology;  Laterality: Left;  Marland Kitchen Eye surgery Bilateral 01/2013    cataracts  . Cataract extraction w/phaco Right 01/08/2013    Procedure: CATARACT EXTRACTION PHACO AND INTRAOCULAR LENS PLACEMENT (IOC);  Surgeon: Tonny Branch, MD;  Location: [facility];  Service: Ophthalmology;  Laterality: Right;  CDE 9.04   OB History   Grav Para Term Preterm Abortions TAB SAB Ect Mult Living   5 3 0 0 2 0 2 0 0 3      Patient denies any cervical dysplasia or STIs. LAST PAP 06/2013;;ASCUS WITH NEGATIVE HPV, REPEAT 3 YR.  (Not in a hospital admission)  Allergies  Allergen Reactions  . Codeine Nausea And Vomiting    Vicodin/Percocet Specified= excessive vomiting   . Depakote [Divalproex Sodium]     "Mood Swings--Felt like out of my mind"  . Sulfonamide Derivatives Hives  . Toviaz [Fesoterodine Fumarate Er]     Extremely dry mouth, felt hot like hotflashes. Felt dehydrated.   Social History:   reports that she has been smoking Cigarettes.  She has a 15 pack-year smoking history. She has never used smokeless tobacco. She reports that she does not drink alcohol or use illicit drugs. Family History  Problem Relation Age of Onset  . Coronary artery disease    . Hypertension    . Cancer Father     lung  . Cancer Mother     skin  . Stroke Mother   . Birth defects Sister     born with hole in her heart    Review of Systems: patient reports urinary incontinence that is worse with stress SYMPTOMS predominantly  PHYSICAL EXAM: Blood pressure 120/76, height 5\' 3"  (1.6 m), weight 152 lb (68.947 kg), last menstrual period 08/31/2010. General appearance - alert, well appearing, and in no distress Chest - clear to auscultation, no wheezes, rales or rhonchi, symmetric air entry Heart - normal rate and regular rhythm Abdomen - soft, nontender, nondistended, no masses or organomegaly Pelvic -  VULVA: normal appearing vulva with no masses, tenderness or lesions,  VAGINA: normal appearing vagina with normal color and discharge, good support no lesions,  MARSHALL TEST + LOSS  IMMEDIATELY OF URINE WITH COUGH CERVIX: normal appearing cervix without discharge or lesions,  UTERUS: uterus is normal size, shape, consistency and nontender, . URINARY: +marshalls test URINE LOSS Extremities - peripheral pulses normal, no pedal edema, no clubbing or cyanosis  Labs: No results found for this or any previous visit (from the past 336 hour(s)).  Imaging Studies: No results found.  Assessment: Patient Active Problem List   Diagnosis Date Noted  . Essential hypertension 08/04/2013  . Dermatitis 07/15/2013  . Endometrial polyp 07/15/2013  . Fibroids 07/08/2013  .  Post-menopausal bleeding 07/08/2013  . ASCUS Pap w/ Neg HRHPV 06/30/2013  . Uterine fibroids 06/30/2013  . Postmenopausal bleeding 06/22/2013  . COPD exacerbation 05/12/2013  . Stress incontinence   . COPD (chronic obstructive pulmonary disease)   . Essential hypertension, benign 06/06/2011  . Mixed hyperlipidemia 06/06/2011  . CAD (coronary artery disease), native coronary artery 11/01/2010  . EMPHYSEMA 09/11/2007  . TOBACCO ABUSE 04/25/2007  . BIPOLAR AFFECTIVE DISORDER, DEPRESSED 09/17/2006    Plan: Patient will undergo surgical management with Hysteroscopy, D&C, with polypectomy.   We will postpone hysteroscopy to attempt to coordinate with urology, will send to [place] urology, and try to coordinate surgery if sling is chosen

## 2013-09-01 ENCOUNTER — Ambulatory Visit (HOSPITAL_COMMUNITY)
Admission: RE | Admit: 2013-09-01 | Payer: Medicaid Other | Source: Ambulatory Visit | Admitting: Obstetrics and Gynecology

## 2013-09-01 ENCOUNTER — Encounter (HOSPITAL_COMMUNITY): Admission: RE | Payer: Self-pay | Source: Ambulatory Visit

## 2013-09-01 SURGERY — DILATATION & CURETTAGE/HYSTEROSCOPY WITH THERMACHOICE ABLATION
Anesthesia: General

## 2013-09-04 ENCOUNTER — Ambulatory Visit: Payer: Medicaid Other | Admitting: Family Medicine

## 2013-09-06 ENCOUNTER — Encounter (HOSPITAL_COMMUNITY): Payer: Self-pay | Admitting: Emergency Medicine

## 2013-09-06 ENCOUNTER — Emergency Department (HOSPITAL_COMMUNITY)
Admission: EM | Admit: 2013-09-06 | Discharge: 2013-09-06 | Disposition: A | Discharge status: Home / Self Care | Payer: Medicaid Other | Source: Home / Self Care | Attending: Family Medicine | Admitting: Family Medicine

## 2013-09-06 DIAGNOSIS — J309 Allergic rhinitis, unspecified: Secondary | ICD-10-CM

## 2013-09-06 DIAGNOSIS — J302 Other seasonal allergic rhinitis: Secondary | ICD-10-CM

## 2013-09-06 MED ORDER — CETIRIZINE HCL 10 MG PO TABS: 10.0000 mg | ORAL_TABLET | Freq: Every day | ORAL | Status: DC

## 2013-09-06 MED ORDER — IPRATROPIUM BROMIDE 0.06 % NA SOLN: 2.0000 | Freq: Four times a day (QID) | NASAL | Status: DC

## 2013-09-06 NOTE — ED Provider Notes (Signed)
CSN: [id number]     Arrival date & time 09/06/13  0920 History   First MD Initiated Contact with Patient 09/06/13 [phone]     Chief Complaint  Patient presents with  . Otalgia   (Consider location/radiation/quality/duration/timing/severity/associated sxs/prior Treatment) Patient is a 53 y.o. female presenting with ear pain. The history is provided by the patient.  Otalgia Location:  Left Quality:  Aching Severity:  Mild Duration:  1 week Progression:  Worsening Chronicity:  New Relieved by:  None tried Worsened by:  Nothing tried Ineffective treatments:  None tried Associated symptoms: congestion, rhinorrhea and sore throat   Associated symptoms: no ear discharge and no fever     Past Medical History  Diagnosis Date  . Coronary atherosclerosis of native coronary artery     DES LAD and PTCA diagonal 8/10, LVEF 30% 8/10  . Myocardial infarct     AMI 8/10  . Essential hypertension, benign   . Urinary incontinence   . Bipolar disorder   . COPD (chronic obstructive pulmonary disease)   . Eczema   . Stress incontinence   . Fibroids    Past Surgical History  Procedure Laterality Date  . Appendectomy    . Tubal ligation    . Coronary stent placement      heart stent  . Cataract extraction w/phaco Left 01/26/2013    Procedure: LEFT EYE CATARACT EXTRACTION PHACO AND INTRAOCULAR LENS PLACEMENT  CDE=6.68;  Surgeon: Tonny Branch, MD;  Location: [facility];  Service: Ophthalmology;  Laterality: Left;  Marland Kitchen Eye surgery Bilateral 01/2013    cataracts  . Cataract extraction w/phaco Right 01/08/2013    Procedure: CATARACT EXTRACTION PHACO AND INTRAOCULAR LENS PLACEMENT (IOC);  Surgeon: Tonny Branch, MD;  Location: [facility];  Service: Ophthalmology;  Laterality: Right;  CDE 9.04   Family History  Problem Relation Age of Onset  . Coronary artery disease    . Hypertension    . Cancer Father     lung  . Cancer Mother     skin  . Stroke Mother   . Birth defects Sister     born with hole in her  heart   History  Substance Use Topics  . Smoking status: Current Some Day Smoker -- 0.50 packs/day for 30 years    Types: Cigarettes  . Smokeless tobacco: Never Used  . Alcohol Use: No   OB History   Grav Para Term Preterm Abortions TAB SAB Ect Mult Living   5 3 0 0 2 0 2 0 0 3      Review of Systems  Constitutional: Negative.  Negative for fever.  HENT: Positive for congestion, ear pain, rhinorrhea and sore throat. Negative for ear discharge.     Allergies  Codeine; Depakote; Sulfonamide derivatives; and Toviaz  Home Medications   Prior to Admission medications   Medication Sig Start Date End Date Taking? Authorizing Provider  albuterol (PROVENTIL HFA;VENTOLIN HFA) 108 (90 BASE) MCG/ACT inhaler Inhale 2 puffs into the lungs every 4 (four) hours as needed for wheezing. 08/28/12   Susy Frizzle, MD  aspirin EC 81 MG tablet Take 81 mg by mouth daily.      Historical Provider, MD  budesonide-formoterol (SYMBICORT) 160-4.5 MCG/ACT inhaler Inhale 2 puffs into the lungs 2 (two) times daily.    Historical Provider, MD  cetirizine (ZYRTEC) 10 MG tablet Take 1 tablet (10 mg total) by mouth daily. One tab daily for allergies 09/06/13   Billy Fischer, MD  Clobetasol Prop Emollient Base (CLOBETASOL  PROPIONATE E) 0.05 % emollient cream Apply 1 application topically 2 (two) times daily. To back for rash    Historical Provider, MD  ipratropium (ATROVENT) 0.06 % nasal spray Place 2 sprays into both nostrils 4 (four) times daily. 09/06/13   Billy Fischer, MD  nitroGLYCERIN (NITROSTAT) 0.4 MG SL tablet Place 1 tablet (0.4 mg total) under the tongue every 5 (five) minutes as needed for chest pain. 02/04/13   Satira Sark, MD  Omega-3 Fatty Acids (FISH OIL PO) Take 1 capsule by mouth 2 (two) times daily.     Historical Provider, MD  ranitidine (ZANTAC) 75 MG tablet Take 75 mg by mouth daily as needed for heartburn.     Historical Provider, MD  sertraline (ZOLOFT) 50 MG tablet Take 1 tablet (50 mg  total) by mouth daily. 06/01/13   Lonie Peak Dixon, PA-C   BP 138/80  Pulse 82  Temp(Src) 98.6 F (37 C) (Oral)  Resp 14  SpO2 97%  LMP 08/31/2010 Physical Exam  Nursing note and vitals reviewed. Constitutional: She is oriented to person, place, and time. She appears well-developed and well-nourished. No distress.  HENT:  Right Ear: External ear normal.  Left Ear: External ear normal.  Nose: Mucosal edema and rhinorrhea present.  Mouth/Throat: Oropharynx is clear and moist. No oropharyngeal exudate.  Neck: Normal range of motion. Neck supple.  Lymphadenopathy:    She has no cervical adenopathy.  Neurological: She is alert and oriented to person, place, and time.  Skin: Skin is warm and dry.    ED Course  Procedures (including critical care time) Labs Review Labs Reviewed - No data to display  Imaging Review No results found.   MDM   1. Seasonal allergic reaction        Billy Fischer, MD 09/06/13 [phone]

## 2013-09-06 NOTE — ED Notes (Signed)
Patient c/o left ear pain x 1 week. Patient reports she has a lot of sinus pressure and also has pain in the right ear this morning. Patient is alert and oriented and in no acute distress.

## 2013-09-07 ENCOUNTER — Telehealth: Payer: Self-pay | Admitting: Physician Assistant

## 2013-09-08 ENCOUNTER — Encounter: Payer: Medicaid Other | Admitting: Obstetrics and Gynecology

## 2013-09-08 ENCOUNTER — Encounter: Payer: Self-pay | Admitting: Family Medicine

## 2013-09-08 ENCOUNTER — Ambulatory Visit (INDEPENDENT_AMBULATORY_CARE_PROVIDER_SITE_OTHER): Payer: Medicaid Other | Admitting: Family Medicine

## 2013-09-08 VITALS — BP 110/68 | HR 68 | Temp 98.7°F | Resp 16 | Ht 63.0 in | Wt 153.0 lb

## 2013-09-08 DIAGNOSIS — J019 Acute sinusitis, unspecified: Secondary | ICD-10-CM

## 2013-09-08 MED ORDER — AMOXICILLIN 875 MG PO TABS: 875.0000 mg | ORAL_TABLET | Freq: Two times a day (BID) | ORAL | Status: DC

## 2013-09-08 NOTE — Progress Notes (Signed)
Subjective:    Patient ID: Melissa Garcia, female    DOB: [birth year], 53 y.o.   MRN: [id number]  HPI Was seen at urgent care 8/2 for otalgia and diagnosed with allergies.  Patient states she's had pain and pressure in her left ear for over one week. This is associated with severe nasal congestion and postnasal drip. Over the last few days she is also developed pain and pressure behind the left eye and a dull constant headache. She also reports subjective fevers and chills. He denies any pain in the jaw joint with chewing. She denies any vision changes. Examination of the left ear reveals a normal tympanic membrane with no erythema or signs of otitis media. However the patient is tender to palpation over the left frontal left maxillary sinus. Past Medical History  Diagnosis Date  . Coronary atherosclerosis of native coronary artery     DES LAD and PTCA diagonal 8/10, LVEF 30% 8/10  . Myocardial infarct     AMI 8/10  . Essential hypertension, benign   . Urinary incontinence   . Bipolar disorder   . COPD (chronic obstructive pulmonary disease)   . Eczema   . Stress incontinence   . Fibroids    Current Outpatient Prescriptions on File Prior to Visit  Medication Sig Dispense Refill  . albuterol (PROVENTIL HFA;VENTOLIN HFA) 108 (90 BASE) MCG/ACT inhaler Inhale 2 puffs into the lungs every 4 (four) hours as needed for wheezing.      Marland Kitchen aspirin EC 81 MG tablet Take 81 mg by mouth daily.        . budesonide-formoterol (SYMBICORT) 160-4.5 MCG/ACT inhaler Inhale 2 puffs into the lungs 2 (two) times daily.      . cetirizine (ZYRTEC) 10 MG tablet Take 1 tablet (10 mg total) by mouth daily. One tab daily for allergies  30 tablet  1  . Clobetasol Prop Emollient Base (CLOBETASOL PROPIONATE E) 0.05 % emollient cream Apply 1 application topically 2 (two) times daily. To back for rash      . ipratropium (ATROVENT) 0.06 % nasal spray Place 2 sprays into both nostrils 4 (four) times daily.  15 mL  1  .  nitroGLYCERIN (NITROSTAT) 0.4 MG SL tablet Place 1 tablet (0.4 mg total) under the tongue every 5 (five) minutes as needed for chest pain.  25 tablet  3  . Omega-3 Fatty Acids (FISH OIL PO) Take 1 capsule by mouth 2 (two) times daily.       . ranitidine (ZANTAC) 75 MG tablet Take 75 mg by mouth daily as needed for heartburn.       . sertraline (ZOLOFT) 50 MG tablet Take 1 tablet (50 mg total) by mouth daily.  30 tablet  1   No current facility-administered medications on file prior to visit.   Allergies  Allergen Reactions  . Codeine Nausea And Vomiting    Vicodin/Percocet Specified= excessive vomiting  . Depakote [Divalproex Sodium]     "Mood Swings--Felt like out of my mind"  . Sulfonamide Derivatives Hives  . Toviaz [Fesoterodine Fumarate Er]     Extremely dry mouth, felt hot like hotflashes. Felt dehydrated.   History   Social History  . Marital Status: Single    Spouse Name: N/A    Number of Children: N/A  . Years of Education: N/A   Occupational History  . Disabled    Social History Main Topics  . Smoking status: Current Some Day Smoker -- 0.50 packs/day for 30 years  Types: Cigarettes  . Smokeless tobacco: Never Used  . Alcohol Use: No  . Drug Use: No  . Sexual Activity: Yes    Birth Control/ Protection: Post-menopausal   Other Topics Concern  . Not on file   Social History Narrative  . No narrative on file      Review of Systems  All other systems reviewed and are negative.      Objective:   Physical Exam  HENT:  Head: Normocephalic and atraumatic.  Right Ear: Tympanic membrane and ear canal normal.  Left Ear: Tympanic membrane and ear canal normal.  Nose: Mucosal edema and rhinorrhea present. Right sinus exhibits no maxillary sinus tenderness and no frontal sinus tenderness. Left sinus exhibits maxillary sinus tenderness and frontal sinus tenderness.          Assessment & Plan:  1. Acute rhinosinusitis There is no sign of TMJ or temporal  arteritis. I believe the patient likely has sinusitis with eustachian tube dysfunction. I recommended she continue the nasal spray she was given at the urgent care. However also add amoxicillin 875 mg by mouth twice a day for 10 days. Recheck in 1 week if no better or sooner if worse. - amoxicillin (AMOXIL) 875 MG tablet; Take 1 tablet (875 mg total) by mouth 2 (two) times daily.  Dispense: 20 tablet; Refill: 0

## 2013-09-16 ENCOUNTER — Telehealth: Payer: Self-pay

## 2013-09-16 NOTE — Telephone Encounter (Signed)
Tried to call with no answer  

## 2013-09-18 NOTE — Telephone Encounter (Signed)
LMOM to call.

## 2013-09-21 ENCOUNTER — Telehealth: Payer: Self-pay

## 2013-09-21 ENCOUNTER — Telehealth: Payer: Self-pay | Admitting: Obstetrics and Gynecology

## 2013-09-21 ENCOUNTER — Other Ambulatory Visit (HOSPITAL_COMMUNITY): Payer: Self-pay | Admitting: Cardiovascular Disease

## 2013-09-21 DIAGNOSIS — R079 Chest pain, unspecified: Secondary | ICD-10-CM

## 2013-09-21 NOTE — Telephone Encounter (Signed)
See note dated 09/21/2013. Pt having other issues and will call when ready to schedule the colonoscopy.

## 2013-09-21 NOTE — Telephone Encounter (Signed)
Pt called this afternoon to let us know that her PCP had referred her to [place] some time back to have her first colonoscopy, but she is having other issues and procedures with her urologist first and wants to hold off on scheduling the tcs for now. She said that she would be in touch with [place] when she is ready.

## 2013-09-21 NOTE — Telephone Encounter (Signed)
Noted  

## 2013-09-22 NOTE — Telephone Encounter (Signed)
Patient discussed with Dr Elonda Husky, who will see the patient for urodynamic testing in the office, and evaluation of her incontinence symptoms. The patient is fine with this plan, and will expect a call from this office.

## 2013-09-22 NOTE — Telephone Encounter (Signed)
Routing to referring provider, Knute Neu, midwife at [facility].

## 2013-09-23 ENCOUNTER — Ambulatory Visit: Payer: Medicaid Other | Admitting: Obstetrics and Gynecology

## 2013-09-23 NOTE — Telephone Encounter (Signed)
Please schedule this patint for a urodynamics on 10/07/2013 at 1100

## 2013-09-23 NOTE — Telephone Encounter (Signed)
Pt informed appt scheduled with Dr. Elonda Husky for Urodynamics on 10/07/2013 at 11 am.

## 2013-09-24 ENCOUNTER — Telehealth (HOSPITAL_COMMUNITY): Payer: Self-pay

## 2013-09-24 NOTE — Telephone Encounter (Signed)
Encounter complete. 

## 2013-09-29 ENCOUNTER — Encounter (HOSPITAL_COMMUNITY): Payer: Medicaid Other

## 2013-10-01 ENCOUNTER — Telehealth (HOSPITAL_COMMUNITY): Payer: Self-pay

## 2013-10-01 NOTE — Telephone Encounter (Signed)
Encounter complete. 

## 2013-10-06 ENCOUNTER — Telehealth (HOSPITAL_COMMUNITY): Payer: Self-pay

## 2013-10-06 ENCOUNTER — Encounter (HOSPITAL_COMMUNITY): Payer: Medicaid Other

## 2013-10-06 NOTE — Telephone Encounter (Signed)
Encounter complete. 

## 2013-10-07 ENCOUNTER — Encounter: Payer: Medicaid Other | Admitting: Obstetrics & Gynecology

## 2013-10-14 ENCOUNTER — Encounter: Payer: Medicaid Other | Admitting: Obstetrics & Gynecology

## 2013-10-21 ENCOUNTER — Encounter: Payer: Self-pay | Admitting: Obstetrics & Gynecology

## 2013-10-21 ENCOUNTER — Ambulatory Visit (INDEPENDENT_AMBULATORY_CARE_PROVIDER_SITE_OTHER): Payer: Medicaid Other | Admitting: Obstetrics & Gynecology

## 2013-10-21 VITALS — BP 130/70 | Ht 63.0 in | Wt 155.0 lb

## 2013-10-21 DIAGNOSIS — N393 Stress incontinence (female) (male): Secondary | ICD-10-CM

## 2013-10-21 DIAGNOSIS — N3642 Intrinsic sphincter deficiency (ISD): Secondary | ICD-10-CM

## 2013-10-27 ENCOUNTER — Telehealth (HOSPITAL_COMMUNITY): Payer: Self-pay | Admitting: *Deleted

## 2013-10-27 ENCOUNTER — Other Ambulatory Visit (HOSPITAL_COMMUNITY): Payer: Self-pay | Admitting: Cardiovascular Disease

## 2013-10-27 DIAGNOSIS — R079 Chest pain, unspecified: Secondary | ICD-10-CM

## 2013-10-28 ENCOUNTER — Telehealth: Payer: Self-pay | Admitting: Obstetrics and Gynecology

## 2013-10-29 ENCOUNTER — Encounter: Payer: Self-pay | Admitting: Adult Health

## 2013-10-29 ENCOUNTER — Ambulatory Visit (INDEPENDENT_AMBULATORY_CARE_PROVIDER_SITE_OTHER): Payer: Medicaid Other | Admitting: Adult Health

## 2013-10-29 VITALS — BP 148/76 | Ht 63.0 in | Wt 157.0 lb

## 2013-10-29 DIAGNOSIS — R319 Hematuria, unspecified: Secondary | ICD-10-CM | POA: Insufficient documentation

## 2013-10-29 DIAGNOSIS — N3946 Mixed incontinence: Secondary | ICD-10-CM

## 2013-10-29 DIAGNOSIS — R300 Dysuria: Secondary | ICD-10-CM

## 2013-10-29 DIAGNOSIS — N390 Urinary tract infection, site not specified: Secondary | ICD-10-CM

## 2013-10-29 HISTORY — DX: Urinary tract infection, site not specified: N39.0

## 2013-10-29 LAB — POCT URINALYSIS DIPSTICK
Glucose, UA: NEGATIVE
NITRITE UA: NEGATIVE

## 2013-10-29 MED ORDER — PHENAZOPYRIDINE HCL 200 MG PO TABS: 200.0000 mg | ORAL_TABLET | Freq: Three times a day (TID) | ORAL | Status: DC | PRN

## 2013-10-29 MED ORDER — CIPROFLOXACIN HCL 500 MG PO TABS: 500.0000 mg | ORAL_TABLET | Freq: Two times a day (BID) | ORAL | Status: DC

## 2013-10-29 NOTE — Patient Instructions (Signed)
Urinary Incontinence Urinary incontinence is the involuntary loss of urine from your bladder. CAUSES  There are many causes of urinary incontinence. They include:  Medicines.  Infections.  Prostatic enlargement, leading to overflow of urine from your bladder.  Surgery.  Neurological diseases.  Emotional factors. SIGNS AND SYMPTOMS Urinary Incontinence can be divided into four types: 1. Urge incontinence. Urge incontinence is the involuntary loss of urine before you have the opportunity to go to the bathroom. There is a sudden urge to void but not enough time to reach a bathroom. 2. Stress incontinence. Stress incontinence is the sudden loss of urine with any activity that forces urine to pass. It is commonly caused by anatomical changes to the pelvis and sphincter areas of your body. 3. Overflow incontinence. Overflow incontinence is the loss of urine from an obstructed opening to your bladder. This results in a backup of urine and a resultant buildup of pressure within the bladder. When the pressure within the bladder exceeds the closing pressure of the sphincter, the urine overflows, which causes incontinence, similar to water overflowing a dam. 4. Total incontinence. Total incontinence is the loss of urine as a result of the inability to store urine within your bladder. DIAGNOSIS  Evaluating the cause of incontinence may require:  A thorough and complete medical and obstetric history.  A complete physical exam.  Laboratory tests such as a urine culture and sensitivities. When additional tests are indicated, they can include:  An ultrasound exam.  Kidney and bladder X-rays.  Cystoscopy. This is an exam of the bladder using a narrow scope.  Urodynamic testing to test the nerve function to the bladder and sphincter areas. TREATMENT  Treatment for urinary incontinence depends on the cause:  For urge incontinence caused by a bacterial infection, antibiotics will be prescribed.  If the urge incontinence is related to medicines you take, your health care provider may have you change the medicine.  For stress incontinence, surgery to re-establish anatomical support to the bladder or sphincter, or both, will often correct the condition.  For overflow incontinence caused by an enlarged prostate, an operation to open the channel through the enlarged prostate will allow the flow of urine out of the bladder. In women with fibroids, a hysterectomy may be recommended.  For total incontinence, surgery on your urinary sphincter may help. An artificial urinary sphincter (an inflatable cuff placed around the urethra) may be required. In women who have developed a hole-like passage between their bladder and vagina (vesicovaginal fistula), surgery to close the fistula often is required. HOME CARE INSTRUCTIONS  Normal daily hygiene and the use of pads or adult diapers that are changed regularly will help prevent odors and skin damage.  Avoid caffeine. It can overstimulate your bladder.  Use the bathroom regularly. Try about every 2-3 hours to go to the bathroom, even if you do not feel the need to do so. Take time to empty your bladder completely. After urinating, wait a minute. Then try to urinate again.  For causes involving nerve dysfunction, keep a log of the medicines you take and a journal of the times you go to the bathroom. SEEK MEDICAL CARE IF:  You experience worsening of pain instead of improvement in pain after your procedure.  Your incontinence becomes worse instead of better. SEE IMMEDIATE MEDICAL CARE IF:  You experience fever or shaking chills.  You are unable to pass your urine.  You have redness spreading into your groin or down into your thighs. MAKE SURE   YOU:   Understand these instructions.   Will watch your condition.  Will get help right away if you are not doing well or get worse. Document Released: 03/01/2004 Document Revised: 11/12/2012 Document  Reviewed: 07/01/2012 [facility] Patient Information 2015 [place], [place]. This information is not intended to replace advice given to you by your health care provider. Make sure you discuss any questions you have with your health care provider. Urinary Tract Infection Urinary tract infections (UTIs) can develop anywhere along your urinary tract. Your urinary tract is your body's drainage system for removing wastes and extra water. Your urinary tract includes two kidneys, two ureters, a bladder, and a urethra. Your kidneys are a pair of bean-shaped organs. Each kidney is about the size of your fist. They are located below your ribs, one on each side of your spine. CAUSES Infections are caused by microbes, which are microscopic organisms, including fungi, viruses, and bacteria. These organisms are so small that they can only be seen through a microscope. Bacteria are the microbes that most commonly cause UTIs. SYMPTOMS  Symptoms of UTIs may vary by age and gender of the patient and by the location of the infection. Symptoms in young women typically include a frequent and intense urge to urinate and a painful, burning feeling in the bladder or urethra during urination. Older women and men are more likely to be tired, shaky, and weak and have muscle aches and abdominal pain. A fever may mean the infection is in your kidneys. Other symptoms of a kidney infection include pain in your back or sides below the ribs, nausea, and vomiting. DIAGNOSIS To diagnose a UTI, your caregiver will ask you about your symptoms. Your caregiver also will ask to provide a urine sample. The urine sample will be tested for bacteria and white blood cells. White blood cells are made by your body to help fight infection. TREATMENT  Typically, UTIs can be treated with medication. Because most UTIs are caused by a bacterial infection, they usually can be treated with the use of antibiotics. The choice of antibiotic and length of treatment  depend on your symptoms and the type of bacteria causing your infection. HOME CARE INSTRUCTIONS  If you were prescribed antibiotics, take them exactly as your caregiver instructs you. Finish the medication even if you feel better after you have only taken some of the medication.  Drink enough water and fluids to keep your urine clear or pale yellow.  Avoid caffeine, tea, and carbonated beverages. They tend to irritate your bladder.  Empty your bladder often. Avoid holding urine for long periods of time.  Empty your bladder before and after sexual intercourse.  After a bowel movement, women should cleanse from front to back. Use each tissue only once. SEEK MEDICAL CARE IF:   You have back pain.  You develop a fever.  Your symptoms do not begin to resolve within 3 days. SEEK IMMEDIATE MEDICAL CARE IF:   You have severe back pain or lower abdominal pain.  You develop chills.  You have nausea or vomiting.  You have continued burning or discomfort with urination. MAKE SURE YOU:   Understand these instructions.  Will watch your condition.  Will get help right away if you are not doing well or get worse. Document Released: 11/01/2004 Document Revised: 07/24/2011 Document Reviewed: 03/02/2011 [facility] Patient Information 2015 [place], [place]. This information is not intended to replace advice given to you by your health care provider. Make sure you discuss any questions you have  with your health care provider. Take cipro 1 bid x 7 days Take pyridium  Push water  Decrease caffeine

## 2013-10-29 NOTE — Telephone Encounter (Signed)
Pt states that she has had the urodynamics and now needs to be scheduled for surgery. Pt states that she was told by Dr.Eure that he would not put the sling in anyway because she smokes. Pt states that everything in the visit went back to smoking. Pt states that she is having burning with urination. Pt states that she is having chills and her lower back is hurting.   Pt was advised that we would need to see her for the problems that she is having right now and then go from there. Pt verbalized understanding and phone call was switched to front office to make that appointment.

## 2013-10-29 NOTE — Progress Notes (Signed)
Subjective:     Patient ID: Melissa Garcia, female   DOB: 01/26/1961, 53 y.o.   MRN: [id number]  HPI Melissa Garcia is a 53 year old white female in complaining of burning  with urination on and off since urodynamics, but worse in last 24 hours and she has pressure and some low back pain.Was supposed to get hysterectomy and ?sling, but was told no to sling.  Review of Systems See HPI Reviewed past medical,surgical, social and family history. Reviewed medications and allergies.     Objective:   Physical Exam BP 148/76  Ht 5\' 3"  (1.6 m)  Wt 157 lb (71.215 kg)  BMI 27.82 kg/m2  LMP 08/31/2010   urine 1+ leuks and trace blood, Skin warm and dry.Pelvic: external genitalia is normal in appearance,no lesions, vagina: has loss of color, moisture and rugae, cervix:smooth and bulbous, uterus: normal size, shape and contour, non tender, no masses felt, adnexa: no masses or tenderness noted.Tender over bladder and No CVAT. Has used tampons before and that helps with UI.discussed getting her back in to see Dr Glo Herring and she agrees.  Assessment:     Burning with urination Hematuria UTI Mixed UI    Plan:     Rx cipro 500 mg 1 bid x 7 days Rx pyridium 200 mg 1 tid x 3 days #10 no refills Increase water  Decrease caffeine Return in 1 week to talk with Dr Glo Herring about surgery,(?hysterectomy then try pessary for UI issues) Review handout on UTI and UI

## 2013-10-30 LAB — URINALYSIS
Bilirubin Urine: NEGATIVE
GLUCOSE, UA: NEGATIVE mg/dL
HGB URINE DIPSTICK: NEGATIVE
KETONES UR: NEGATIVE mg/dL
Nitrite: NEGATIVE
PH: 6 (ref 5.0–8.0)
Protein, ur: NEGATIVE mg/dL
Specific Gravity, Urine: 1.014 (ref 1.005–1.030)
Urobilinogen, UA: 0.2 mg/dL (ref 0.0–1.0)

## 2013-10-31 ENCOUNTER — Emergency Department (HOSPITAL_COMMUNITY)
Admission: EM | Admit: 2013-10-31 | Discharge: 2013-11-01 | Disposition: A | Discharge status: Home / Self Care | Payer: Medicaid Other | Attending: Emergency Medicine | Admitting: Emergency Medicine

## 2013-10-31 ENCOUNTER — Encounter (HOSPITAL_COMMUNITY): Payer: Self-pay | Admitting: Emergency Medicine

## 2013-10-31 DIAGNOSIS — F17200 Nicotine dependence, unspecified, uncomplicated: Secondary | ICD-10-CM | POA: Insufficient documentation

## 2013-10-31 DIAGNOSIS — Z9861 Coronary angioplasty status: Secondary | ICD-10-CM | POA: Diagnosis not present

## 2013-10-31 DIAGNOSIS — R112 Nausea with vomiting, unspecified: Secondary | ICD-10-CM

## 2013-10-31 DIAGNOSIS — R509 Fever, unspecified: Secondary | ICD-10-CM | POA: Insufficient documentation

## 2013-10-31 DIAGNOSIS — I10 Essential (primary) hypertension: Secondary | ICD-10-CM | POA: Diagnosis not present

## 2013-10-31 DIAGNOSIS — I2510 Atherosclerotic heart disease of native coronary artery without angina pectoris: Secondary | ICD-10-CM | POA: Diagnosis not present

## 2013-10-31 DIAGNOSIS — J441 Chronic obstructive pulmonary disease with (acute) exacerbation: Secondary | ICD-10-CM | POA: Diagnosis not present

## 2013-10-31 DIAGNOSIS — R079 Chest pain, unspecified: Secondary | ICD-10-CM | POA: Diagnosis not present

## 2013-10-31 DIAGNOSIS — Z8742 Personal history of other diseases of the female genital tract: Secondary | ICD-10-CM | POA: Insufficient documentation

## 2013-10-31 DIAGNOSIS — Z7982 Long term (current) use of aspirin: Secondary | ICD-10-CM | POA: Diagnosis not present

## 2013-10-31 DIAGNOSIS — R51 Headache: Secondary | ICD-10-CM | POA: Diagnosis present

## 2013-10-31 DIAGNOSIS — IMO0002 Reserved for concepts with insufficient information to code with codable children: Secondary | ICD-10-CM | POA: Insufficient documentation

## 2013-10-31 DIAGNOSIS — N390 Urinary tract infection, site not specified: Secondary | ICD-10-CM | POA: Insufficient documentation

## 2013-10-31 DIAGNOSIS — Z79899 Other long term (current) drug therapy: Secondary | ICD-10-CM | POA: Diagnosis not present

## 2013-10-31 DIAGNOSIS — F319 Bipolar disorder, unspecified: Secondary | ICD-10-CM | POA: Insufficient documentation

## 2013-10-31 DIAGNOSIS — I252 Old myocardial infarction: Secondary | ICD-10-CM | POA: Insufficient documentation

## 2013-10-31 LAB — URINE CULTURE

## 2013-10-31 LAB — URINALYSIS, ROUTINE W REFLEX MICROSCOPIC
BILIRUBIN URINE: NEGATIVE
GLUCOSE, UA: NEGATIVE mg/dL
HGB URINE DIPSTICK: NEGATIVE
KETONES UR: NEGATIVE mg/dL
Leukocytes, UA: NEGATIVE
Nitrite: NEGATIVE
PH: 6.5 (ref 5.0–8.0)
Protein, ur: NEGATIVE mg/dL
Urobilinogen, UA: 0.2 mg/dL (ref 0.0–1.0)

## 2013-10-31 LAB — BASIC METABOLIC PANEL
Anion gap: 11 (ref 5–15)
BUN: 9 mg/dL (ref 6–23)
CO2: 27 mEq/L (ref 19–32)
CREATININE: 1.12 mg/dL — AB (ref 0.50–1.10)
Calcium: 9.5 mg/dL (ref 8.4–10.5)
Chloride: 100 mEq/L (ref 96–112)
GFR, EST AFRICAN AMERICAN: 63 mL/min — AB (ref >90)
GFR, EST NON AFRICAN AMERICAN: 54 mL/min — AB (ref >90)
Glucose, Bld: 110 mg/dL — ABNORMAL HIGH (ref 70–99)
Potassium: 3.8 mEq/L (ref 3.7–5.3)
Sodium: 138 mEq/L (ref 137–147)

## 2013-10-31 LAB — CBC WITH DIFFERENTIAL/PLATELET
BASOS PCT: 0 % (ref 0–1)
Basophils Absolute: 0 10*3/uL (ref 0.0–0.1)
EOS ABS: 0.2 10*3/uL (ref 0.0–0.7)
EOS PCT: 2 % (ref 0–5)
HEMATOCRIT: 40.9 % (ref 36.0–46.0)
HEMOGLOBIN: 14.5 g/dL (ref 12.0–15.0)
Lymphocytes Relative: 14 % (ref 12–46)
Lymphs Abs: 1.4 10*3/uL (ref 0.7–4.0)
MCH: 34.2 pg — AB (ref 26.0–34.0)
MCHC: 35.5 g/dL (ref 30.0–36.0)
MCV: 96.5 fL (ref 78.0–100.0)
Monocytes Absolute: 0.9 10*3/uL (ref 0.1–1.0)
Monocytes Relative: 9 % (ref 3–12)
Neutro Abs: 7.4 10*3/uL (ref 1.7–7.7)
Neutrophils Relative %: 75 % (ref 43–77)
Platelets: 269 10*3/uL (ref 150–400)
RBC: 4.24 MIL/uL (ref 3.87–5.11)
RDW: 12.8 % (ref 11.5–15.5)
WBC: 9.9 10*3/uL (ref 4.0–10.5)

## 2013-10-31 LAB — TROPONIN I: Troponin I: <0.3 ng/mL (ref <0.30)

## 2013-10-31 MED ORDER — ONDANSETRON HCL 4 MG/2ML IJ SOLN
4.0000 mg | Freq: Once | INTRAMUSCULAR | Status: AC
  Administered 2013-10-31: 4 mg via INTRAVENOUS

## 2013-10-31 MED ORDER — ONDANSETRON HCL 4 MG/2ML IJ SOLN
INTRAMUSCULAR | Status: AC
  Filled 2013-10-31: qty 2

## 2013-10-31 NOTE — ED Notes (Signed)
Pt states she started taking Cipro Thursday for a urinary tract infection. Pt c/o lower back pain, headache, and nausea.

## 2013-10-31 NOTE — ED Notes (Signed)
Vomited semidigested food, moaning with abd pain

## 2013-10-31 NOTE — ED Notes (Addendum)
States she also vomited enroute to ED

## 2013-11-01 MED ORDER — FENTANYL CITRATE 0.05 MG/ML IJ SOLN
75.0000 ug | Freq: Once | INTRAMUSCULAR | Status: AC
  Administered 2013-11-01: 75 ug via INTRAVENOUS
  Filled 2013-11-01: qty 2

## 2013-11-01 MED ORDER — ASPIRIN 81 MG PO CHEW
324.0000 mg | CHEWABLE_TABLET | Freq: Once | ORAL | Status: AC
  Administered 2013-11-01: 324 mg via ORAL
  Filled 2013-11-01: qty 4

## 2013-11-01 MED ORDER — SODIUM CHLORIDE 0.9 % IV BOLUS (SEPSIS)
1000.0000 mL | Freq: Once | INTRAVENOUS | Status: AC
  Administered 2013-11-01: 1000 mL via INTRAVENOUS

## 2013-11-01 MED ORDER — IPRATROPIUM-ALBUTEROL 0.5-2.5 (3) MG/3ML IN SOLN
3.0000 mL | Freq: Once | RESPIRATORY_TRACT | Status: AC
  Administered 2013-11-01: 3 mL via RESPIRATORY_TRACT
  Filled 2013-11-01: qty 3

## 2013-11-01 MED ORDER — DEXTROSE 5 % IV SOLN
1.0000 g | Freq: Once | INTRAVENOUS | Status: AC
  Administered 2013-11-01: 1 g via INTRAVENOUS
  Filled 2013-11-01: qty 10

## 2013-11-01 MED ORDER — ONDANSETRON 4 MG PO TBDP: ORAL_TABLET | ORAL | Status: DC

## 2013-11-01 MED ORDER — METOCLOPRAMIDE HCL 5 MG/ML IJ SOLN
10.0000 mg | Freq: Once | INTRAMUSCULAR | Status: AC
  Administered 2013-11-01: 10 mg via INTRAVENOUS
  Filled 2013-11-01: qty 2

## 2013-11-01 NOTE — Discharge Instructions (Signed)
If you were given medicines take as directed.  If you are on coumadin or contraceptives realize their levels and effectiveness is altered by many different medicines.  If you have any reaction (rash, tongues swelling, other) to the medicines stop taking and see a physician. Follow up with your scheduled stress test as previously arranged. If you develop recurrent chest pain come back to the ER. If you develop weakness or numbness in your legs, bowel or bladder changes, persistent fevers make sure you see a physician. Finish antibiotics from your recent UTI.   Please follow up as directed and return to the ER or see a physician for new or worsening symptoms.  Thank you. Filed Vitals:   11/01/13 0053 11/01/13 0100 11/01/13 0130 11/01/13 0147  BP:  130/75 132/84 132/71  Pulse:  80 87 71  Temp:      TempSrc:      Resp:  12 17 14   Height:      Weight:      SpO2: 89% 97% 99% 96%

## 2013-11-01 NOTE — ED Provider Notes (Signed)
CSN: [id number]     Arrival date & time 10/31/13  2150 History   First MD Initiated Contact with Patient 10/31/13 2304    This chart was scribed for Mariea Clonts, MD by Terressa Koyanagi, ED Scribe. This patient was seen in room [facility]18[facility]18 and the patient's care was started at 12:12 AM.  Chief Complaint  Patient presents with  . Headache   The history is provided by the patient. No language interpreter was used.   PCP: [name], PA-C HPI Comments: Melissa Garcia is a 53 y.o. female, with medical Hx noted below significant for COPD, stent placement (2012), MI, tobacco use (0.5 ppd) and essential HTN, who presents to the Emergency Department complaining of constant, gradual worsening HA onset this morning. Pt also complains of associated: (1) n/v onset today and fever; and (2) chills onset yesterday. Pt further notes that: (1) she had back pain yesterday that has now resolved and (2) she had a few episodes of chest pain around noon today lasting a few seconds each, however, she notes her chest pain has completely resolved. Pt denies photophobia, neck stiffness, sore throat, diarrhea, chest pain, cough, abd pain. Pt was started on antibiotics 2 days ago for an UTI.   Past Medical History  Diagnosis Date  . Coronary atherosclerosis of native coronary artery     DES LAD and PTCA diagonal 8/10, LVEF 30% 8/10  . Myocardial infarct     AMI 8/10  . Essential hypertension, benign   . Urinary incontinence   . Bipolar disorder   . COPD (chronic obstructive pulmonary disease)   . Eczema   . Stress incontinence   . Fibroids   . Burning with urination 10/29/2013  . Urinary incontinence, mixed 10/29/2013  . Hematuria 10/29/2013  . UTI (lower urinary tract infection) 10/29/2013   Past Surgical History  Procedure Laterality Date  . Appendectomy    . Tubal ligation    . Coronary stent placement      heart stent  . Cataract extraction w/phaco Left 01/26/2013    Procedure: LEFT EYE CATARACT  EXTRACTION PHACO AND INTRAOCULAR LENS PLACEMENT  CDE=6.68;  Surgeon: Tonny Branch, MD;  Location: [facility];  Service: Ophthalmology;  Laterality: Left;  Marland Kitchen Eye surgery Bilateral 01/2013    cataracts  . Cataract extraction w/phaco Right 01/08/2013    Procedure: CATARACT EXTRACTION PHACO AND INTRAOCULAR LENS PLACEMENT (IOC);  Surgeon: Tonny Branch, MD;  Location: [facility];  Service: Ophthalmology;  Laterality: Right;  CDE 9.04   Family History  Problem Relation Age of Onset  . Cancer Father     lung  . Cancer Mother     skin  . Stroke Mother   . Hypertension Mother   . Birth defects Sister     born with hole in her heart   History  Substance Use Topics  . Smoking status: Current Some Day Smoker -- 0.50 packs/day for 30 years    Types: Cigarettes  . Smokeless tobacco: Never Used  . Alcohol Use: No   OB History   Grav Para Term Preterm Abortions TAB SAB Ect Mult Living   5 3 0 0 2 0 2 0 0 3      Review of Systems  Constitutional: Positive for fever and chills.  Eyes: Negative for photophobia.  Cardiovascular: Negative for chest pain.  Gastrointestinal: Positive for nausea and vomiting. Negative for abdominal pain and diarrhea.  Genitourinary: Positive for dysuria.  Musculoskeletal: Negative for neck pain and neck stiffness.  Neurological: Positive for headaches.  Psychiatric/Behavioral: Negative for confusion.  All other systems reviewed and are negative.  Allergies  Codeine; Depakote; Sulfonamide derivatives; and Toviaz  Home Medications   Prior to Admission medications   Medication Sig Start Date End Date Taking? Authorizing Provider  albuterol (PROVENTIL HFA;VENTOLIN HFA) 108 (90 BASE) MCG/ACT inhaler Inhale 2 puffs into the lungs every 4 (four) hours as needed for wheezing. 08/28/12  Yes Susy Frizzle, MD  aspirin EC 81 MG tablet Take 81 mg by mouth daily.     Yes Historical Provider, MD  cetirizine (ZYRTEC) 10 MG tablet Take 10 mg by mouth daily. 09/06/13  Yes Billy Fischer, MD   ciprofloxacin (CIPRO) 500 MG tablet Take 1 tablet (500 mg total) by mouth 2 (two) times daily. 10/29/13  Yes Estill Dooms, NP  Clobetasol Prop Emollient Base (CLOBETASOL PROPIONATE E) 0.05 % emollient cream Apply 1 application topically 2 (two) times daily as needed (Rash). To back for rash   Yes Historical Provider, MD  Omega-3 Fatty Acids (FISH OIL PO) Take 1 capsule by mouth daily.    Yes Historical Provider, MD  ranitidine (ZANTAC) 75 MG tablet Take 75 mg by mouth daily as needed for heartburn.    Yes Historical Provider, MD  sertraline (ZOLOFT) 50 MG tablet Take 1 tablet (50 mg total) by mouth daily. 06/01/13  Yes Mary B Dixon, PA-C  budesonide-formoterol (SYMBICORT) 160-4.5 MCG/ACT inhaler Inhale 2 puffs into the lungs 2 (two) times daily as needed (Shortness Of Breath).     Historical Provider, MD  nitroGLYCERIN (NITROSTAT) 0.4 MG SL tablet Place 1 tablet (0.4 mg total) under the tongue every 5 (five) minutes as needed for chest pain. 02/04/13   Satira Sark, MD   Triage Vitals: BP 137/75  Pulse 89  Temp(Src) 100 F (37.8 C) (Rectal)  Resp 18  Ht 5\' 3"  (1.6 m)  Wt 155 lb (70.308 kg)  BMI 27.46 kg/m2  SpO2 98%  LMP 08/31/2010 Physical Exam  Nursing note and vitals reviewed. Constitutional: She is oriented to person, place, and time. She appears well-developed and well-nourished. No distress.  HENT:  Head: Normocephalic and atraumatic.  Dry mucous membrane  Eyes: Conjunctivae and EOM are normal. Pupils are equal, round, and reactive to light.  Visual fields intact  Neck: Normal range of motion. Neck supple. No tracheal deviation present.  Cardiovascular: Normal rate.   Pulmonary/Chest: Effort normal. No respiratory distress. She has wheezes (mild expiratory wheezes bilateral ).  Abdominal: Soft. There is no tenderness. There is no guarding.  No focal flank pain   Musculoskeletal: Normal range of motion.  Neurological: She is alert and oriented to person, place, and  time. GCS eye subscore is 4. GCS verbal subscore is 5. GCS motor subscore is 6.  5+ strength in UE and LE with f/e at major joints. Sensation to palpation intact in UE and LE. CNs 2-12 grossly intact.  EOMFI.  PERRL.   Finger nose and coordination intact bilateral.   Visual fields intact to finger testing.   Skin: Skin is warm and dry.  Psychiatric: She has a normal mood and affect. Her behavior is normal.    ED Course  Procedures (including critical care time) DIAGNOSTIC STUDIES: Oxygen Saturation is 98% on RA, nl by my interpretation.    COORDINATION OF CARE: 12:24 AM-Discussed treatment plan which includes meds, labs and EKG with pt at bedside and pt agreed to plan. Pt was also advised to quit smoking.  Labs Review Labs Reviewed  URINALYSIS, ROUTINE W REFLEX MICROSCOPIC - Abnormal; Notable for the following:    Specific Gravity, Urine <1.005 (*)    All other components within normal limits  CBC WITH DIFFERENTIAL - Abnormal; Notable for the following:    MCH 34.2 (*)    All other components within normal limits  BASIC METABOLIC PANEL - Abnormal; Notable for the following:    Glucose, Bld 110 (*)    Creatinine, Ser 1.12 (*)    GFR calc non Af Amer 54 (*)    GFR calc Af Amer 63 (*)    All other components within normal limits  URINE CULTURE  TROPONIN I    Imaging Review No results found.   EKG Interpretation   Date/Time:  Saturday October 31 2013 22:40:28 EDT Ventricular Rate:  83 PR Interval:  159 QRS Duration: 83 QT Interval:  391 QTC Calculation: 459 R Axis:   55 Text Interpretation:  Sinus rhythm Confirmed by Holman Bonsignore  MD, Keiron Iodice ([id number])  on 11/01/2013 12:18:35 AM      MDM   Final diagnoses:  Chest pain, unspecified chest pain type  UTI (lower urinary tract infection)  Non-intractable vomiting with nausea, vomiting of unspecified type   I personally performed the services described in this documentation, which was scribed in my presence. The recorded  information has been reviewed and is accurate.  Patient presented to the emergency department with multiple different symptoms. Clinically I felt most likely all her symptoms may be related to her recent urine infection as she had gradual onset back pain, low-grade fever and headache. Her urinalysis was overall unremarkable, IV Rocephin given as patient has been vomiting and was unable to keep her oral antibiotics down.  Neb given for mild wheezing.    Patient had very brief episode of chest pain lasting a few seconds around noon today. Patient does have CAD history however the chest pain very atypical. No exertional or diaphoretic component. Patient has not had any chest pain or shortness of breath since noon. Patient has outpatient stress test arranged. I discussed observation versus close outpatient followup and patient prefers outpatient followup. Patient understands she can return for any recurrent symptoms. Primary reason for ER visit was gradual onset headache, similar to previous headaches she said in the past, normal neuro exam, headache resolved in the ER with treatment. I do not feel an emergent CT scan of the head was indicated today. No signs of meningitis.  Patient had mild lower back pain earlier today that resolved, normal neuro exam.  Results and differential diagnosis were discussed with the patient/parent/guardian. Close follow up outpatient was discussed, comfortable with the plan.   Medications  ondansetron (ZOFRAN) injection 4 mg (4 mg Intravenous Given 10/31/13 2310)  metoCLOPramide (REGLAN) injection 10 mg (10 mg Intravenous Given 11/01/13 0042)  sodium chloride 0.9 % bolus 1,000 mL (0 mLs Intravenous Stopped 11/01/13 0145)  cefTRIAXone (ROCEPHIN) 1 g in dextrose 5 % 50 mL IVPB (0 g Intravenous Stopped 11/01/13 0125)  fentaNYL (SUBLIMAZE) injection 75 mcg (75 mcg Intravenous Given 11/01/13 0042)  aspirin chewable tablet 324 mg (324 mg Oral Given 11/01/13 0043)   ipratropium-albuterol (DUONEB) 0.5-2.5 (3) MG/3ML nebulizer solution 3 mL (3 mLs Nebulization Given 11/01/13 0052)    Filed Vitals:   11/01/13 0053 11/01/13 0100 11/01/13 0130 11/01/13 0147  BP:  130/75 132/84 132/71  Pulse:  80 87 71  Temp:      TempSrc:      Resp:  12  17 14  Height:      Weight:      SpO2: 89% 97% 99% 96%    Final diagnoses:  Chest pain, unspecified chest pain type  UTI (lower urinary tract infection)  Non-intractable vomiting with nausea, vomiting of unspecified type        Mariea Clonts, MD 11/01/13 [phone]

## 2013-11-02 ENCOUNTER — Telehealth: Payer: Self-pay | Admitting: Adult Health

## 2013-11-02 ENCOUNTER — Emergency Department (HOSPITAL_COMMUNITY)
Admission: EM | Admit: 2013-11-02 | Discharge: 2013-11-02 | Disposition: A | Discharge status: Home / Self Care | Payer: Medicaid Other | Attending: Emergency Medicine | Admitting: Emergency Medicine

## 2013-11-02 ENCOUNTER — Encounter (HOSPITAL_COMMUNITY): Payer: Self-pay | Admitting: Emergency Medicine

## 2013-11-02 ENCOUNTER — Emergency Department (HOSPITAL_COMMUNITY): Payer: Medicaid Other

## 2013-11-02 DIAGNOSIS — Z79899 Other long term (current) drug therapy: Secondary | ICD-10-CM | POA: Insufficient documentation

## 2013-11-02 DIAGNOSIS — Z951 Presence of aortocoronary bypass graft: Secondary | ICD-10-CM | POA: Diagnosis not present

## 2013-11-02 DIAGNOSIS — Z9089 Acquired absence of other organs: Secondary | ICD-10-CM | POA: Diagnosis not present

## 2013-11-02 DIAGNOSIS — Z87448 Personal history of other diseases of urinary system: Secondary | ICD-10-CM | POA: Insufficient documentation

## 2013-11-02 DIAGNOSIS — I10 Essential (primary) hypertension: Secondary | ICD-10-CM | POA: Diagnosis not present

## 2013-11-02 DIAGNOSIS — I252 Old myocardial infarction: Secondary | ICD-10-CM | POA: Insufficient documentation

## 2013-11-02 DIAGNOSIS — K5289 Other specified noninfective gastroenteritis and colitis: Secondary | ICD-10-CM | POA: Diagnosis not present

## 2013-11-02 DIAGNOSIS — Z872 Personal history of diseases of the skin and subcutaneous tissue: Secondary | ICD-10-CM | POA: Insufficient documentation

## 2013-11-02 DIAGNOSIS — Z7982 Long term (current) use of aspirin: Secondary | ICD-10-CM | POA: Insufficient documentation

## 2013-11-02 DIAGNOSIS — Z87440 Personal history of urinary (tract) infections: Secondary | ICD-10-CM | POA: Insufficient documentation

## 2013-11-02 DIAGNOSIS — I2510 Atherosclerotic heart disease of native coronary artery without angina pectoris: Secondary | ICD-10-CM | POA: Insufficient documentation

## 2013-11-02 DIAGNOSIS — Z792 Long term (current) use of antibiotics: Secondary | ICD-10-CM | POA: Diagnosis not present

## 2013-11-02 DIAGNOSIS — R109 Unspecified abdominal pain: Secondary | ICD-10-CM | POA: Diagnosis present

## 2013-11-02 DIAGNOSIS — J449 Chronic obstructive pulmonary disease, unspecified: Secondary | ICD-10-CM | POA: Diagnosis not present

## 2013-11-02 DIAGNOSIS — F17200 Nicotine dependence, unspecified, uncomplicated: Secondary | ICD-10-CM | POA: Diagnosis not present

## 2013-11-02 DIAGNOSIS — F319 Bipolar disorder, unspecified: Secondary | ICD-10-CM | POA: Insufficient documentation

## 2013-11-02 DIAGNOSIS — J4489 Other specified chronic obstructive pulmonary disease: Secondary | ICD-10-CM | POA: Insufficient documentation

## 2013-11-02 DIAGNOSIS — K529 Noninfective gastroenteritis and colitis, unspecified: Secondary | ICD-10-CM

## 2013-11-02 LAB — URINALYSIS, ROUTINE W REFLEX MICROSCOPIC
Bilirubin Urine: NEGATIVE
Glucose, UA: NEGATIVE mg/dL
Hgb urine dipstick: NEGATIVE
Ketones, ur: NEGATIVE mg/dL
LEUKOCYTES UA: NEGATIVE
NITRITE: NEGATIVE
PH: 7 (ref 5.0–8.0)
Protein, ur: NEGATIVE mg/dL
SPECIFIC GRAVITY, URINE: 1.01 (ref 1.005–1.030)
Urobilinogen, UA: 0.2 mg/dL (ref 0.0–1.0)

## 2013-11-02 LAB — BASIC METABOLIC PANEL
Anion gap: 13 (ref 5–15)
BUN: 7 mg/dL (ref 6–23)
CHLORIDE: 100 meq/L (ref 96–112)
CO2: 26 meq/L (ref 19–32)
CREATININE: 0.84 mg/dL (ref 0.50–1.10)
Calcium: 9.1 mg/dL (ref 8.4–10.5)
GFR calc Af Amer: 89 mL/min — ABNORMAL LOW (ref >90)
GFR calc non Af Amer: 77 mL/min — ABNORMAL LOW (ref >90)
GLUCOSE: 118 mg/dL — AB (ref 70–99)
POTASSIUM: 4.1 meq/L (ref 3.7–5.3)
Sodium: 139 mEq/L (ref 137–147)

## 2013-11-02 LAB — CBC WITH DIFFERENTIAL/PLATELET
Basophils Absolute: 0 10*3/uL (ref 0.0–0.1)
Basophils Relative: 0 % (ref 0–1)
Eosinophils Absolute: 0.1 10*3/uL (ref 0.0–0.7)
Eosinophils Relative: 1 % (ref 0–5)
HEMATOCRIT: 38.4 % (ref 36.0–46.0)
HEMOGLOBIN: 13.5 g/dL (ref 12.0–15.0)
LYMPHS ABS: 0.4 10*3/uL — AB (ref 0.7–4.0)
LYMPHS PCT: 4 % — AB (ref 12–46)
MCH: 34 pg (ref 26.0–34.0)
MCHC: 35.2 g/dL (ref 30.0–36.0)
MCV: 96.7 fL (ref 78.0–100.0)
MONO ABS: 0.3 10*3/uL (ref 0.1–1.0)
MONOS PCT: 4 % (ref 3–12)
NEUTROS ABS: 8.3 10*3/uL — AB (ref 1.7–7.7)
NEUTROS PCT: 91 % — AB (ref 43–77)
Platelets: 237 10*3/uL (ref 150–400)
RBC: 3.97 MIL/uL (ref 3.87–5.11)
RDW: 12.7 % (ref 11.5–15.5)
WBC: 9.1 10*3/uL (ref 4.0–10.5)

## 2013-11-02 LAB — RAPID URINE DRUG SCREEN, HOSP PERFORMED
Amphetamines: NOT DETECTED
Barbiturates: NOT DETECTED
Benzodiazepines: NOT DETECTED
COCAINE: NOT DETECTED
Opiates: NOT DETECTED
Tetrahydrocannabinol: NOT DETECTED

## 2013-11-02 MED ORDER — METRONIDAZOLE 500 MG PO TABS: 500.0000 mg | ORAL_TABLET | Freq: Three times a day (TID) | ORAL | Status: DC

## 2013-11-02 MED ORDER — PROMETHAZINE HCL 25 MG PO TABS: 25.0000 mg | ORAL_TABLET | Freq: Four times a day (QID) | ORAL | Status: DC | PRN

## 2013-11-02 MED ORDER — IOHEXOL 300 MG/ML  SOLN
100.0000 mL | Freq: Once | INTRAMUSCULAR | Status: AC | PRN
  Administered 2013-11-02: 100 mL via INTRAVENOUS

## 2013-11-02 MED ORDER — METRONIDAZOLE 500 MG PO TABS
500.0000 mg | ORAL_TABLET | Freq: Once | ORAL | Status: AC
  Administered 2013-11-02: 500 mg via ORAL
  Filled 2013-11-02: qty 1

## 2013-11-02 MED ORDER — SODIUM CHLORIDE 0.9 % IV BOLUS (SEPSIS)
1000.0000 mL | Freq: Once | INTRAVENOUS | Status: AC
  Administered 2013-11-02: 1000 mL via INTRAVENOUS

## 2013-11-02 MED ORDER — CIPROFLOXACIN HCL 250 MG PO TABS
500.0000 mg | ORAL_TABLET | Freq: Once | ORAL | Status: AC
  Administered 2013-11-02: 500 mg via ORAL
  Filled 2013-11-02: qty 2

## 2013-11-02 MED ORDER — ONDANSETRON HCL 4 MG/2ML IJ SOLN
4.0000 mg | Freq: Once | INTRAMUSCULAR | Status: AC
  Administered 2013-11-02: 4 mg via INTRAVENOUS
  Filled 2013-11-02: qty 2

## 2013-11-02 NOTE — Telephone Encounter (Signed)
Was seen in ER for colitis, and was given flagyl, finish cipro

## 2013-11-02 NOTE — Discharge Instructions (Signed)

## 2013-11-02 NOTE — ED Provider Notes (Signed)
CSN: [id number]     Arrival date & time 11/02/13  0244 History   First MD Initiated Contact with Patient 11/02/13 [phone]     Chief Complaint  Patient presents with  . Abdominal Pain     (Consider location/radiation/quality/duration/timing/severity/associated sxs/prior Treatment) HPI Is a 53 year old female who was seen here 2 days ago for chills, nausea and vomiting, back plan which resolved and dysuria. She had been started on an antibiotic by her primary care physician after a urinalysis showed a UTI. She was given Rocephin in the ED and discharged home. She has not been compliant with her Cipro subsequently. She returns with what she describes as severe suprapubic pain and a feeling that she needs to move her bowels. She is no longer having back pain. She states she has vomited until "there is nothing left in me" and has a dry mouth. She also complains of chronic discomfort in her left ear that has been present for months. Pain is worse with palpation.  She states she had been feeling fine until she took an ibuprofen gelcap 2 days ago which triggered the nausea and vomiting.  Past Medical History  Diagnosis Date  . Coronary atherosclerosis of native coronary artery     DES LAD and PTCA diagonal 8/10, LVEF 30% 8/10  . Myocardial infarct     AMI 8/10  . Essential hypertension, benign   . Urinary incontinence   . Bipolar disorder   . COPD (chronic obstructive pulmonary disease)   . Eczema   . Stress incontinence   . Fibroids   . Burning with urination 10/29/2013  . Urinary incontinence, mixed 10/29/2013  . Hematuria 10/29/2013  . UTI (lower urinary tract infection) 10/29/2013   Past Surgical History  Procedure Laterality Date  . Appendectomy    . Tubal ligation    . Coronary stent placement      heart stent  . Cataract extraction w/phaco Left 01/26/2013    Procedure: LEFT EYE CATARACT EXTRACTION PHACO AND INTRAOCULAR LENS PLACEMENT  CDE=6.68;  Surgeon: Tonny Branch, MD;  Location: [facility];  Service: Ophthalmology;  Laterality: Left;  Marland Kitchen Eye surgery Bilateral 01/2013    cataracts  . Cataract extraction w/phaco Right 01/08/2013    Procedure: CATARACT EXTRACTION PHACO AND INTRAOCULAR LENS PLACEMENT (IOC);  Surgeon: Tonny Branch, MD;  Location: [facility];  Service: Ophthalmology;  Laterality: Right;  CDE 9.04   Family History  Problem Relation Age of Onset  . Cancer Father     lung  . Cancer Mother     skin  . Stroke Mother   . Hypertension Mother   . Birth defects Sister     born with hole in her heart   History  Substance Use Topics  . Smoking status: Current Some Day Smoker -- 0.50 packs/day for 30 years    Types: Cigarettes  . Smokeless tobacco: Never Used  . Alcohol Use: No   OB History   Grav Para Term Preterm Abortions TAB SAB Ect Mult Living   5 3 0 0 2 0 2 0 0 3      Review of Systems  All other systems reviewed and are negative.   Allergies  Codeine; Depakote; Sulfonamide derivatives; and Toviaz  Home Medications   Prior to Admission medications   Medication Sig Start Date End Date Taking? Authorizing Provider  albuterol (PROVENTIL HFA;VENTOLIN HFA) 108 (90 BASE) MCG/ACT inhaler Inhale 2 puffs into the lungs every 4 (four) hours as needed for wheezing. 08/28/12  Yes  Susy Frizzle, MD  aspirin EC 81 MG tablet Take 81 mg by mouth daily.     Yes Historical Provider, MD  budesonide-formoterol (SYMBICORT) 160-4.5 MCG/ACT inhaler Inhale 2 puffs into the lungs 2 (two) times daily as needed (Shortness Of Breath).    Yes Historical Provider, MD  cetirizine (ZYRTEC) 10 MG tablet Take 10 mg by mouth daily. 09/06/13  Yes Billy Fischer, MD  ciprofloxacin (CIPRO) 500 MG tablet Take 1 tablet (500 mg total) by mouth 2 (two) times daily. 10/29/13  Yes Estill Dooms, NP  Clobetasol Prop Emollient Base (CLOBETASOL PROPIONATE E) 0.05 % emollient cream Apply 1 application topically 2 (two) times daily as needed (Rash). To back for rash   Yes Historical Provider, MD   nitroGLYCERIN (NITROSTAT) 0.4 MG SL tablet Place 1 tablet (0.4 mg total) under the tongue every 5 (five) minutes as needed for chest pain. 02/04/13  Yes Satira Sark, MD  Omega-3 Fatty Acids (FISH OIL PO) Take 1 capsule by mouth daily.    Yes Historical Provider, MD  ondansetron (ZOFRAN ODT) 4 MG disintegrating tablet 4mg  ODT q4 hours prn nausea/vomit 11/01/13  Yes Mariea Clonts, MD  ranitidine (ZANTAC) 75 MG tablet Take 75 mg by mouth daily as needed for heartburn.    Yes Historical Provider, MD  sertraline (ZOLOFT) 50 MG tablet Take 1 tablet (50 mg total) by mouth daily. 06/01/13  Yes Mary B Dixon, PA-C   BP 149/79  Pulse 105  Temp(Src) 99.2 F (37.3 C) (Oral)  Resp 20  Ht 5\' 3"  (1.6 m)  Wt 155 lb (70.308 kg)  BMI 27.46 kg/m2  SpO2 95%  LMP 08/31/2010  Physical Exam General: Well-developed, well-nourished female in no acute distress; appearance consistent with age of record HENT: normocephalic; atraumatic Eyes: pupils equal, round and reactive to light; extraocular muscles intact Neck: supple Heart: regular rate and rhythm; tachycardic Lungs: Distant sounds Abdomen: soft; nondistended; low suprapubic tenderness; no masses or hepatosplenomegaly; bowel sounds present Extremities: No deformity; full range of motion; pulses normal Neurologic: Somnolent but arousable; oriented; motor function intact in all extremities and symmetric; no facial droop Skin: Warm and dry Psychiatric: Flat affect    ED Course  Procedures (including critical care time)  MDM  Nursing notes and vitals signs, including pulse oximetry, reviewed.  Summary of this visit's results, reviewed by myself:  Labs:  Results for orders placed during the hospital encounter of 11/02/13 (from the past 24 hour(s))  URINE RAPID DRUG SCREEN ([place])     Status: None   Collection Time    11/02/13  3:06 AM      Result Value Ref Range   Opiates NONE DETECTED  NONE DETECTED   Cocaine NONE DETECTED  NONE  DETECTED   Benzodiazepines NONE DETECTED  NONE DETECTED   Amphetamines NONE DETECTED  NONE DETECTED   Tetrahydrocannabinol NONE DETECTED  NONE DETECTED   Barbiturates NONE DETECTED  NONE DETECTED  URINALYSIS, ROUTINE W REFLEX MICROSCOPIC     Status: None   Collection Time    11/02/13  3:06 AM      Result Value Ref Range   Color, Urine YELLOW  YELLOW   APPearance CLEAR  CLEAR   Specific Gravity, Urine 1.010  1.005 - 1.030   pH 7.0  5.0 - 8.0   Glucose, UA NEGATIVE  NEGATIVE mg/dL   Hgb urine dipstick NEGATIVE  NEGATIVE   Bilirubin Urine NEGATIVE  NEGATIVE   Ketones, ur NEGATIVE  NEGATIVE mg/dL  Protein, ur NEGATIVE  NEGATIVE mg/dL   Urobilinogen, UA 0.2  0.0 - 1.0 mg/dL   Nitrite NEGATIVE  NEGATIVE   Leukocytes, UA NEGATIVE  NEGATIVE  CBC WITH DIFFERENTIAL     Status: Abnormal   Collection Time    11/02/13  3:30 AM      Result Value Ref Range   WBC 9.1  4.0 - 10.5 K/uL   RBC 3.97  3.87 - 5.11 MIL/uL   Hemoglobin 13.5  12.0 - 15.0 g/dL   HCT 38.4  36.0 - 46.0 %   MCV 96.7  78.0 - 100.0 fL   MCH 34.0  26.0 - 34.0 pg   MCHC 35.2  30.0 - 36.0 g/dL   RDW 12.7  11.5 - 15.5 %   Platelets 237  150 - 400 K/uL   Neutrophils Relative % 91 (*) 43 - 77 %   Neutro Abs 8.3 (*) 1.7 - 7.7 K/uL   Lymphocytes Relative 4 (*) 12 - 46 %   Lymphs Abs 0.4 (*) 0.7 - 4.0 K/uL   Monocytes Relative 4  3 - 12 %   Monocytes Absolute 0.3  0.1 - 1.0 K/uL   Eosinophils Relative 1  0 - 5 %   Eosinophils Absolute 0.1  0.0 - 0.7 K/uL   Basophils Relative 0  0 - 1 %   Basophils Absolute 0.0  0.0 - 0.1 K/uL  BASIC METABOLIC PANEL     Status: Abnormal   Collection Time    11/02/13  3:30 AM      Result Value Ref Range   Sodium 139  137 - 147 mEq/L   Potassium 4.1  3.7 - 5.3 mEq/L   Chloride 100  96 - 112 mEq/L   CO2 26  19 - 32 mEq/L   Glucose, Bld 118 (*) 70 - 99 mg/dL   BUN 7  6 - 23 mg/dL   Creatinine, Ser 0.84  0.50 - 1.10 mg/dL   Calcium 9.1  8.4 - 10.5 mg/dL   GFR calc non Af Amer 77 (*) >90  mL/min   GFR calc Af Amer 89 (*) >90 mL/min   Anion gap 13  5 - 15    Imaging Studies: Ct Abdomen Pelvis W Contrast  11/02/2013   CLINICAL DATA:  abdominal pain  EXAM: CT ABDOMEN AND PELVIS WITH CONTRAST  TECHNIQUE: Multidetector CT imaging of the abdomen and pelvis was performed using the standard protocol following bolus administration of intravenous contrast.  CONTRAST:  178mL OMNIPAQUE IOHEXOL 300 MG/ML  SOLN  COMPARISON:  Prior study from 06/30/2013  FINDINGS: Patchy bibasilar atelectasis seen dependently within the visualized lung bases. No pleural or pericardial effusion.  The liver demonstrates a normal contrast enhanced appearance. Gallbladder within normal limits. No biliary dilatation. Spleen is unremarkable. Adrenal glands and pancreas are within normal limits.  Kidneys are equal size with symmetric enhancement. No nephrolithiasis, hydronephrosis, or focal enhancing renal mass.  Stomach is within normal limits. No evidence of bowel obstruction. There is mild circumferential wall thickening with stranding about the mid descending colon in the left abdomen, which may reflect mild colitis. No evidence of perforation. Appendix not visualized, compatible with history prior appendectomy.  Bladder within normal limits.  Uterus and ovaries are unremarkable.  No free air or fluid. No adenopathy. Moderate aorto bi-iliac atherosclerotic calcifications present. No intra-abdominal aneurysm.  No acute osseous abnormality. Prominent bilateral facet arthrosis present at L4-5 and L5-S1. No worrisome lytic or blastic osseous lesions. Benign bone island noted  within the left femoral head.  IMPRESSION: 1. Mild circumferential wall thickening and stranding about the descending colon in the left mid abdomen, suggesting mild colitis. No evidence of perforation or other complication. 2. No other acute intra-abdominal or pelvic process identified.   Electronically Signed   By: Jeannine Boga M.D.   On: 11/02/2013  05:11   5:17 AM We'll have patient restart Cipro and add Flagyl for treatment of apparent colitis on CT scan.      Wynetta Fines, MD 11/02/13 [phone]

## 2013-11-02 NOTE — Telephone Encounter (Signed)
Spoke with pt. Pt was put on Cipro at our office 10/29/13. On Saturday pm, she started hurting in back and vomiting. Went to [facility] ER. Was told to continue Cipro and was given Rx for Zofran. Last pm, she started vomiting again and hurting in lower stomach. Went back to [facility] ER and she was diagnosed with colitis. She was given Phenergan and Flagyl. Her question is does she continue Cipro? Thanks!!! [facility]

## 2013-11-03 LAB — URINE CULTURE
COLONY COUNT: NO GROWTH
Culture: NO GROWTH

## 2013-11-04 ENCOUNTER — Telehealth: Payer: Self-pay | Admitting: Adult Health

## 2013-11-04 ENCOUNTER — Telehealth (HOSPITAL_COMMUNITY): Payer: Self-pay

## 2013-11-04 NOTE — Telephone Encounter (Signed)
Felt dizzy with meds, hold meds tonight keep appt in am and bring meds with you to office

## 2013-11-04 NOTE — Progress Notes (Signed)
Patient ID: [name], female   DOB: 09-13-1960, 53 y.o.   MRN: [id number] urodyanmics see computer report  Summary:  +ISD with very low intrisic urethral pressure, would require peri urethral bulking agents  Normal uroflow velocity  Minimal PVR  Leakage at very low volumes and very low pressures consistent with stress no evidence of detrusor instability or over active bladder  Impression: ISD Stress incontinence  Unfortunately with pts smoking history, up to 2-3 packs per day, and her objective findings a sling at this age would be of minimal improvement  recommed evaluation for peri urethral bulking, sling would be of limited utility with her level of ISD and very low volume with urinary loss

## 2013-11-04 NOTE — Telephone Encounter (Signed)
Encounter complete. 

## 2013-11-05 ENCOUNTER — Encounter: Payer: Self-pay | Admitting: Obstetrics and Gynecology

## 2013-11-05 ENCOUNTER — Ambulatory Visit (INDEPENDENT_AMBULATORY_CARE_PROVIDER_SITE_OTHER): Payer: Medicaid Other | Admitting: Obstetrics and Gynecology

## 2013-11-05 VITALS — BP 142/90 | Ht 63.0 in | Wt 155.0 lb

## 2013-11-05 DIAGNOSIS — N393 Stress incontinence (female) (male): Secondary | ICD-10-CM

## 2013-11-05 DIAGNOSIS — N840 Polyp of corpus uteri: Secondary | ICD-10-CM

## 2013-11-05 NOTE — Progress Notes (Signed)
Patient ID: Melissa Garcia, female   DOB: Oct 10, 1960, 53 y.o.   MRN: [id number]   [facility] Visit  Patient name: Melissa Garcia MRN [id number]  Date of birth: 07-26-1960  CC & HPI:  Melissa Garcia is a 53 y.o. female presenting today to address associated vomiting and diarrhea with her medication.  She is not sure which medication is making her sick.  She was diagnosed with a UTI on 9/28 at [facility] ED and prescribed Cipro for her symptoms.  She had a UA performed at that time which was normal.  She started taking Metronidazole and experiencing of weakness and syncope.  She has not taken last night's or today's doses of Metronidazole.  She states that she is feeling fine but is constipated.  She has not had a bowel movement since last week.    ROS:  All systems are reivewed and are negative unless otherwise indicated in the HPI.  Pertinent History Reviewed:   Reviewed: Significant for  Medical         Past Medical History  Diagnosis Date  . Coronary atherosclerosis of native coronary artery     DES LAD and PTCA diagonal 8/10, LVEF 30% 8/10  . Myocardial infarct     AMI 8/10  . Essential hypertension, benign   . Urinary incontinence   . Bipolar disorder   . COPD (chronic obstructive pulmonary disease)   . Eczema   . Stress incontinence   . Fibroids   . Burning with urination 10/29/2013  . Urinary incontinence, mixed 10/29/2013  . Hematuria 10/29/2013  . UTI (lower urinary tract infection) 10/29/2013                              Surgical Hx:    Past Surgical History  Procedure Laterality Date  . Appendectomy    . Tubal ligation    . Coronary stent placement      heart stent  . Cataract extraction w/phaco Left 01/26/2013    Procedure: LEFT EYE CATARACT EXTRACTION PHACO AND INTRAOCULAR LENS PLACEMENT  CDE=6.68;  Surgeon: Tonny Branch, MD;  Location: [facility];  Service: Ophthalmology;  Laterality: Left;  [facility] Kitchen Eye surgery Bilateral 01/2013    cataracts  . Cataract extraction  w/phaco Right 01/08/2013    Procedure: CATARACT EXTRACTION PHACO AND INTRAOCULAR LENS PLACEMENT (IOC);  Surgeon: Tonny Branch, MD;  Location: [facility];  Service: Ophthalmology;  Laterality: Right;  CDE 9.04   Medications: Reviewed & Updated - see associated section                      Current outpatient prescriptions:albuterol (PROVENTIL HFA;VENTOLIN HFA) 108 (90 BASE) MCG/ACT inhaler, Inhale 2 puffs into the lungs every 4 (four) hours as needed for wheezing., Disp: , Rfl: ;  aspirin EC 81 MG tablet, Take 81 mg by mouth daily.  , Disp: , Rfl: ;  budesonide-formoterol (SYMBICORT) 160-4.5 MCG/ACT inhaler, Inhale 2 puffs into the lungs 2 (two) times daily as needed (Shortness Of Breath). , Disp: , Rfl:  cetirizine (ZYRTEC) 10 MG tablet, Take 10 mg by mouth daily., Disp: , Rfl: ;  nitroGLYCERIN (NITROSTAT) 0.4 MG SL tablet, Place 1 tablet (0.4 mg total) under the tongue every 5 (five) minutes as needed for chest pain., Disp: 25 tablet, Rfl: 3;  Omega-3 Fatty Acids (FISH OIL PO), Take 1 capsule by mouth daily. , Disp: , Rfl:  promethazine (PHENERGAN)  25 MG tablet, Take 1 tablet (25 mg total) by mouth every 6 (six) hours as needed for nausea or vomiting., Disp: 12 tablet, Rfl: 0;  ranitidine (ZANTAC) 75 MG tablet, Take 75 mg by mouth daily as needed for heartburn. , Disp: , Rfl: ;  sertraline (ZOLOFT) 50 MG tablet, Take 1 tablet (50 mg total) by mouth daily., Disp: 30 tablet, Rfl: 1 ciprofloxacin (CIPRO) 500 MG tablet, Take 1 tablet (500 mg total) by mouth 2 (two) times daily., Disp: 14 tablet, Rfl: 0;  Clobetasol Prop Emollient Base (CLOBETASOL PROPIONATE E) 0.05 % emollient cream, Apply 1 application topically 2 (two) times daily as needed (Rash). To back for rash, Disp: , Rfl: ;  metroNIDAZOLE (FLAGYL) 500 MG tablet, Take 1 tablet (500 mg total) by mouth 3 (three) times daily. One po bid x 7 days, Disp: 21 tablet, Rfl: 0 ondansetron (ZOFRAN ODT) 4 MG disintegrating tablet, 4mg  ODT q4 hours prn nausea/vomit, Disp:  10 tablet, Rfl: 0   Social History: Reviewed -  reports that she has been smoking Cigarettes.  She has a 15 pack-year smoking history. She has never used smokeless tobacco.  Objective Findings:  Vitals: Blood pressure 142/90, height 5\' 3"  (1.6 m), weight 155 lb (70.308 kg), last menstrual period 08/31/2010.  Physical Examination: Lengthy discussion on patient's current symptoms and surgical options to address patient's bladder control.   Assessment & Plan:   A:  1. Constipation 2. Postmenopausal bleeding with endometrial polyp, not yet removed. Benign endometrial biopsy 3. Urinary discomfort s/p, resolved.   P:  1. Reexamine after constipation resolved, to see if pessary fitting might assist with urinary continence 2. Cigarette use is an obstacle to success, pt aware 3. Anxiety component to cig abuse, might consider wellbutrin 4. After incontinence addressed, re-address endometial polyp and schedule hysteroscopy d&C with polypectomy 5. Recheck 1-2 wk  This chart was scribed for Jonnie Kind, MD by Donato Schultz, ED Scribe. This patient was seen in Room 4 and the patient's care was started at 10:30 AM.

## 2013-11-05 NOTE — Patient Instructions (Signed)
Bupropion extended-release tablets (Depression/Mood Disorders) What is this medicine? BUPROPION (byoo PROE pee on) is used to treat depression. This medicine may be used for other purposes; ask your health care provider or pharmacist if you have questions. COMMON BRAND NAME(S): Aplenzin, Budeprion XL, Forfivo XL, Wellbutrin XL What should I tell my health care provider before I take this medicine? They need to know if you have any of these conditions: -an eating disorder, such as anorexia or bulimia -bipolar disorder or psychosis -diabetes or high blood sugar, treated with medication -glaucoma -head injury or brain tumor -heart disease, previous heart attack, or irregular heart beat -high blood pressure -kidney or liver disease -seizures (convulsions) -suicidal thoughts or a previous suicide attempt -Tourette's syndrome -weight loss -an unusual or allergic reaction to bupropion, other medicines, foods, dyes, or preservatives -breast-feeding -pregnant or trying to become pregnant How should I use this medicine? Take this medicine by mouth with a glass of water. Follow the directions on the prescription label. You can take it with or without food. If it upsets your stomach, take it with food. Do not crush, chew, or cut these tablets. This medicine is taken once daily at the same time each day. Do not take your medicine more often than directed. Do not stop taking this medicine suddenly except upon the advice of your doctor. Stopping this medicine too quickly may cause serious side effects or your condition may worsen. A special MedGuide will be given to you by the pharmacist with each prescription and refill. Be sure to read this information carefully each time. Talk to your pediatrician regarding the use of this medicine in children. Special care may be needed. Overdosage: If you think you have taken too much of this medicine contact a poison control center or emergency room at once. NOTE:  This medicine is only for you. Do not share this medicine with others. What if I miss a dose? If you miss a dose, skip the missed dose and take your next tablet at the regular time. Do not take double or extra doses. What may interact with this medicine? Do not take this medicine with any of the following medications: -linezolid -MAOIs like Azilect, Carbex, Eldepryl, Marplan, Nardil, and Parnate -methylene blue (injected into a vein) -other medicines that contain bupropion like Zyban This medicine may also interact with the following medications: -alcohol -certain medicines for anxiety or sleep -certain medicines for blood pressure like metoprolol, propranolol -certain medicines for depression or psychotic disturbances -certain medicines for HIV or AIDS like efavirenz, lopinavir, nelfinavir, ritonavir -certain medicines for irregular heart beat like propafenone, flecainide -certain medicines for Parkinson's disease like amantadine, levodopa -certain medicines for seizures like carbamazepine, phenytoin, phenobarbital -cimetidine -clopidogrel -cyclophosphamide -furazolidone -isoniazid -nicotine -orphenadrine -procarbazine -steroid medicines like prednisone or cortisone -stimulant medicines for attention disorders, weight loss, or to stay awake -tamoxifen -theophylline -thiotepa -ticlopidine -tramadol -warfarin This list may not describe all possible interactions. Give your health care provider a list of all the medicines, herbs, non-prescription drugs, or dietary supplements you use. Also tell them if you smoke, drink alcohol, or use illegal drugs. Some items may interact with your medicine. What should I watch for while using this medicine? Tell your doctor if your symptoms do not get better or if they get worse. Visit your doctor or health care professional for regular checks on your progress. Because it may take several weeks to see the full effects of this medicine, it is  important to continue your treatment as prescribed   by your doctor. Patients and their families should watch out for new or worsening thoughts of suicide or depression. Also watch out for sudden changes in feelings such as feeling anxious, agitated, panicky, irritable, hostile, aggressive, impulsive, severely restless, overly excited and hyperactive, or not being able to sleep. If this happens, especially at the beginning of treatment or after a change in dose, call your health care professional. Avoid alcoholic drinks while taking this medicine. Drinking large amounts of alcoholic beverages, using sleeping or anxiety medicines, or quickly stopping the use of these agents while taking this medicine may increase your risk for a seizure. Do not drive or use heavy machinery until you know how this medicine affects you. This medicine can impair your ability to perform these tasks. Do not take this medicine close to bedtime. It may prevent you from sleeping. Your mouth may get dry. Chewing sugarless gum or sucking hard candy, and drinking plenty of water may help. Contact your doctor if the problem does not go away or is severe. The tablet shell for some brands of this medicine does not dissolve. This is normal. The tablet shell may appear whole in the stool. This is not a cause for concern. What side effects may I notice from receiving this medicine? Side effects that you should report to your doctor or health care professional as soon as possible: -allergic reactions like skin rash, itching or hives, swelling of the face, lips, or tongue -breathing problems -changes in vision -confusion -fast or irregular heartbeat -hallucinations -increased blood pressure -redness, blistering, peeling or loosening of the skin, including inside the mouth -seizures -suicidal thoughts or other mood changes -unusually weak or tired -vomiting Side effects that usually do not require medical attention (report to your  doctor or health care professional if they continue or are bothersome): -change in sex drive or performance -constipation -headache -loss of appetite -nausea -tremors -weight loss This list may not describe all possible side effects. Call your doctor for medical advice about side effects. You may report side effects to FDA at 1-800-FDA-1088. Where should I keep my medicine? Keep out of the reach of children. Store at room temperature between 15 and 30 degrees C (59 and 86 degrees F). Throw away any unused medicine after the expiration date. NOTE: This sheet is a summary. It may not cover all possible information. If you have questions about this medicine, talk to your doctor, pharmacist, or health care provider.  2015, Elsevier/Gold Standard. (2012-08-15 12:39:42)  

## 2013-11-10 ENCOUNTER — Encounter (HOSPITAL_COMMUNITY): Payer: Medicaid Other

## 2013-11-17 ENCOUNTER — Telehealth (HOSPITAL_COMMUNITY): Payer: Self-pay

## 2013-11-17 NOTE — Telephone Encounter (Signed)
Encounter complete. 

## 2013-11-19 ENCOUNTER — Ambulatory Visit (HOSPITAL_COMMUNITY)
Admission: RE | Admit: 2013-11-19 | Discharge: 2013-11-19 | Disposition: A | Discharge status: Home / Self Care | Payer: Medicaid Other | Source: Ambulatory Visit | Attending: Internal Medicine | Admitting: Internal Medicine

## 2013-11-19 DIAGNOSIS — R079 Chest pain, unspecified: Secondary | ICD-10-CM | POA: Insufficient documentation

## 2013-11-19 DIAGNOSIS — R002 Palpitations: Secondary | ICD-10-CM | POA: Diagnosis not present

## 2013-11-19 DIAGNOSIS — E785 Hyperlipidemia, unspecified: Secondary | ICD-10-CM | POA: Insufficient documentation

## 2013-11-19 DIAGNOSIS — I10 Essential (primary) hypertension: Secondary | ICD-10-CM | POA: Diagnosis not present

## 2013-11-19 DIAGNOSIS — R0609 Other forms of dyspnea: Secondary | ICD-10-CM | POA: Insufficient documentation

## 2013-11-19 DIAGNOSIS — F17210 Nicotine dependence, cigarettes, uncomplicated: Secondary | ICD-10-CM | POA: Diagnosis not present

## 2013-11-19 MED ORDER — TECHNETIUM TC 99M SESTAMIBI GENERIC - CARDIOLITE
31.0000 | Freq: Once | INTRAVENOUS | Status: AC | PRN
  Administered 2013-11-19: 31 via INTRAVENOUS

## 2013-11-19 MED ORDER — TECHNETIUM TC 99M SESTAMIBI GENERIC - CARDIOLITE
10.9000 | Freq: Once | INTRAVENOUS | Status: AC | PRN
  Administered 2013-11-19: 10.9 via INTRAVENOUS

## 2013-11-19 MED ORDER — AMINOPHYLLINE 25 MG/ML IV SOLN
125.0000 mg | Freq: Once | INTRAVENOUS | Status: AC
  Administered 2013-11-19: 125 mg via INTRAVENOUS

## 2013-11-19 MED ORDER — REGADENOSON 0.4 MG/5ML IV SOLN
0.4000 mg | Freq: Once | INTRAVENOUS | Status: AC
  Administered 2013-11-19: 0.4 mg via INTRAVENOUS

## 2013-11-19 NOTE — Procedures (Addendum)
[facility] [address] [place] [address] [phone]  Cardiology Nuclear Med Study  Melissa Garcia is a 53 y.o. female     MRN : [id number]     DOB: 1960-10-12  Procedure Date: 11/19/2013  Nuclear Med Background Indication for Stress Test:  Stent Patency History:  Asthma, COPD, Emphysema and CAD;MI-10/05/2008;STENT/PTCA-09/2008;Last NUC MPI on 06/14/2011-nonischemic/scar;EF=62% Cardiac Risk Factors: Family History - CAD, Hypertension, Lipids, Overweight and Smoker  Symptoms:  Chest Pain, DOE, Fatigue and Palpitations   Nuclear Pre-Procedure Caffeine/Decaff Intake:  1:00am NPO After: 11am   IV Site: R Forearm  IV 0.9% NS with Angio Cath:  22g  Chest Size (in):  n/a IV Started by: Melissa Course, RN  Height: 5\' 3"  (1.6 m)  Cup Size: B  BMI:  Body mass index is 26.93 kg/(m^2). Weight:  152 lb (68.947 kg)   Tech Comments:  n/a    Nuclear Med Study 1 or 2 day study: 1 day  Stress Test Type:  [place] Provider:  Quay Burow, MD   Resting Radionuclide: Technetium 66m Sestamibi  Resting Radionuclide Dose: 10.9 mCi   Stress Radionuclide:  Technetium 30m Sestamibi  Stress Radionuclide Dose: 31.0 mCi           Stress Protocol Rest HR: 79 Stress HR: 107  Rest BP: 138/92 Stress BP: 162/73  Exercise Time (min): n/a METS: n/a   Predicted Max HR: 166 bpm % Max HR: 64.46 bpm Rate Pressure Product: 17334  Dose of Adenosine (mg):  n/a Dose of Lexiscan: 0.4 mg  Dose of Atropine (mg): n/a Dose of Dobutamine: n/a mcg/kg/min (at max HR)  Stress Test Technologist: Melissa Garcia, CCT Nuclear Technologist: Melissa Garcia, CNMT   Rest Procedure:  Myocardial perfusion imaging was performed at rest 45 minutes following the intravenous administration of Technetium 62m Sestamibi. Stress Procedure:  The patient received IV Lexiscan 0.4 mg over 15-seconds.  Technetium 12m Sestamibi injected at 30-seconds. Patient  experienced marked SOB and 125 mg  Aminophylline  IV was administered. There were no significant changes with Lexiscan.  Quantitative spect images were obtained after a 45 minute delay.  Transient Ischemic Dilatation (Normal <1.22):  0.98   QGS EDV: 100 ml    Q GS ESV:  43 ml LV Ejection Fraction: 57%  Rest ECG: NSR - Normal EKG  Stress ECG: No significant change from baseline ECG  QPS Raw Data Images:  There is interference from nuclear activity from structures below the diaphragm. This does not affect the ability to read the study. Stress Images:  Normal homogeneous uptake in all areas of the myocardium. Rest Images:  Normal homogeneous uptake in all areas of the myocardium. Subtraction (SDS):  No evidence of ischemia.  Impression Exercise Capacity:  Lexiscan with no exercise. BP Response:  Normal blood pressure response. Clinical Symptoms:  No significant symptoms noted. ECG Impression:  No significant ECG changes with Lexiscan. Comparison with Prior Nuclear Study: No significant change from previous study  Overall Impression:  Low risk stress nuclear study with no significant reversible ischemia.  LV Wall Motion:  NL LV Function; NL Wall Motion; EF 57%  Melissa Casino, MD, [facility] Board Certified in Nuclear Cardiology Attending Cardiologist [place] C, MD  11/19/2013 5:03 PM

## 2013-11-20 ENCOUNTER — Encounter: Payer: Medicaid Other | Admitting: Obstetrics and Gynecology

## 2013-11-25 ENCOUNTER — Encounter: Payer: Self-pay | Admitting: Obstetrics and Gynecology

## 2013-11-25 ENCOUNTER — Ambulatory Visit (INDEPENDENT_AMBULATORY_CARE_PROVIDER_SITE_OTHER): Payer: Medicaid Other | Admitting: Obstetrics and Gynecology

## 2013-11-25 VITALS — BP 120/82 | Ht 63.0 in | Wt 156.0 lb

## 2013-11-25 DIAGNOSIS — N393 Stress incontinence (female) (male): Secondary | ICD-10-CM

## 2013-11-25 DIAGNOSIS — R32 Unspecified urinary incontinence: Secondary | ICD-10-CM

## 2013-11-25 NOTE — Progress Notes (Signed)
Patient ID: Melissa Garcia, female   DOB: [birth year], 53 y.o.   MRN: [id number] Pt here today for pessary fitting. Pt denies any problems or concerns at this time.

## 2013-11-25 NOTE — Progress Notes (Signed)
HPI:  Pt here today for pessary fitting. She states that the pessary is to keep her from urinating herself. Pt denies any problems or concerns at this time. She states that she is sexually active. She denies ever using a diaphragm.   She is having SUI . Physical Examination: General appearance - alert, well appearing, and in no distress and oriented to person, place, and time Mental status - alert, oriented to person, place, and time, normal mood, behavior, speech, dress, motor activity, and thought processes Abdomen - soft, nontender, nondistended, no masses or organomegaly Pelvic - VULVA: normal appearing vulva with no masses, tenderness or lesions, good support at introitus, VAGINA: normal appearing vagina with normal color and discharge, no lesions, PELVIC FLOOR EXAM: no cystocele, rectocele or prolapse noted, slight rotation of urethra with valsalva, CERVIX: normal appearing cervix without discharge or lesions, UTERUS: uterus is normal size, shape, consistency and nontender, ADNEXA: normal adnexa in size, nontender and no masses  PESSARY FITTING. Fitted with Milex Incontinence Dish [id number] 65 MM pessary 2 and 9/16inch,     A:  1. SUI  P: 1. F/U for placement of Pessary.   This chart was scribed for Melissa Kind, MD by Steva Colder, ED Scribe. The patient was seen in room 2 at 9:30 AM.

## 2013-12-03 ENCOUNTER — Ambulatory Visit: Payer: Medicaid Other | Admitting: Obstetrics and Gynecology

## 2013-12-03 ENCOUNTER — Encounter: Payer: Self-pay | Admitting: Obstetrics and Gynecology

## 2013-12-07 ENCOUNTER — Encounter: Payer: Self-pay | Admitting: Obstetrics and Gynecology

## 2013-12-30 ENCOUNTER — Telehealth: Payer: Self-pay | Admitting: *Deleted

## 2013-12-30 MED ORDER — ALBUTEROL SULFATE HFA 108 (90 BASE) MCG/ACT IN AERS: 2.0000 | INHALATION_SPRAY | RESPIRATORY_TRACT | Status: DC | PRN

## 2013-12-30 NOTE — Telephone Encounter (Signed)
Med refilled.

## 2014-01-19 ENCOUNTER — Ambulatory Visit (INDEPENDENT_AMBULATORY_CARE_PROVIDER_SITE_OTHER): Payer: Medicaid Other | Admitting: Family Medicine

## 2014-01-19 ENCOUNTER — Encounter: Payer: Self-pay | Admitting: Family Medicine

## 2014-01-19 VITALS — BP 130/76 | HR 78 | Temp 98.3°F | Resp 16 | Ht 62.0 in | Wt 153.0 lb

## 2014-01-19 DIAGNOSIS — J410 Simple chronic bronchitis: Secondary | ICD-10-CM

## 2014-01-19 DIAGNOSIS — R300 Dysuria: Secondary | ICD-10-CM

## 2014-01-19 DIAGNOSIS — B370 Candidal stomatitis: Secondary | ICD-10-CM

## 2014-01-19 DIAGNOSIS — A6000 Herpesviral infection of urogenital system, unspecified: Secondary | ICD-10-CM

## 2014-01-19 DIAGNOSIS — Z23 Encounter for immunization: Secondary | ICD-10-CM

## 2014-01-19 DIAGNOSIS — N760 Acute vaginitis: Secondary | ICD-10-CM

## 2014-01-19 LAB — URINALYSIS, ROUTINE W REFLEX MICROSCOPIC
BILIRUBIN URINE: NEGATIVE
GLUCOSE, UA: NEGATIVE mg/dL
Hgb urine dipstick: NEGATIVE
KETONES UR: NEGATIVE mg/dL
Nitrite: NEGATIVE
PH: 7 (ref 5.0–8.0)
Protein, ur: NEGATIVE mg/dL
SPECIFIC GRAVITY, URINE: 1.02 (ref 1.005–1.030)
Urobilinogen, UA: 0.2 mg/dL (ref 0.0–1.0)

## 2014-01-19 LAB — URINALYSIS, MICROSCOPIC ONLY
CASTS: NONE SEEN
CRYSTALS: NONE SEEN
RBC / HPF: NONE SEEN RBC/hpf (ref <3)

## 2014-01-19 MED ORDER — HYDROCODONE-ACETAMINOPHEN 5-325 MG PO TABS: 1.0000 | ORAL_TABLET | Freq: Four times a day (QID) | ORAL | Status: DC | PRN

## 2014-01-19 MED ORDER — ALBUTEROL SULFATE HFA 108 (90 BASE) MCG/ACT IN AERS: 2.0000 | INHALATION_SPRAY | RESPIRATORY_TRACT | Status: DC | PRN

## 2014-01-19 MED ORDER — TRIAMCINOLONE ACETONIDE 0.1 % EX CREA: 1.0000 "application " | TOPICAL_CREAM | Freq: Two times a day (BID) | CUTANEOUS | Status: DC

## 2014-01-19 MED ORDER — NYSTATIN 100000 UNIT/ML MT SUSP: 5.0000 mL | Freq: Four times a day (QID) | OROMUCOSAL | Status: DC

## 2014-01-19 NOTE — Patient Instructions (Signed)
Try the nystatin swish and swallow for your mouth Try Dulera 1 puff twice a day  Use topical steroid for pain Short course of pain meds Flu shot F/U as needed

## 2014-01-19 NOTE — Assessment & Plan Note (Signed)
Nystatin swish and swalllow

## 2014-01-19 NOTE — Assessment & Plan Note (Signed)
Doubt cause of of symptoms based on rash, will not treat for UTI

## 2014-01-19 NOTE — Assessment & Plan Note (Signed)
Complete Valtrex await results based on apperance and history looks like HSV, given topical TAC for pain, norco #15 tabs

## 2014-01-19 NOTE — Assessment & Plan Note (Signed)
Will give trial of  Dulera I gave her samples in the office one puff twice a day, see if she does better with this than the symbircort

## 2014-01-19 NOTE — Progress Notes (Signed)
Patient ID: Melissa Garcia, female   DOB: 1960/05/17, 53 y.o.   MRN: [id number]   Subjective:    Patient ID: Melissa Garcia, female    DOB: 23-Mar-1960, 53 y.o.   MRN: [id number]  Patient presents for Vaginal Itching; Urinary burning; and Injection patient here with multiple concerns. She has history of COPD she has not been using her Symbicort she states it makes her jittery she has had some wheezing on and off. She does have her albuterol which she requests a refill on this. She does continue to smoke. She's noticed a white film on her tongue which she has had in the past she has used nystatin for this to help clear it up.  She has a rash in her gential region. [facility] she had a couple days of burning and tingling then a rash popped out in the genital region. She's been sexually active with her boyfriend who was recently active with his ex. Her cultures are pending for gonorrhea Chlamydia as well as HSV and other blood work however she was upset that the results were not back and she was in a lot of pain and therefore came into our office. She was given Valtrex and has been taking this over the weekend.    Review Of Systems:  GEN- denies fatigue, fever, weight loss,weakness, recent illness HEENT- denies eye drainage, change in vision, nasal discharge, CVS- denies chest pain, palpitations RESP- denies SOB, cough, wheeze ABD- denies N/V, change in stools, abd pain GU- denies dysuria, hematuria, dribbling, incontinence MSK- denies joint pain, muscle aches, injury Neuro- denies headache, dizziness, syncope, seizure activity       Objective:    BP 130/76 mmHg  Pulse 78  Temp(Src) 98.3 F (36.8 C) (Oral)  Resp 16  Ht 5\' 2"  (1.575 m)  Wt 153 lb (69.4 kg)  BMI 27.98 kg/m2  LMP 08/31/2010 GEN- NAD, alert and oriented x3 HEENT- PERRL, EOMI, non injected sclera, pink conjunctiva, MMM, oropharynx , white plaques on tongue, no post oropharynx  lesions Neck- Supple, no LAD CVS- RRR, no murmur RESP-CTAB, no wheeze GU- normal external genitalia, vaginal mucosa pink and moist, cervix visualized no growth, no blood form os, no discharge, no CMT,  Skin- erythematous scab like lesions across mons pubis, gluteal cleft, labia majora, erythema with yellow tiny yellow lesions on upper labia bilat/ no discrete vescules, TTP EXT- No edema Pulses- Radial 2+        Assessment & Plan:      Problem List Items Addressed This Visit      Unprioritized   Thrush   Relevant Medications      nystatin (MYCOSTATIN) 100000 UNIT/ML suspension   Genital HSV   Relevant Medications      nystatin (MYCOSTATIN) 100000 UNIT/ML suspension   COPD (chronic obstructive pulmonary disease)   Relevant Medications      mometasone-formoterol (DULERA) 200-5 MCG/ACT AERO      albuterol (PROVENTIL HFA;VENTOLIN HFA) 108 (90 BASE) MCG/ACT inhaler   Burning with urination - Primary   Relevant Orders      Urinalysis, Routine w reflex microscopic (Completed)    Other Visit Diagnoses    Vaginitis and vulvovaginitis        Need for prophylactic vaccination and inoculation against influenza        Relevant Orders       Flu Vaccine QUAD 36+ mos PF IM (Fluarix Quad PF) (Completed)  Note: This dictation was prepared with Dragon dictation along with smaller phrase technology. Any transcriptional errors that result from this process are unintentional.

## 2014-02-03 ENCOUNTER — Ambulatory Visit (INDEPENDENT_AMBULATORY_CARE_PROVIDER_SITE_OTHER): Payer: Medicaid Other | Admitting: Adult Health

## 2014-02-03 ENCOUNTER — Encounter: Payer: Self-pay | Admitting: Adult Health

## 2014-02-03 VITALS — BP 122/72 | Ht 63.0 in | Wt 156.5 lb

## 2014-02-03 DIAGNOSIS — N3946 Mixed incontinence: Secondary | ICD-10-CM

## 2014-02-03 DIAGNOSIS — R319 Hematuria, unspecified: Secondary | ICD-10-CM

## 2014-02-03 DIAGNOSIS — B009 Herpesviral infection, unspecified: Secondary | ICD-10-CM

## 2014-02-03 DIAGNOSIS — R300 Dysuria: Secondary | ICD-10-CM

## 2014-02-03 LAB — POCT URINALYSIS DIPSTICK

## 2014-02-03 MED ORDER — NITROFURANTOIN MONOHYD MACRO 100 MG PO CAPS: 100.0000 mg | ORAL_CAPSULE | Freq: Two times a day (BID) | ORAL | Status: DC

## 2014-02-03 MED ORDER — VALACYCLOVIR HCL 1 G PO TABS: 1000.0000 mg | ORAL_TABLET | Freq: Two times a day (BID) | ORAL | Status: DC

## 2014-02-03 NOTE — Progress Notes (Signed)
Subjective:     Patient ID: Melissa Garcia, female   DOB: 31-Jan-1961, 53 y.o.   MRN: [id number]  HPI Melissa Garcia is a 53 year old white female in complaining of pressure and burning in vagina area and with urination and ?sores on labia, was treated for herpes at [facility].She has history of UI and needs pessary but has not heard from [place].Sex hurts at times, feel dry.Denies any bleeding.  Review of Systems See HPI Reviewed past medical,surgical, social and family history. Reviewed medications and allergies.     Objective:   Physical Exam BP 122/72 mmHg  Ht 5\' 3"  (1.6 m)  Wt 156 lb 8 oz (70.988 kg)  BMI 27.73 kg/m2  LMP 07/26/2012urine 1+ blood trace protein and 2+ leuks, Skin warm and dry.Pelvic: external genitalia is normal in appearance, except for 2 excoriated areas right labia and they are dry now., vagina: atrophic, cervix:smooth, uterus: normal size, shape and contour, non tender, no masses felt, adnexa: no masses or tenderness noted. Called [facility] they have no Rx for pessary will reorder.    Assessment:    burning with urination UI mixed Herpes Hematuria     Plan:     Rx valtrex 1 gm 1 bid x 10 days #20 with 1 refill Rx macrobid 1 bid x 7 days 314 no refills' UA C&S sent Rx 65 mm dish pessary Return in 1 week to see Dr Glo Herring for pessary fitting Try luvena for vaginal moisture

## 2014-02-03 NOTE — Patient Instructions (Addendum)
Hematuria Hematuria is blood in your urine. It can be caused by a bladder infection, kidney infection, prostate infection, kidney stone, or cancer of your urinary tract. Infections can usually be treated with medicine, and a kidney stone usually will pass through your urine. If neither of these is the cause of your hematuria, further workup to find out the reason may be needed. It is very important that you tell your health care provider about any blood you see in your urine, even if the blood stops without treatment or happens without causing pain. Blood in your urine that happens and then stops and then happens again can be a symptom of a very serious condition. Also, pain is not a symptom in the initial stages of many urinary cancers. HOME CARE INSTRUCTIONS   Drink lots of fluid, 3-4 quarts a day. If you have been diagnosed with an infection, cranberry juice is especially recommended, in addition to large amounts of water.  Avoid caffeine, tea, and carbonated beverages because they tend to irritate the bladder.  Avoid alcohol because it may irritate the prostate.  Take all medicines as directed by your health care provider.  If you were prescribed an antibiotic medicine, finish it all even if you start to feel better.  If you have been diagnosed with a kidney stone, follow your health care provider's instructions regarding straining your urine to catch the stone.  Empty your bladder often. Avoid holding urine for long periods of time.  After a bowel movement, women should cleanse front to back. Use each tissue only once.  Empty your bladder before and after sexual intercourse if you are a female. SEEK MEDICAL CARE IF:  You develop back pain.  You have a fever.  You have a feeling of sickness in your stomach (nausea) or vomiting.  Your symptoms are not better in 3 days. Return sooner if you are getting worse. SEEK IMMEDIATE MEDICAL CARE IF:   You develop severe vomiting and are  unable to keep the medicine down.  You develop severe back or abdominal pain despite taking your medicines.  You begin passing a large amount of blood or clots in your urine.  You feel extremely weak or faint, or you pass out. MAKE SURE YOU:   Understand these instructions.  Will watch your condition.  Will get help right away if you are not doing well or get worse. Document Released: 01/22/2005 Document Revised: 06/08/2013 Document Reviewed: 09/22/2012 [facility] Patient Information 2015 [place], [place]. This information is not intended to replace advice given to you by your health care provider. Make sure you discuss any questions you have with your health care provider. Take valtrex Increase water Take macrobid Return in 1 week for pessary Try luvena

## 2014-02-04 ENCOUNTER — Telehealth: Payer: Self-pay | Admitting: Adult Health

## 2014-02-04 LAB — URINALYSIS
BILIRUBIN URINE: NEGATIVE
GLUCOSE, UA: NEGATIVE mg/dL
Ketones, ur: NEGATIVE mg/dL
Nitrite: NEGATIVE
PROTEIN: NEGATIVE mg/dL
Specific Gravity, Urine: <1.005 — ABNORMAL LOW (ref 1.005–1.030)
UROBILINOGEN UA: 0.2 mg/dL (ref 0.0–1.0)
pH: 6.5 (ref 5.0–8.0)

## 2014-02-04 NOTE — Telephone Encounter (Signed)
Unable to reach pt at the # left. Left message at different # in chart x 2. [place]

## 2014-02-04 NOTE — Telephone Encounter (Signed)
Unable to reach pt at the # left. I called a different # in chart. Left message x 1. [place]

## 2014-02-06 LAB — URINE CULTURE: Colony Count: >=100000

## 2014-02-08 ENCOUNTER — Telehealth: Payer: Self-pay | Admitting: Adult Health

## 2014-02-08 NOTE — Telephone Encounter (Signed)
Left message to call about urine 

## 2014-02-08 NOTE — Telephone Encounter (Signed)
Unable to reach pt at the # left. Left message at different # in chart x 3. Encounter closed. [place]

## 2014-02-08 NOTE — Telephone Encounter (Signed)
Left message with her Mom to call me

## 2014-02-09 ENCOUNTER — Telehealth: Payer: Self-pay | Admitting: Adult Health

## 2014-02-09 NOTE — Telephone Encounter (Signed)
Left message

## 2014-02-09 NOTE — Telephone Encounter (Signed)
Left message to call about urine,needs different antibiotic

## 2014-02-10 ENCOUNTER — Encounter: Payer: Medicaid Other | Admitting: Obstetrics and Gynecology

## 2014-02-10 ENCOUNTER — Encounter: Payer: Self-pay | Admitting: Obstetrics and Gynecology

## 2014-02-11 ENCOUNTER — Telehealth: Payer: Self-pay | Admitting: Adult Health

## 2014-02-11 NOTE — Telephone Encounter (Signed)
Left message to call me back.

## 2014-02-17 ENCOUNTER — Telehealth: Payer: Self-pay | Admitting: Cardiovascular Disease

## 2014-02-17 NOTE — Telephone Encounter (Signed)
Returned call to patient she stated has been having chest pain,shoulder pain,mid back pain off and on for the last 2 months.Stated she had a stress test in October does not know results.Advised stress test 11/19/13 was normal.Appointment offered with Dr.Berry this morning but unable to come.Stated she does not have transportation.Appointment scheduled with Ignacia Bayley NP Friday 02/19/14 at 9:30 am at our [facility] office.Advised to go to ER if needed.

## 2014-02-17 NOTE — Telephone Encounter (Signed)
Pt never got her stress test results from 10-15. She is still having chest and shoulder pain,it is worse than it used to be. Also her blood pressure been going up lately and she is having bad headaches.

## 2014-02-18 ENCOUNTER — Encounter: Payer: Self-pay | Admitting: Adult Health

## 2014-02-18 ENCOUNTER — Telehealth: Payer: Self-pay | Admitting: Adult Health

## 2014-02-18 MED ORDER — CIPROFLOXACIN HCL 500 MG PO TABS: 500.0000 mg | ORAL_TABLET | Freq: Two times a day (BID) | ORAL | Status: DC

## 2014-02-18 NOTE — Telephone Encounter (Signed)
Letter sent and rx'd cipro

## 2014-02-18 NOTE — Telephone Encounter (Signed)
Pt aware needs another antibiotic and that cipro has been called in and that she will get letter in mail that was sent today about this.

## 2014-02-19 ENCOUNTER — Encounter: Payer: Self-pay | Admitting: Nurse Practitioner

## 2014-02-19 ENCOUNTER — Ambulatory Visit (INDEPENDENT_AMBULATORY_CARE_PROVIDER_SITE_OTHER): Payer: Medicaid Other | Admitting: Nurse Practitioner

## 2014-02-19 VITALS — BP 132/76 | HR 71 | Ht 63.0 in | Wt 158.2 lb

## 2014-02-19 DIAGNOSIS — R079 Chest pain, unspecified: Secondary | ICD-10-CM

## 2014-02-19 DIAGNOSIS — Z720 Tobacco use: Secondary | ICD-10-CM | POA: Insufficient documentation

## 2014-02-19 DIAGNOSIS — I25118 Atherosclerotic heart disease of native coronary artery with other forms of angina pectoris: Secondary | ICD-10-CM | POA: Insufficient documentation

## 2014-02-19 DIAGNOSIS — I255 Ischemic cardiomyopathy: Secondary | ICD-10-CM | POA: Insufficient documentation

## 2014-02-19 DIAGNOSIS — I2510 Atherosclerotic heart disease of native coronary artery without angina pectoris: Secondary | ICD-10-CM

## 2014-02-19 DIAGNOSIS — R0789 Other chest pain: Secondary | ICD-10-CM

## 2014-02-19 MED ORDER — NITROGLYCERIN 0.4 MG SL SUBL: 0.4000 mg | SUBLINGUAL_TABLET | SUBLINGUAL | Status: DC | PRN

## 2014-02-19 NOTE — Progress Notes (Signed)
Patient Name: Melissa Garcia Date of Encounter: 02/19/2014  Primary Care Provider:  [name], PA-C Primary Cardiologist:  [name], MD   Chief Complaint  54 y/o female with a h/o CAD who presents today for evaluation r/t intermittent sharp/fleeting chest pain.  Past Medical History   Past Medical History  Diagnosis Date  . Coronary atherosclerosis of native coronary artery     a. 09/2008 Ant STEMI/VF Arrest/PCI: LM nl, LAD 71m (2.75x23 Xience DES), D1 small, D2 90ost (PTCA), LCX small, min irregs, RI nl, RCA 20-30 diff, EF 30%;  b. 11/2013 Lexi MV: EF 57%, no ischemia.  . Essential hypertension, benign   . Bipolar disorder   . COPD (chronic obstructive pulmonary disease)   . Eczema   . Stress incontinence   . Fibroids   . History of UTI 10/29/2013  . History of Ischemic Cardiomyopathy - resolved     a. 09/2008 EF 30% @ time of MI;  b. 05/2012 Echo: EF 55-60%, no rwma.  . Tobacco abuse    Past Surgical History  Procedure Laterality Date  . Appendectomy    . Tubal ligation    . Coronary stent placement      heart stent  . Cataract extraction w/phaco Left 01/26/2013    Procedure: LEFT EYE CATARACT EXTRACTION PHACO AND INTRAOCULAR LENS PLACEMENT  CDE=6.68;  Surgeon: Tonny Branch, MD;  Location: [facility];  Service: Ophthalmology;  Laterality: Left;  [facility] Kitchen Eye surgery Bilateral 01/2013    cataracts  . Cataract extraction w/phaco Right 01/08/2013    Procedure: CATARACT EXTRACTION PHACO AND INTRAOCULAR LENS PLACEMENT (IOC);  Surgeon: Tonny Branch, MD;  Location: [facility];  Service: Ophthalmology;  Laterality: Right;  CDE 9.04    Allergies  Allergies  Allergen Reactions  . Codeine Nausea And Vomiting    Vicodin/Percocet Specified= excessive vomiting  . Depakote [Divalproex Sodium]     "Mood Swings--Felt like out of my mind"  . Sulfonamide Derivatives Hives  . Toviaz [Fesoterodine Fumarate Er]     Extremely dry mouth, felt hot like hotflashes. Felt dehydrated.    HPI  54 y/o  female with the above problem list. She is status post anterior ST segment elevation myocardial infarction, complicated by ventricular fibrillation arrest in August 2010. At that time, she underwent successful PCI drug-eluting stent placement to the LAD along with PTCA to the second diagonal. EF was 30% initially however follow-up echocardiography 2014 revealed normal LV function. She was seen in clinic by Dr. Gwenlyn Found last June secondary to complaints of intermittent sharp and shooting chest pain that was fleeting in nature lasting just a few seconds and resolving spontaneously. A Myoview was scheduled and subsequently canceled and rescheduled on multiple occasions. She did not end up having the stress test until October of last year. This showed normal LV function without evidence of ischemia. She says that since her last visit with Dr. Gwenlyn Found in June 2015, she has continued to have intermittent sharp and shooting chest, left arm, and mid scapular pain without associated symptoms, lasting a second or 2, and resolving spontaneously. This is not necessarily limit her activities but does weigh on her and sometimes causes panic attacks.  At this point, she has some degree of shooting chest, arm, or back pain on a daily basis. She denies PND, orthopnea, dizziness, syncope, edema, or early satiety. She continues to smoke cigarettes.  Home Medications  Prior to Admission medications   Medication Sig Start Date End Date Taking? Authorizing Provider  albuterol (  PROVENTIL HFA;VENTOLIN HFA) 108 (90 BASE) MCG/ACT inhaler Inhale 2 puffs into the lungs every 4 (four) hours as needed for wheezing. 01/19/14  Yes Alycia Rossetti, MD  aspirin EC 81 MG tablet Take 81 mg by mouth daily.     Yes Historical Provider, MD  cetirizine (ZYRTEC) 10 MG tablet Take 10 mg by mouth daily. 09/06/13  Yes Billy Fischer, MD  ciprofloxacin (CIPRO) 500 MG tablet Take 1 tablet (500 mg total) by mouth 2 (two) times daily. 02/18/14  Yes Estill Dooms, NP  Clobetasol Prop Emollient Base (CLOBETASOL PROPIONATE E) 0.05 % emollient cream Apply 1 application topically 2 (two) times daily as needed (Rash). To back for rash   Yes Historical Provider, MD  hydrOXYzine (ATARAX/VISTARIL) 10 MG tablet Take 10 mg by mouth 3 (three) times daily as needed for anxiety.   Yes Historical Provider, MD  mometasone-formoterol (DULERA) 200-5 MCG/ACT AERO Inhale 2 puffs into the lungs 2 (two) times daily.   Yes Historical Provider, MD  nitroGLYCERIN (NITROSTAT) 0.4 MG SL tablet Place 1 tablet (0.4 mg total) under the tongue every 5 (five) minutes as needed for chest pain. 02/19/14  Yes Rogelia Mire, NP  Omega-3 Fatty Acids (FISH OIL PO) Take 1 capsule by mouth daily.    Yes Historical Provider, MD  ranitidine (ZANTAC) 75 MG tablet Take 75 mg by mouth daily as needed for heartburn.    Yes Historical Provider, MD  sertraline (ZOLOFT) 50 MG tablet Take 1 tablet (50 mg total) by mouth daily. Patient taking differently: Take 100 mg by mouth daily.  06/01/13  Yes Orlena Sheldon, PA-C  triamcinolone cream (KENALOG) 0.1 % Apply 1 application topically 2 (two) times daily. Patient taking differently: Apply 1 application topically 2 (two) times daily as needed (for inflammation...).  01/19/14  Yes Alycia Rossetti, MD    Review of Systems  Sharp, fleeting chest, left arm, and scapular/back pain on a regular basis as outlined above. She struggles with anxiety and bipolar depression. Her medications are currently being adjusted by her psychiatrist.  All other systems reviewed and are otherwise negative except as noted above.  Physical Exam  Blood pressure 132/76, pulse 71, height 5\' 3"  (1.6 m), weight 158 lb 3.2 oz (71.759 kg), last menstrual period 08/31/2010.  General: Pleasant, NAD Psych: Normal affect. Neuro: Alert and oriented X 3. Moves all extremities spontaneously. HEENT: Normal  Neck: Supple without bruits or JVD. Lungs:  Resp regular and unlabored,  CTA. Heart: RRR no s3, s4, or murmurs. Abdomen: Soft, non-tender, non-distended, BS + x 4.  Extremities: No clubbing, cyanosis or edema. DP/PT/Radials 2+ and equal bilaterally.  Accessory Clinical Findings  ECG - regular sinus rhythm, PVC, 71, no acute ST or T changes.  Assessment & Plan  1.  Atypical chest pain/coronary artery disease: Patient presents with a 9 month or more history of intermittent sharp and shooting, fleeting chest, back, and arm pain lasting a second or 2 and resolving spontaneously. This is occurring more frequently today than it had been a year ago. She did have a Myoview in October which was low risk with normal LV function and without evidence of ischemia. Of note, prior anginal equivalent was substernal chest pressure and heaviness and she has not had any of that. ECG is without objective evidence of ischemia and in light of normal stress test and atypical symptoms, I would not pursue any additional ischemic evaluation at this time. She remains on aspirin therapy.  2.  Lipids: Patient is not on a statin. Her last LDL in June 2015 was 75.  3. Bipolar disorder/anxiety: She is followed closely by psychiatry and her medications are in the process of being adjusted.  4. Tobacco Abuse:  Complete cessation advised.  5.  Disposition: Follow-up with Dr. Gwenlyn Found in 6 months or sooner if necessary.    Murray Hodgkins, NP 02/19/2014, 9:59 AM

## 2014-02-19 NOTE — Patient Instructions (Signed)
Your physician recommends that you continue on your current medications as directed. Please refer to the Current Medication list given to you today.  A refill has been sent to the pharmacy for your nitroglycerin.  Your physician wants you to follow-up in: 6 months with Dr. Gwenlyn Found. You will receive a reminder letter in the mail two months in advance. If you don't receive a letter, please call our office to schedule the follow-up appointment.

## 2014-02-25 ENCOUNTER — Ambulatory Visit: Payer: Medicaid Other | Admitting: Family Medicine

## 2014-03-01 ENCOUNTER — Ambulatory Visit: Payer: Medicaid Other | Admitting: Family Medicine

## 2014-03-02 ENCOUNTER — Ambulatory Visit: Payer: Medicaid Other | Admitting: Family Medicine

## 2014-03-11 ENCOUNTER — Encounter: Payer: Self-pay | Admitting: Physician Assistant

## 2014-03-11 ENCOUNTER — Ambulatory Visit (INDEPENDENT_AMBULATORY_CARE_PROVIDER_SITE_OTHER): Payer: Medicaid Other | Admitting: Physician Assistant

## 2014-03-11 VITALS — BP 136/78 | HR 71 | Temp 98.0°F | Resp 18 | Wt 156.0 lb

## 2014-03-11 DIAGNOSIS — F17200 Nicotine dependence, unspecified, uncomplicated: Secondary | ICD-10-CM

## 2014-03-11 DIAGNOSIS — J988 Other specified respiratory disorders: Secondary | ICD-10-CM

## 2014-03-11 DIAGNOSIS — R3915 Urgency of urination: Secondary | ICD-10-CM

## 2014-03-11 DIAGNOSIS — J438 Other emphysema: Secondary | ICD-10-CM

## 2014-03-11 DIAGNOSIS — Z720 Tobacco use: Secondary | ICD-10-CM

## 2014-03-11 DIAGNOSIS — B9689 Other specified bacterial agents as the cause of diseases classified elsewhere: Secondary | ICD-10-CM

## 2014-03-11 DIAGNOSIS — B370 Candidal stomatitis: Secondary | ICD-10-CM

## 2014-03-11 LAB — URINALYSIS, ROUTINE W REFLEX MICROSCOPIC
BILIRUBIN URINE: NEGATIVE
GLUCOSE, UA: NEGATIVE mg/dL
HGB URINE DIPSTICK: NEGATIVE
KETONES UR: NEGATIVE mg/dL
Leukocytes, UA: NEGATIVE
NITRITE: NEGATIVE
Protein, ur: NEGATIVE mg/dL
Specific Gravity, Urine: <1.005 — ABNORMAL LOW (ref 1.005–1.030)
UROBILINOGEN UA: 0.2 mg/dL (ref 0.0–1.0)
pH: 5.5 (ref 5.0–8.0)

## 2014-03-11 MED ORDER — AZITHROMYCIN 250 MG PO TABS: ORAL_TABLET | ORAL | Status: DC

## 2014-03-11 MED ORDER — PREDNISONE 20 MG PO TABS: ORAL_TABLET | ORAL | Status: DC

## 2014-03-11 MED ORDER — NYSTATIN 100000 UNIT/ML MT SUSP: 5.0000 mL | Freq: Four times a day (QID) | OROMUCOSAL | Status: DC

## 2014-03-11 NOTE — Progress Notes (Signed)
Patient ID: Melissa Garcia MRN: [id number], DOB: 02/19/1960, 54 y.o. Date of Encounter: 03/11/2014, 2:26 PM    Chief Complaint:  Chief Complaint  Patient presents with  . sick x 1 week    head congestion, left ear ache, now chest getting tight with chills  . still with urinary pressure after UTI     HPI: 54 y.o. year old white female resents with above symptoms. Has had head and nasal congestion but says now her chest is getting worse and is having wheezing. Has been using her albuterol 3 times a day. Feels cold like she may have chills but has had no fever documented. No  significant sore throat.     Home Meds:   Outpatient Prescriptions Prior to Visit  Medication Sig Dispense Refill  . albuterol (PROVENTIL HFA;VENTOLIN HFA) 108 (90 BASE) MCG/ACT inhaler Inhale 2 puffs into the lungs every 4 (four) hours as needed for wheezing. 1 Inhaler 6  . aspirin EC 81 MG tablet Take 81 mg by mouth daily.      . cetirizine (ZYRTEC) 10 MG tablet Take 10 mg by mouth daily.    . Clobetasol Prop Emollient Base (CLOBETASOL PROPIONATE E) 0.05 % emollient cream Apply 1 application topically 2 (two) times daily as needed (Rash). To back for rash    . hydrOXYzine (ATARAX/VISTARIL) 10 MG tablet Take 10 mg by mouth 3 (three) times daily as needed for anxiety.    . mometasone-formoterol (DULERA) 200-5 MCG/ACT AERO Inhale 2 puffs into the lungs 2 (two) times daily.    . nitroGLYCERIN (NITROSTAT) 0.4 MG SL tablet Place 1 tablet (0.4 mg total) under the tongue every 5 (five) minutes as needed for chest pain. 25 tablet 3  . Omega-3 Fatty Acids (FISH OIL PO) Take 1 capsule by mouth daily.     . ranitidine (ZANTAC) 75 MG tablet Take 75 mg by mouth daily as needed for heartburn.     . sertraline (ZOLOFT) 50 MG tablet Take 1 tablet (50 mg total) by mouth daily. (Patient taking differently: Take 100 mg by mouth daily. ) 30 tablet 1  . triamcinolone cream (KENALOG) 0.1 % Apply 1 application topically 2 (two) times  daily. (Patient taking differently: Apply 1 application topically 2 (two) times daily as needed (for inflammation...). ) 45 g 0  . ciprofloxacin (CIPRO) 500 MG tablet Take 1 tablet (500 mg total) by mouth 2 (two) times daily. (Patient not taking: Reported on 03/11/2014) 14 tablet 0   No facility-administered medications prior to visit.    Allergies:  Allergies  Allergen Reactions  . Codeine Nausea And Vomiting    Vicodin/Percocet Specified= excessive vomiting  . Depakote [Divalproex Sodium]     "Mood Swings--Felt like out of my mind"  . Sulfonamide Derivatives Hives  . Toviaz [Fesoterodine Fumarate Er]     Extremely dry mouth, felt hot like hotflashes. Felt dehydrated.      Review of Systems: See HPI for pertinent ROS. All other ROS negative.    Physical Exam: Blood pressure 136/78, pulse 71, temperature 98 F (36.7 C), temperature source Oral, resp. rate 18, weight 156 lb (70.761 kg), last menstrual period 08/31/2010., Body mass index is 27.64 kg/(m^2). General: WNWD WF.  Appears in no acute distress. HEENT: Normocephalic, atraumatic, eyes without discharge, sclera non-icteric, nares are without discharge. Bilateral auditory canals clear, TM's are without perforation, pearly grey and translucent with reflective cone of light bilaterally. Oral cavity moist, posterior pharynx without exudate, erythema, peritonsillar abscess. No tenderness  with percussion of frontal and maxillary sinuses bilaterally.  Neck: Supple. No thyromegaly. No lymphadenopathy. Lungs: Mild wheezes scattered throughout bilaterally. Still has good air movement. Heart: Regular rhythm. No murmurs, rubs, or gallops. Msk:  Strength and tone normal for age. Extremities/Skin: Warm and dry. Neuro: Alert and oriented X 3. Moves all extremities spontaneously. Gait is normal. CNII-XII grossly in tact. Psych:  Responds to questions appropriately with a normal affect.     ASSESSMENT AND PLAN:  54 y.o. year old female with    1. Bacterial respiratory infection - azithromycin (ZITHROMAX) 250 MG tablet; Day 1: Take 2 daily.  Days 2-5: Take 1 daily.  Dispense: 6 tablet; Refill: 0 - predniSONE (DELTASONE) 20 MG tablet; Take 2 daily for 5 days  Dispense: 10 tablet; Refill: 0  Take antibiotic and prednisone as directed. Continue to use the albuterol every 4 hours as needed. Follow-up if breathing worsens or if symptoms do not resolve within 1 week after completion of antibiotics.  2. TOBACCO ABUSE - predniSONE (DELTASONE) 20 MG tablet; Take 2 daily for 5 days  Dispense: 10 tablet; Refill: 0  3. EMPHYSEMA - predniSONE (DELTASONE) 20 MG tablet; Take 2 daily for 5 days  Dispense: 10 tablet; Refill: 0  4. Thrush - nystatin (MYCOSTATIN) 100000 UNIT/ML suspension; Take 5 mLs (500,000 Units total) by mouth 4 (four) times daily.  Dispense: 60 mL; Refill: 0 Patient states that recently when she had used antibiotics and prednisone it caused thrush and she had been. Given prescription and refill. Says that she thinks she picked up the refill at the pharmacy but she must have misplaced it. Was unable to find the bottle. Says the last time she used antibiotics and prednisone and she got thick coating in her mouth that would not scrape off with her toothbrush.  5. Urinary urgency - Urinalysis, Routine w reflex microscopic Patient states that she just wanted to check her urine while she was here. Reassured her that urinalysis is normal and has no indication of UTI. Results for orders placed or performed in visit on 03/11/14  Urinalysis, Routine w reflex microscopic  Result Value Ref Range   Color, Urine YELLOW YELLOW   APPearance CLEAR CLEAR   Specific Gravity, Urine <1.005 (L) 1.005 - 1.030   pH 5.5 5.0 - 8.0   Glucose, UA NEG NEG mg/dL   Bilirubin Urine NEG NEG   Ketones, ur NEG NEG mg/dL   Hgb urine dipstick NEG NEG   Protein, ur NEG NEG mg/dL   Urobilinogen, UA 0.2 0.0 - 1.0 mg/dL   Nitrite NEG NEG   Leukocytes, UA NEG  NEG     Signed, [address] [place], [place], [facility] 03/11/2014 2:26 PM

## 2014-04-09 ENCOUNTER — Telehealth: Payer: Self-pay | Admitting: Family Medicine

## 2014-04-09 DIAGNOSIS — B370 Candidal stomatitis: Secondary | ICD-10-CM

## 2014-04-09 MED ORDER — NYSTATIN 100000 UNIT/ML MT SUSP: 5.0000 mL | Freq: Four times a day (QID) | OROMUCOSAL | Status: DC

## 2014-04-09 NOTE — Telephone Encounter (Signed)
Medication refilled per protocol. 

## 2014-05-23 ENCOUNTER — Other Ambulatory Visit: Payer: Self-pay | Admitting: Physician Assistant

## 2014-05-24 NOTE — Telephone Encounter (Signed)
Oral Nystatin denied back to pharmacy

## 2014-07-11 ENCOUNTER — Emergency Department (HOSPITAL_COMMUNITY): Payer: Medicaid - Out of State

## 2014-07-11 ENCOUNTER — Emergency Department (HOSPITAL_COMMUNITY)
Admission: EM | Admit: 2014-07-11 | Discharge: 2014-07-12 | Disposition: A | Discharge status: Home / Self Care | Payer: Medicaid - Out of State | Attending: Emergency Medicine | Admitting: Emergency Medicine

## 2014-07-11 ENCOUNTER — Encounter (HOSPITAL_COMMUNITY): Payer: Self-pay | Admitting: Emergency Medicine

## 2014-07-11 DIAGNOSIS — Z720 Tobacco use: Secondary | ICD-10-CM | POA: Diagnosis not present

## 2014-07-11 DIAGNOSIS — R05 Cough: Secondary | ICD-10-CM | POA: Diagnosis present

## 2014-07-11 DIAGNOSIS — Z8673 Personal history of transient ischemic attack (TIA), and cerebral infarction without residual deficits: Secondary | ICD-10-CM | POA: Diagnosis not present

## 2014-07-11 DIAGNOSIS — Z86018 Personal history of other benign neoplasm: Secondary | ICD-10-CM | POA: Diagnosis not present

## 2014-07-11 DIAGNOSIS — Z7982 Long term (current) use of aspirin: Secondary | ICD-10-CM | POA: Insufficient documentation

## 2014-07-11 DIAGNOSIS — I10 Essential (primary) hypertension: Secondary | ICD-10-CM | POA: Insufficient documentation

## 2014-07-11 DIAGNOSIS — Z872 Personal history of diseases of the skin and subcutaneous tissue: Secondary | ICD-10-CM | POA: Diagnosis not present

## 2014-07-11 DIAGNOSIS — J209 Acute bronchitis, unspecified: Secondary | ICD-10-CM | POA: Insufficient documentation

## 2014-07-11 DIAGNOSIS — I2510 Atherosclerotic heart disease of native coronary artery without angina pectoris: Secondary | ICD-10-CM | POA: Diagnosis not present

## 2014-07-11 DIAGNOSIS — J441 Chronic obstructive pulmonary disease with (acute) exacerbation: Secondary | ICD-10-CM | POA: Diagnosis not present

## 2014-07-11 DIAGNOSIS — Z87440 Personal history of urinary (tract) infections: Secondary | ICD-10-CM | POA: Insufficient documentation

## 2014-07-11 DIAGNOSIS — F319 Bipolar disorder, unspecified: Secondary | ICD-10-CM | POA: Diagnosis not present

## 2014-07-11 LAB — URINALYSIS, ROUTINE W REFLEX MICROSCOPIC
BILIRUBIN URINE: NEGATIVE
Glucose, UA: NEGATIVE mg/dL
KETONES UR: NEGATIVE mg/dL
Leukocytes, UA: NEGATIVE
Nitrite: NEGATIVE
PROTEIN: NEGATIVE mg/dL
Specific Gravity, Urine: 1.01 (ref 1.005–1.030)
UROBILINOGEN UA: 0.2 mg/dL (ref 0.0–1.0)
pH: 7 (ref 5.0–8.0)

## 2014-07-11 LAB — URINE MICROSCOPIC-ADD ON

## 2014-07-11 MED ORDER — PREDNISONE 50 MG PO TABS
60.0000 mg | ORAL_TABLET | Freq: Once | ORAL | Status: AC
  Administered 2014-07-11: 60 mg via ORAL
  Filled 2014-07-11 (×2): qty 1

## 2014-07-11 MED ORDER — IPRATROPIUM-ALBUTEROL 0.5-2.5 (3) MG/3ML IN SOLN
3.0000 mL | Freq: Once | RESPIRATORY_TRACT | Status: AC
  Administered 2014-07-12: 3 mL via RESPIRATORY_TRACT
  Filled 2014-07-11: qty 3

## 2014-07-11 MED ORDER — ALBUTEROL SULFATE (2.5 MG/3ML) 0.083% IN NEBU
2.5000 mg | INHALATION_SOLUTION | Freq: Once | RESPIRATORY_TRACT | Status: AC
  Administered 2014-07-12: 2.5 mg via RESPIRATORY_TRACT
  Filled 2014-07-11: qty 3

## 2014-07-11 NOTE — ED Provider Notes (Signed)
CSN: [id number]     Arrival date & time 07/11/14  2237 History  This chart was scribed for Ripley Fraise, MD by Meriel Pica, ED Scribe. This patient was seen in room APA12/AP[facility]12 and the patient's care was started 11:13 PM.  Chief Complaint  Patient presents with  . Cough   Patient is a 54 y.o. female presenting with cough. The history is provided by the patient and the spouse. No language interpreter was used.  Cough Cough characteristics:  Productive Sputum characteristics:  Clear Severity:  Moderate Onset quality:  Gradual Duration:  7 days Timing:  Constant Progression:  Worsening Associated symptoms: shortness of breath and wheezing   Associated symptoms: no fever     HPI Comments: Melissa Garcia is a 54 y.o. female, with PMhx of COPD and current smoker, who presents to the Emergency Department complaining of a progressively worsening productive cough with clear sputum onset 7 days ago. She associates wheezing and SOB onset 3 days ago and 1 episode of vomiting 2 days ago. She reports that earlier in the ED she had sharp, shooting CP that lasted a few seconds. She also notes dysuria and sharp pains in her lower abdomen. She has used prescribed inhalers at home without relief. Pt states she is currently thinking about smoking cessation. She denies fever, current nausea, and diarrhea.   Past Medical History  Diagnosis Date  . Coronary atherosclerosis of native coronary artery     a. 09/2008 Ant STEMI/VF Arrest/PCI: LM nl, LAD 22m (2.75x23 Xience DES), D1 small, D2 90ost (PTCA), LCX small, min irregs, RI nl, RCA 20-30 diff, EF 30%;  b. 11/2013 Lexi MV: EF 57%, no ischemia.  . Essential hypertension, benign   . Bipolar disorder   . COPD (chronic obstructive pulmonary disease)   . Eczema   . Stress incontinence   . Fibroids   . History of UTI 10/29/2013  . History of Ischemic Cardiomyopathy - resolved     a. 09/2008 EF 30% @ time of MI;  b. 05/2012 Echo: EF 55-60%, no rwma.  .  Tobacco abuse    Past Surgical History  Procedure Laterality Date  . Appendectomy    . Tubal ligation    . Coronary stent placement      heart stent  . Cataract extraction w/phaco Left 01/26/2013    Procedure: LEFT EYE CATARACT EXTRACTION PHACO AND INTRAOCULAR LENS PLACEMENT  CDE=6.68;  Surgeon: Tonny Branch, MD;  Location: [facility];  Service: Ophthalmology;  Laterality: Left;  Marland Kitchen Eye surgery Bilateral 01/2013    cataracts  . Cataract extraction w/phaco Right 01/08/2013    Procedure: CATARACT EXTRACTION PHACO AND INTRAOCULAR LENS PLACEMENT (IOC);  Surgeon: Tonny Branch, MD;  Location: [facility];  Service: Ophthalmology;  Laterality: Right;  CDE 9.04   Family History  Problem Relation Age of Onset  . Cancer Father     lung  . Cancer Mother     skin  . Stroke Mother   . Hypertension Mother   . Birth defects Sister     born with hole in her heart   History  Substance Use Topics  . Smoking status: Current Every Day Smoker -- 0.50 packs/day for 30 years    Types: Cigarettes  . Smokeless tobacco: Never Used  . Alcohol Use: No   OB History    Gravida Para Term Preterm AB TAB SAB Ectopic Multiple Living   5 3 0 0 2 0 2 0 0 3      Review of  Systems  Constitutional: Negative for fever.  Respiratory: Positive for cough, shortness of breath and wheezing.   Gastrointestinal: Positive for vomiting and abdominal pain. Negative for nausea and diarrhea.  Genitourinary: Positive for dysuria.  All other systems reviewed and are negative.  Allergies  Codeine; Depakote; Sulfonamide derivatives; and Toviaz  Home Medications   Prior to Admission medications   Medication Sig Start Date End Date Taking? Authorizing Provider  albuterol (PROVENTIL HFA;VENTOLIN HFA) 108 (90 BASE) MCG/ACT inhaler Inhale 2 puffs into the lungs every 4 (four) hours as needed for wheezing. 01/19/14  Yes Alycia Rossetti, MD  aspirin EC 81 MG tablet Take 81 mg by mouth daily.     Yes Historical Provider, MD  cetirizine  (ZYRTEC) 10 MG tablet Take 10 mg by mouth daily. 09/06/13  Yes Billy Fischer, MD  Clobetasol Prop Emollient Base (CLOBETASOL PROPIONATE E) 0.05 % emollient cream Apply 1 application topically 2 (two) times daily as needed (Rash). To back for rash   Yes Historical Provider, MD  fluticasone (FLONASE) 50 MCG/ACT nasal spray Place 1 spray into both nostrils daily as needed for allergies or rhinitis.   Yes Historical Provider, MD  hydrOXYzine (ATARAX/VISTARIL) 10 MG tablet Take 10 mg by mouth 3 (three) times daily as needed for anxiety.   Yes Historical Provider, MD  ibuprofen (ADVIL,MOTRIN) 800 MG tablet Take 800 mg by mouth every 8 (eight) hours as needed for mild pain.   Yes Historical Provider, MD  Omega-3 Fatty Acids (FISH OIL PO) Take 1 capsule by mouth daily.    Yes Historical Provider, MD  ranitidine (ZANTAC) 75 MG tablet Take 75 mg by mouth daily as needed for heartburn.    Yes Historical Provider, MD  sertraline (ZOLOFT) 50 MG tablet Take 1 tablet (50 mg total) by mouth daily. Patient taking differently: Take 100 mg by mouth daily.  06/01/13  Yes Orlena Sheldon, PA-C  triamcinolone cream (KENALOG) 0.1 % Apply 1 application topically 2 (two) times daily. Patient taking differently: Apply 1 application topically 2 (two) times daily as needed (for inflammation...).  01/19/14  Yes Alycia Rossetti, MD  ciprofloxacin (CIPRO) 500 MG tablet Take 1 tablet (500 mg total) by mouth 2 (two) times daily. Patient not taking: Reported on 03/11/2014 02/18/14   Estill Dooms, NP  nitroGLYCERIN (NITROSTAT) 0.4 MG SL tablet Place 1 tablet (0.4 mg total) under the tongue every 5 (five) minutes as needed for chest pain. 02/19/14   Rogelia Mire, NP  nystatin (MYCOSTATIN) 100000 UNIT/ML suspension Take 5 mLs (500,000 Units total) by mouth 4 (four) times daily. 04/09/14   Lonie Peak Dixon, PA-C  predniSONE (DELTASONE) 20 MG tablet Take 2 daily for 5 days 03/11/14   Orlena Sheldon, PA-C   BP 121/66 mmHg  Pulse 71  Temp(Src)  97.7 F (36.5 C) (Oral)  Resp 20  Ht 5\' 3"  (1.6 m)  Wt 154 lb (69.854 kg)  BMI 27.29 kg/m2  SpO2 98%  LMP 08/31/2010   Physical Exam  CONSTITUTIONAL: Well developed/well nourished HEAD: Normocephalic/atraumatic EYES: EOMI/PERRL ENMT: Mucous membranes moist NECK: supple no meningeal signs SPINE/BACK:entire spine nontender CV: S1/S2 noted, no murmurs/rubs/gallops noted LUNGS: wheezing bilaterally  ABDOMEN: soft, nontender, no rebound or guarding, bowel sounds noted throughout abdomen GU:no cva tenderness NEURO: Pt is awake/alert/appropriate, moves all extremitiesx4.  No facial droop.   EXTREMITIES: pulses normal/equal, full ROM SKIN: warm, color normal PSYCH: no abnormalities of mood noted, alert and oriented to situation ED Course  Procedures  DIAGNOSTIC STUDIES: Oxygen Saturation is 98% on RA, normal by my interpretation.    COORDINATION OF CARE: 11:17 PM Discussed treatment plan which includes to get a urinalysis oder and order breathing treatments with pt. Pt acknowledges and agrees to plan.  1:13 AM Pt improved with neb treatments CXR negative She is well appearing at this time Stable for d/c home Low suspicion for ACS/PE/CHF at this time Labs Review Labs Reviewed  URINALYSIS, [place] (NOT AT [facility]) - Abnormal; Notable for the following:    Hgb urine dipstick TRACE (*)    All other components within normal limits  URINE MICROSCOPIC-ADD ON    Imaging Review Dg Chest 2 View  07/12/2014   CLINICAL DATA:  Productive cough for 1 week.  EXAM: CHEST  2 VIEW  COMPARISON:  Prior study from 04/04/2013.  FINDINGS: Mild hyperinflation. Clear lung fields. Normal cardiomediastinal silhouette. No active infiltrates or failure. No acute osseous findings.  IMPRESSION: No active cardiopulmonary disease.  Stable exam.   Electronically Signed   By: Rolla Flatten M.D.   On: 07/12/2014 00:46     EKG Interpretation   Date/Time:  Sunday July 11 2014 23:25:20  EDT Ventricular Rate:  68 PR Interval:  234 QRS Duration: 90 QT Interval:  396 QTC Calculation: 421 R Axis:   55 Text Interpretation:  Sinus rhythm Prolonged PR interval Low voltage,  precordial leads No significant change since last tracing Confirmed by  Christy Gentles  MD, Elenore Rota ([id number]) on 07/11/2014 11:31:04 PM     Medications  ipratropium-albuterol (DUONEB) 0.5-2.5 (3) MG/3ML nebulizer solution 3 mL (3 mLs Nebulization Given 07/12/14 0001)  albuterol (PROVENTIL) (2.5 MG/3ML) 0.083% nebulizer solution 2.5 mg (2.5 mg Nebulization Given 07/12/14 0001)  predniSONE (DELTASONE) tablet 60 mg (60 mg Oral Given 07/11/14 2341)    MDM   Final diagnoses:  Acute bronchitis, unspecified organism    Nursing notes including past medical history and social history reviewed and considered in documentation xrays/imaging reviewed by myself and considered during evaluation Labs/vital reviewed myself and considered during evaluation    I personally performed the services described in this documentation, which was scribed in my presence. The recorded information has been reviewed and is accurate.      Ripley Fraise, MD 07/12/14 [phone]

## 2014-07-11 NOTE — ED Notes (Signed)
Pt c/o burning with urination for the past few days as well,

## 2014-07-11 NOTE — ED Notes (Signed)
Pt here for generalized body aches, chills, productive cough, sob, and dizziness.

## 2014-07-12 ENCOUNTER — Encounter: Payer: Self-pay | Admitting: Physician Assistant

## 2014-07-12 MED ORDER — PREDNISONE 50 MG PO TABS: ORAL_TABLET | ORAL | Status: DC

## 2014-07-12 NOTE — ED Notes (Signed)
Discharge instructions given, pt demonstrated teach back and verbal understanding. No concerns voiced.  

## 2014-07-12 NOTE — Discharge Instructions (Signed)

## 2014-08-12 ENCOUNTER — Encounter: Payer: Self-pay | Admitting: Physician Assistant

## 2014-11-02 ENCOUNTER — Encounter: Payer: Self-pay | Admitting: Physician Assistant

## 2016-06-19 IMAGING — DX DG CHEST 2V
2 series · 2 of 2 positions shown · non-contrast
Comparison: Prior study from 04/04/2013.

CLINICAL DATA: Productive cough for 1 week.

EXAM:
CHEST  2 VIEW

[chest pa]
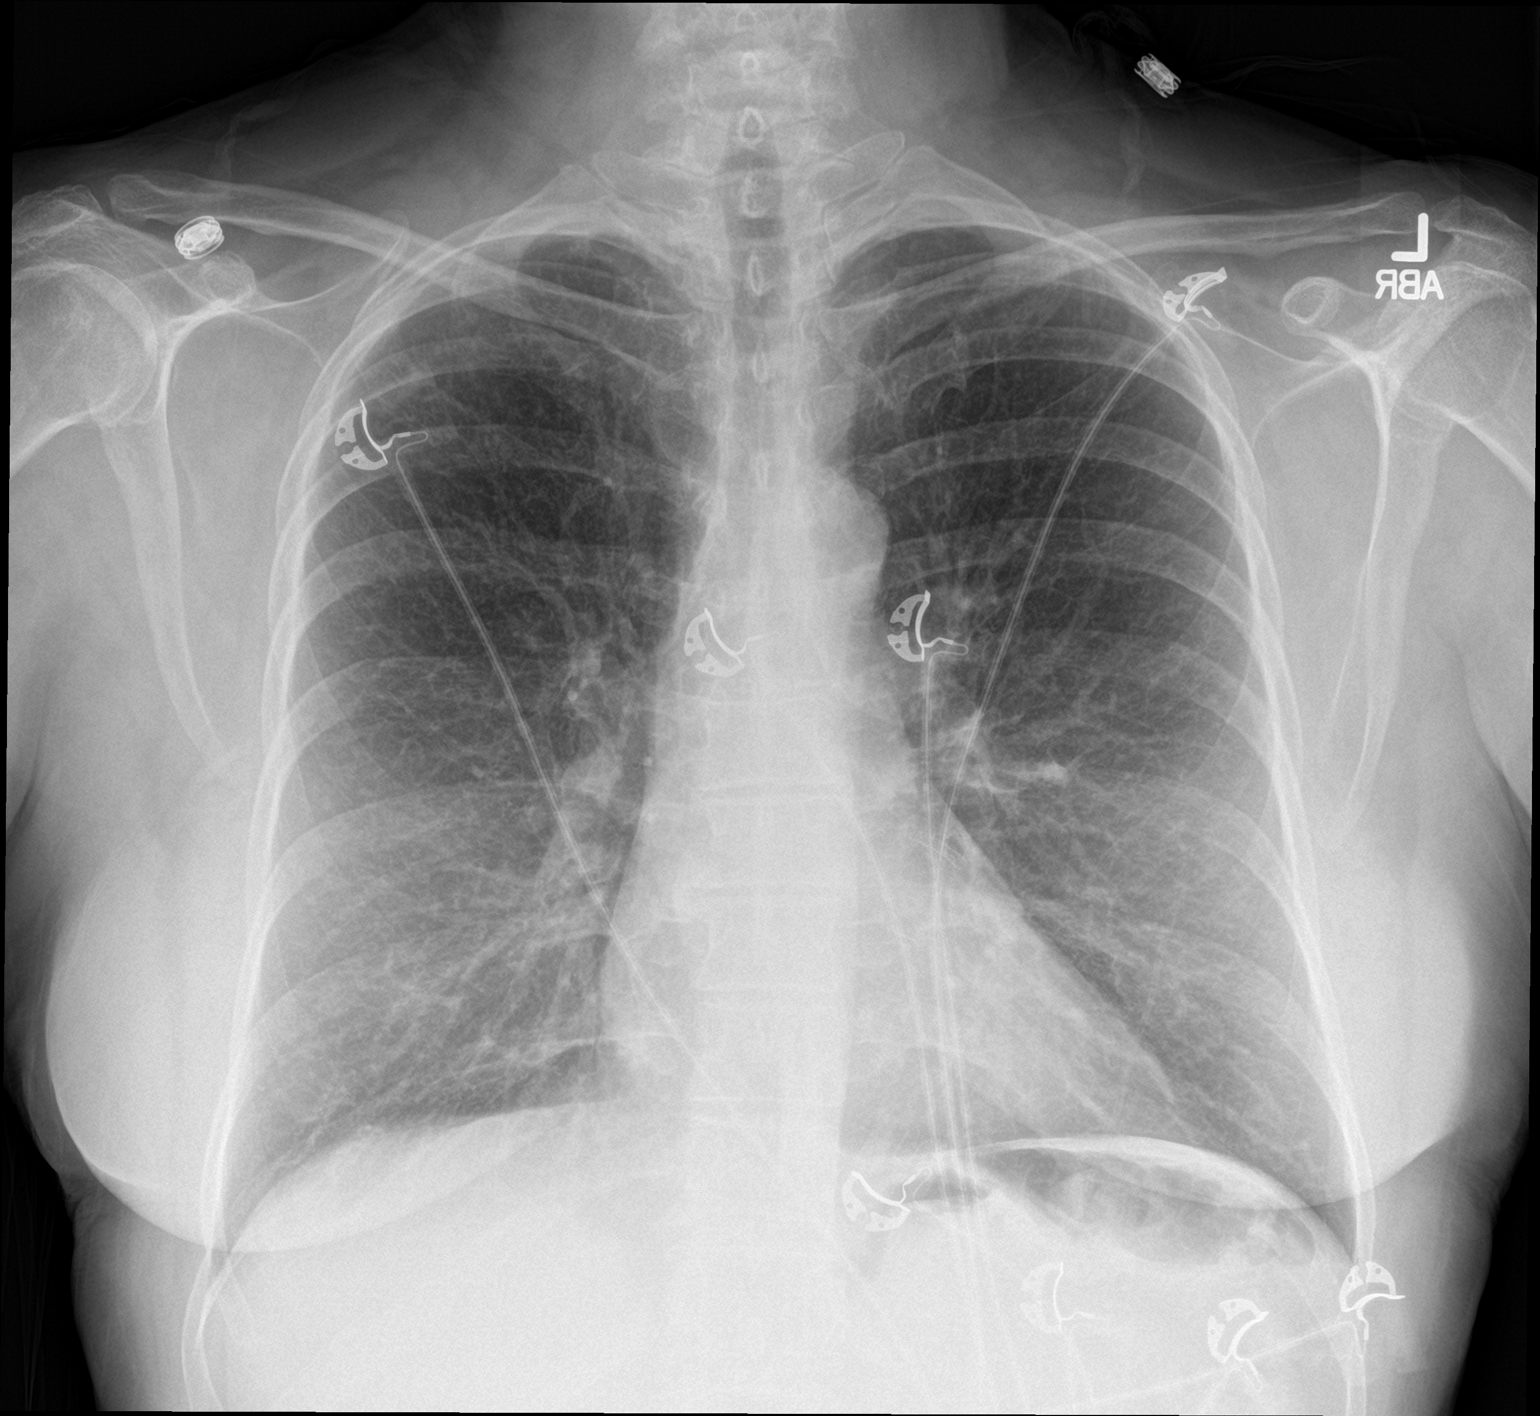

[chest lat]
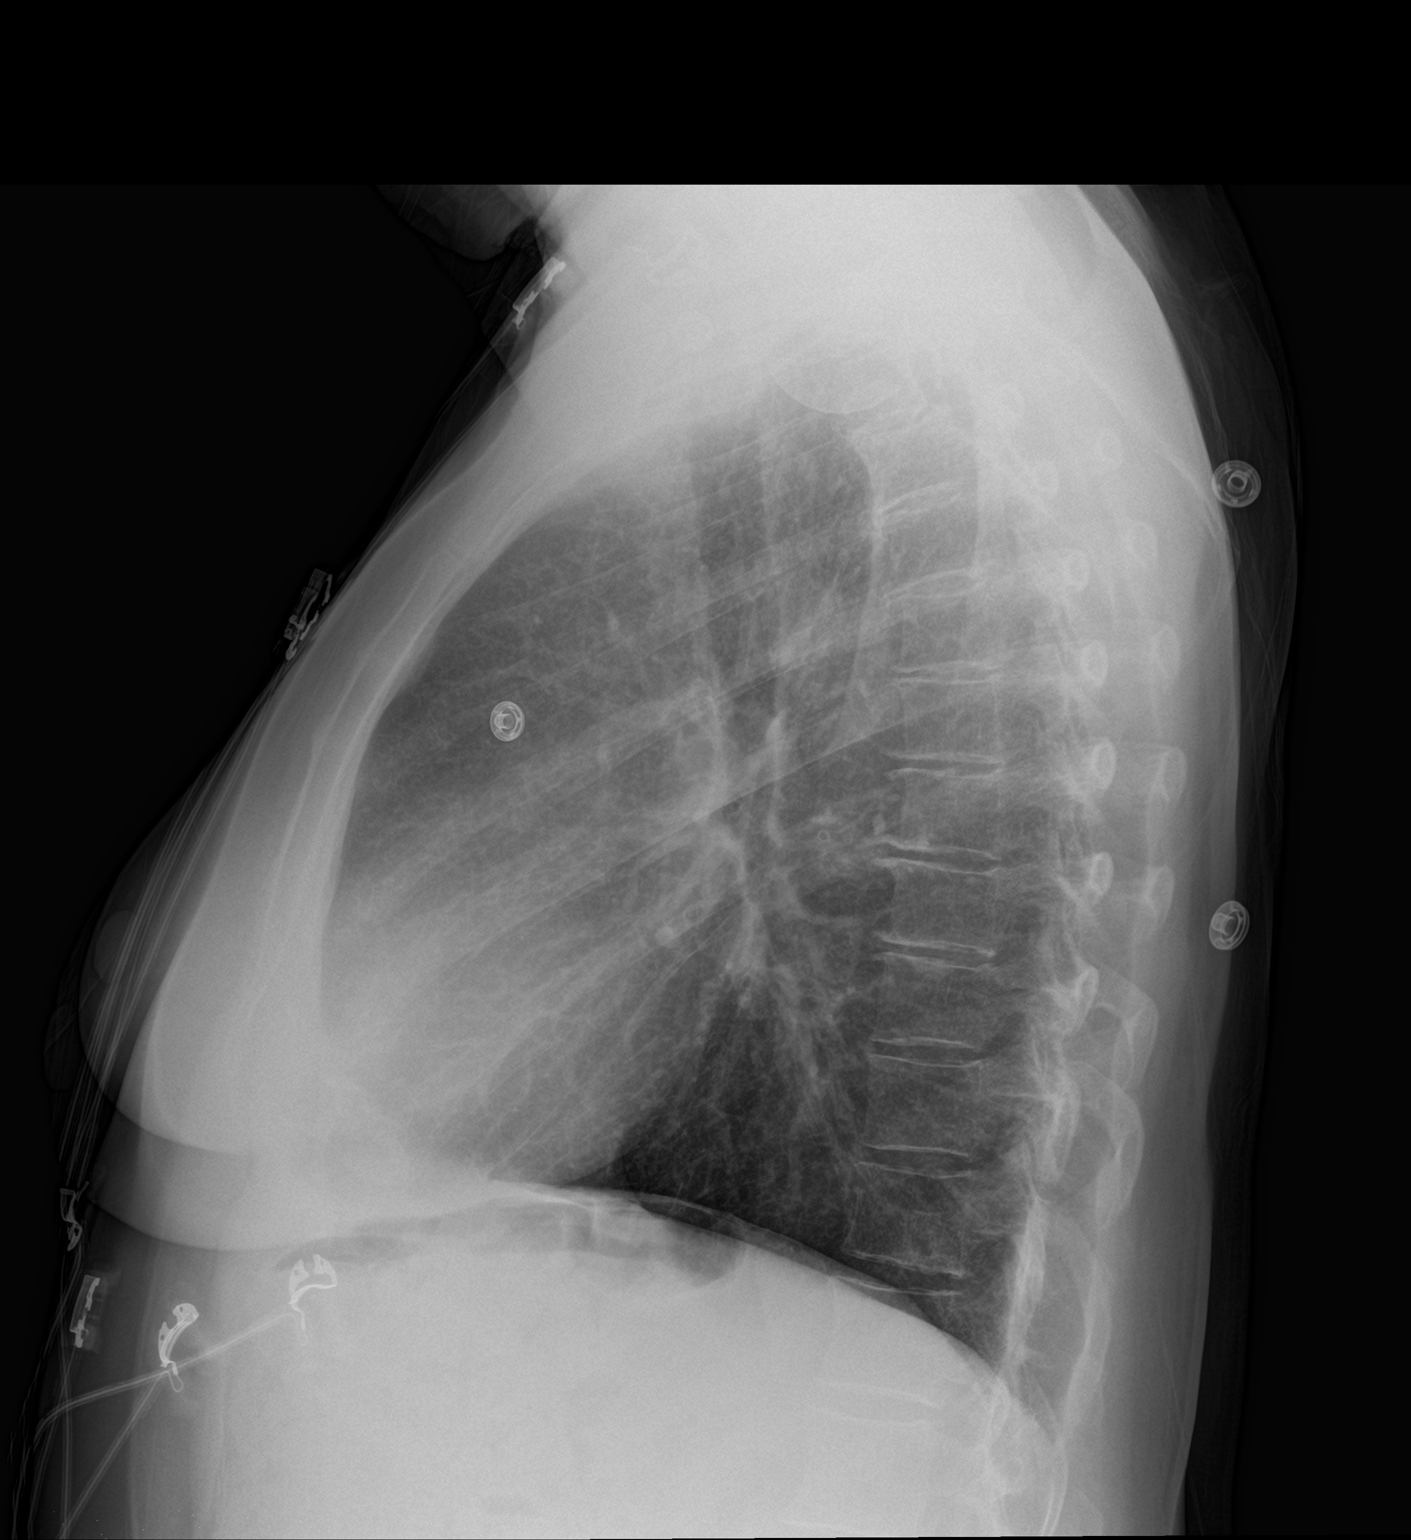

[2 of 2 positions shown; findings below may reference images not displayed]

FINDINGS: Mild hyperinflation. Clear lung fields. Normal cardiomediastinal
silhouette. No active infiltrates or failure. No acute osseous
findings.
IMPRESSION: No active cardiopulmonary disease.  Stable exam.

## 2017-02-03 ENCOUNTER — Encounter (HOSPITAL_COMMUNITY): Payer: Self-pay

## 2017-02-03 ENCOUNTER — Other Ambulatory Visit: Payer: Self-pay

## 2017-02-03 ENCOUNTER — Emergency Department (HOSPITAL_COMMUNITY)
Admission: EM | Admit: 2017-02-03 | Discharge: 2017-02-03 | Disposition: A | Discharge status: Home / Self Care | Payer: Medicaid - Out of State | Attending: Emergency Medicine | Admitting: Emergency Medicine

## 2017-02-03 DIAGNOSIS — R3915 Urgency of urination: Secondary | ICD-10-CM | POA: Insufficient documentation

## 2017-02-03 DIAGNOSIS — H9201 Otalgia, right ear: Secondary | ICD-10-CM | POA: Diagnosis not present

## 2017-02-03 DIAGNOSIS — J3489 Other specified disorders of nose and nasal sinuses: Secondary | ICD-10-CM | POA: Diagnosis not present

## 2017-02-03 DIAGNOSIS — Z79899 Other long term (current) drug therapy: Secondary | ICD-10-CM | POA: Diagnosis not present

## 2017-02-03 DIAGNOSIS — Z955 Presence of coronary angioplasty implant and graft: Secondary | ICD-10-CM | POA: Insufficient documentation

## 2017-02-03 DIAGNOSIS — F17210 Nicotine dependence, cigarettes, uncomplicated: Secondary | ICD-10-CM | POA: Insufficient documentation

## 2017-02-03 DIAGNOSIS — R300 Dysuria: Secondary | ICD-10-CM | POA: Diagnosis present

## 2017-02-03 DIAGNOSIS — Z7982 Long term (current) use of aspirin: Secondary | ICD-10-CM | POA: Diagnosis not present

## 2017-02-03 DIAGNOSIS — N3001 Acute cystitis with hematuria: Secondary | ICD-10-CM

## 2017-02-03 LAB — URINALYSIS, ROUTINE W REFLEX MICROSCOPIC
BILIRUBIN URINE: NEGATIVE
Glucose, UA: NEGATIVE mg/dL
KETONES UR: NEGATIVE mg/dL
Nitrite: POSITIVE — AB
PH: 5 (ref 5.0–8.0)
Protein, ur: 30 mg/dL — AB
SPECIFIC GRAVITY, URINE: 1.015 (ref 1.005–1.030)

## 2017-02-03 MED ORDER — CEPHALEXIN 500 MG PO CAPS: 500.0000 mg | ORAL_CAPSULE | Freq: Four times a day (QID) | ORAL | 0 refills | Status: DC

## 2017-02-03 MED ORDER — CEPHALEXIN 500 MG PO CAPS
500.0000 mg | ORAL_CAPSULE | Freq: Once | ORAL | Status: AC
  Administered 2017-02-03: 500 mg via ORAL
  Filled 2017-02-03: qty 1

## 2017-02-03 MED ORDER — CIPROFLOXACIN-HYDROCORTISONE 0.2-1 % OT SUSP
3.0000 [drp] | Freq: Two times a day (BID) | OTIC | Status: DC
  Filled 2017-02-03: qty 10

## 2017-02-03 MED ORDER — CIPROFLOXACIN-DEXAMETHASONE 0.3-0.1 % OT SUSP
4.0000 [drp] | Freq: Two times a day (BID) | OTIC | Status: DC
  Administered 2017-02-03: 4 [drp] via OTIC
  Filled 2017-02-03: qty 7.5

## 2017-02-03 NOTE — ED Provider Notes (Signed)
[facility] EMERGENCY DEPARTMENT Provider Note   CSN: [id number] Arrival date & time: 02/03/17  1846     History   Chief Complaint Chief Complaint  Patient presents with  . Dysuria  . Otalgia    HPI Melissa Garcia is a 56 y.o. female with past medical history as outlined below presenting with a one-week history of dysuria, describing frequent urination with burning pain along with passage of small quantities of urine.  In addition she endorses urgency, denies hematuria and back or flank pain.  She does have a history of frequent UTIs and has used Azo for the past 2 days which has provided some symptom relief.  She denies nausea or vomiting and has had no fevers or chills.  She also endorses right ear pain along with nasal congestion and clear rhinorrhea.  She is currently staying in a new environment where they heat by burning would and she suspects her symptoms are allergy related.  She has found no alleviators for these symptoms.  She denies drainage from the ear and has had no decreased hearing acuity.  She does endorse sinus congestion and pressure, again no fevers..  The history is provided by the patient.  Otalgia  Associated symptoms include rhinorrhea.    Past Medical History:  Diagnosis Date  . Bipolar disorder ([place])   . COPD (chronic obstructive pulmonary disease) ([place])   . Coronary atherosclerosis of native coronary artery    a. 09/2008 Ant STEMI/VF Arrest/PCI: LM nl, LAD 42m (2.75x23 Xience DES), D1 small, D2 90ost (PTCA), LCX small, min irregs, RI nl, RCA 20-30 diff, EF 30%;  b. 11/2013 Lexi MV: EF 57%, no ischemia.  . Eczema   . Essential hypertension, benign   . Fibroids   . History of Ischemic Cardiomyopathy - resolved    a. 09/2008 EF 30% @ time of MI;  b. 05/2012 Echo: EF 55-60%, no rwma.  . History of UTI 10/29/2013  . Stress incontinence   . Tobacco abuse     Patient Active Problem List   Diagnosis Date Noted  . Coronary atherosclerosis of native  coronary artery   . History of Ischemic Cardiomyopathy - resolved   . Tobacco abuse   . 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 Thrush 01/19/2014  . Incontinence in female 11/25/2013  . Burning with urination 10/29/2013  . Urinary incontinence, mixed 10/29/2013  . Hematuria 10/29/2013  . UTI (lower urinary tract infection) 10/29/2013  . Stress incontinence in female 08/26/2013  . Essential hypertension 08/04/2013  . Dermatitis 07/15/2013  . Endometrial polyp 07/15/2013  . Fibroids 07/08/2013  . Post-menopausal bleeding 07/08/2013  . ASCUS Pap w/ Neg HRHPV 06/30/2013  . Uterine fibroids 06/30/2013  . Postmenopausal bleeding 06/22/2013  . COPD exacerbation ([place]) 05/12/2013  . Stress incontinence   . COPD (chronic obstructive pulmonary disease) ([place])   . Essential hypertension, benign 06/06/2011  . Mixed hyperlipidemia 06/06/2011  . CAD (coronary artery disease), native coronary artery 11/01/2010  . EMPHYSEMA 09/11/2007  . TOBACCO ABUSE 04/25/2007  . BIPOLAR AFFECTIVE DISORDER, DEPRESSED 09/17/2006    Past Surgical History:  Procedure Laterality Date  . APPENDECTOMY    . CATARACT EXTRACTION W/PHACO Left 01/26/2013   Procedure: LEFT EYE CATARACT EXTRACTION PHACO AND INTRAOCULAR LENS PLACEMENT  CDE=6.68;  Surgeon: Tonny Branch, MD;  Location: [facility];  Service: Ophthalmology;  Laterality: Left;  . CATARACT EXTRACTION W/PHACO Right 01/08/2013   Procedure: CATARACT EXTRACTION PHACO AND INTRAOCULAR LENS PLACEMENT ([place]);  Surgeon: Tonny Branch, MD;  Location: [facility];  Service: Ophthalmology;  Laterality: Right;  CDE 9.04  . CORONARY STENT PLACEMENT     heart stent  . EYE SURGERY Bilateral 01/2013   cataracts  . TUBAL LIGATION      OB History    Gravida Para Term Preterm AB Living   5 3 0 0 2 3   SAB TAB Ectopic Multiple Live Births   2 0 0 0 3       Home Medications    Prior to Admission medications   Medication Sig Start Date End Date Taking? Authorizing Provider  albuterol (PROVENTIL  HFA;VENTOLIN HFA) 108 (90 BASE) MCG/ACT inhaler Inhale 2 puffs into the lungs every 4 (four) hours as needed for wheezing. 01/19/14   Alycia Rossetti, MD  aspirin EC 81 MG tablet Take 81 mg by mouth daily.      [provider]  cephALEXin (KEFLEX) 500 MG capsule Take 1 capsule (500 mg total) by mouth 4 (four) times daily. 02/03/17   Evalee Jefferson, PA-C  cetirizine (ZYRTEC) 10 MG tablet Take 10 mg by mouth daily. 09/06/13   Billy Fischer, MD  Clobetasol Prop Emollient Base (CLOBETASOL PROPIONATE E) 0.05 % emollient cream Apply 1 application topically 2 (two) times daily as needed (Rash). To back for rash    [provider]  fluticasone (FLONASE) 50 MCG/ACT nasal spray Place 1 spray into both nostrils daily as needed for allergies or rhinitis.    [provider]  hydrOXYzine (ATARAX/VISTARIL) 10 MG tablet Take 10 mg by mouth 3 (three) times daily as needed for anxiety.    [provider]  ibuprofen (ADVIL,MOTRIN) 800 MG tablet Take 800 mg by mouth every 8 (eight) hours as needed for mild pain.    [provider]  nitroGLYCERIN (NITROSTAT) 0.4 MG SL tablet Place 1 tablet (0.4 mg total) under the tongue every 5 (five) minutes as needed for chest pain. 02/19/14   Theora Gianotti, NP  Omega-3 Fatty Acids (FISH OIL PO) Take 1 capsule by mouth daily.     [provider]  predniSONE (DELTASONE) 50 MG tablet One tablet PO daily for 4 days 07/12/14   Ripley Fraise, MD  ranitidine (ZANTAC) 75 MG tablet Take 75 mg by mouth daily as needed for heartburn.     [provider]  sertraline (ZOLOFT) 50 MG tablet Take 1 tablet (50 mg total) by mouth daily. Patient taking differently: Take 100 mg by mouth daily.  06/01/13   Dena Billet B, PA-C  triamcinolone cream (KENALOG) 0.1 % Apply 1 application topically 2 (two) times daily. Patient taking differently: Apply 1 application topically 2 (two) times daily as needed (for inflammation...).  01/19/14    Alycia Rossetti, MD    Family History Family History  Problem Relation Age of Onset  . Cancer Father        lung  . Cancer Mother        skin  . Stroke Mother   . Hypertension Mother   . Birth defects Sister        born with hole in her heart    Social History Social History   Tobacco Use  . Smoking status: Current Every Day Smoker    Packs/day: 0.50    Years: 30.00    Pack years: 15.00    Types: Cigarettes  . Smokeless tobacco: Never Used  Substance Use Topics  . Alcohol use: No  . Drug use: No     Allergies  Codeine; Depakote [divalproex sodium]; Sulfonamide derivatives; and Toviaz [fesoterodine fumarate er]   Review of Systems Review of Systems  Constitutional: Negative.  Negative for chills and fever.  HENT: Positive for congestion, ear pain and rhinorrhea.   Gastrointestinal: Negative.   Genitourinary: Positive for dysuria, frequency and urgency. Negative for hematuria.  Musculoskeletal: Negative.   Neurological: Negative.      Physical Exam Updated Vital Signs BP (!) 147/88 (BP Location: Right Arm)   Pulse (!) 103   Temp 98.6 F (37 C) (Oral)   Resp 14   Ht 5\' 3"  (1.6 m)   Wt 73 kg (161 lb)   LMP 08/31/2010   SpO2 97%   BMI 28.52 kg/m   Physical Exam  Constitutional: She appears well-developed and well-nourished.  HENT:  Head: Normocephalic and atraumatic.  Right Ear: Hearing, tympanic membrane and external ear normal. No mastoid tenderness.  Left Ear: Hearing, tympanic membrane, external ear and ear canal normal. No mastoid tenderness.  Erythema of right outer ear canal.  There is no drainage and no significant edema.  TM is easily visualized.  There is no pointing or tenting of skin.  Eyes: Conjunctivae are normal.  Neck: Normal range of motion.  Cardiovascular: Normal rate, regular rhythm, normal heart sounds and intact distal pulses.  Pulmonary/Chest: Effort normal and breath sounds normal. She has no wheezes.  Abdominal: Soft.  Bowel sounds are normal. There is no tenderness.  Musculoskeletal: Normal range of motion.  Lymphadenopathy:    She has no cervical adenopathy.    She has no axillary adenopathy.  Neurological: She is alert.  Skin: Skin is warm and dry.  Psychiatric: She has a normal mood and affect.  Nursing note and vitals reviewed.    ED Treatments / Results  Labs (all labs ordered are listed, but only abnormal results are displayed) Labs Reviewed  URINALYSIS, ROUTINE W REFLEX MICROSCOPIC - Abnormal; Notable for the following components:      Result Value   APPearance HAZY (*)    Hgb urine dipstick SMALL (*)    Protein, ur 30 (*)    Nitrite POSITIVE (*)    Leukocytes, UA LARGE (*)    Bacteria, UA MANY (*)    Squamous Epithelial / LPF 0-5 (*)    Non Squamous Epithelial 0-5 (*)    All other components within normal limits  URINE CULTURE    EKG  EKG Interpretation None       Radiology No results found.  Procedures Procedures (including critical care time)  Medications Ordered in ED Medications  ciprofloxacin-hydrocortisone (CIPRO HC OTIC) 0.2-1 % OTIC (EAR) suspension 3 drop (not administered)  cephALEXin (KEFLEX) capsule 500 mg (not administered)     Initial Impression / Assessment and Plan / ED Course  I have reviewed the triage vital signs and the nursing notes.  Pertinent labs & imaging results that were available during my care of the patient were reviewed by me and considered in my medical decision making (see chart for details).     Patient with classic acute cystitis with no signs or symptoms suggesting pyelonephritis.  She was started on Keflex, encouraged increased fluid intake and discussed strict return precautions for worsening symptoms or development of fever vomiting or other complaint.  Her right ear pain and exam suggests possibly in early external otitis as there is erythema in the mid right ear canal, no edema or exudate.  TM is normal in appearance.  She  was given Cipro HC otic for treatment  and advised close follow-up care for any persistent or worsening symptoms.  She was given local referrals as she is new to this area.  Final Clinical Impressions(s) / ED Diagnoses   Final diagnoses:  Acute cystitis with hematuria  Ear pain, right    ED Discharge Orders        Ordered    cephALEXin (KEFLEX) 500 MG capsule  4 times daily     02/03/17 2010       Landis Martins 02/03/17 2014    Davonna Belling, MD 02/04/17 [phone]

## 2017-02-03 NOTE — Discharge Instructions (Signed)
Take your next dose of the Keflex tomorrow morning for treatment of your urinary infection.  Make sure you are drinking plenty of fluids.  You may continue taking your Azo for 1-2 days to better control your symptoms.  Apply 3 drops of the ear medication given to you in your right ear 3 times daily for the next 7 days.

## 2017-02-03 NOTE — ED Triage Notes (Signed)
Reports of dysuria /frequency x1 week. Has been using AZO with no relief. Also complains of right ear pain/nasal drainage.

## 2017-02-06 LAB — URINE CULTURE
Culture: >=100000 — AB
SPECIAL REQUESTS: NORMAL

## 2017-02-07 ENCOUNTER — Telehealth: Payer: Self-pay | Admitting: *Deleted

## 2017-02-07 NOTE — Telephone Encounter (Signed)
Post ED Visit - Positive Culture Follow-up  Culture report reviewed by antimicrobial stewardship pharmacist:  []  Elenor Quinones, Pharm.D. []  Heide Guile, Pharm.D., BCPS AQ-ID []  Parks Neptune, Pharm.D., BCPS []  Alycia Rossetti, Pharm.D., BCPS []  [place], Pharm.D., BCPS, AAHIVP []  Legrand Como, Pharm.D., BCPS, AAHIVP []  Salome Arnt, PharmD, BCPS []  Jalene Mullet, PharmD []  Vincenza Hews, PharmD, BCPS Despina Hidden, PharmD  Positive urine culture Treated with Cephalexin, organism sensitive to the same and no further patient follow-up is required at this time.  Harlon Flor [facility] 02/07/2017, 10:59 AM

## 2017-09-25 ENCOUNTER — Emergency Department (HOSPITAL_COMMUNITY)
Admission: EM | Admit: 2017-09-25 | Discharge: 2017-09-25 | Disposition: A | Discharge status: Home / Self Care | Payer: Medicaid - Out of State | Attending: Emergency Medicine | Admitting: Emergency Medicine

## 2017-09-25 ENCOUNTER — Encounter (HOSPITAL_COMMUNITY): Payer: Self-pay | Admitting: Emergency Medicine

## 2017-09-25 ENCOUNTER — Other Ambulatory Visit: Payer: Self-pay

## 2017-09-25 DIAGNOSIS — J029 Acute pharyngitis, unspecified: Secondary | ICD-10-CM | POA: Diagnosis not present

## 2017-09-25 DIAGNOSIS — J449 Chronic obstructive pulmonary disease, unspecified: Secondary | ICD-10-CM | POA: Diagnosis not present

## 2017-09-25 DIAGNOSIS — H9201 Otalgia, right ear: Secondary | ICD-10-CM | POA: Diagnosis present

## 2017-09-25 DIAGNOSIS — I10 Essential (primary) hypertension: Secondary | ICD-10-CM | POA: Diagnosis not present

## 2017-09-25 DIAGNOSIS — F17210 Nicotine dependence, cigarettes, uncomplicated: Secondary | ICD-10-CM | POA: Diagnosis not present

## 2017-09-25 HISTORY — DX: Streptococcal pharyngitis: J02.0

## 2017-09-25 LAB — GROUP A STREP BY PCR: Group A Strep by PCR: NOT DETECTED

## 2017-09-25 MED ORDER — ALBUTEROL SULFATE HFA 108 (90 BASE) MCG/ACT IN AERS: 1.0000 | INHALATION_SPRAY | Freq: Four times a day (QID) | RESPIRATORY_TRACT | 0 refills | Status: DC | PRN

## 2017-09-25 MED ORDER — DEXAMETHASONE 4 MG PO TABS
10.0000 mg | ORAL_TABLET | Freq: Once | ORAL | Status: AC
  Administered 2017-09-25: 10 mg via ORAL
  Filled 2017-09-25: qty 3

## 2017-09-25 NOTE — ED Provider Notes (Signed)
[facility] EMERGENCY DEPARTMENT Provider Note   CSN: [id number] Arrival date & time: 09/25/17  2004     History   Chief Complaint Chief Complaint  Patient presents with  . Sore Throat  . Otalgia    HPI Melissa Garcia is a 57 y.o. female who presents with ear pain and sore throat. PMH significant for COPD, current smoker, HTN, HLD, bipolar d/o.  The patient states for the past 3 days she has had gradually worsening sore throat and right-sided ear pain.  The pain is worse on the right side of her throat as well and she feels like her anterior neck is slightly swollen on the right side.  She has been staying with her mother but is from [place].  Patient denies fever stating that she never runs fevers even when she was sick with pneumonia.  She denies congestion or runny nose but has an occasional cough and chest congestion.  She is tried warm salt gargles without relief.  She is a smoker.  No chest pain, shortness of breath, or wheezing.  HPI  Past Medical History:  Diagnosis Date  . Bipolar disorder ([place])   . COPD (chronic obstructive pulmonary disease) ([place])   . Coronary atherosclerosis of native coronary artery    a. 09/2008 Ant STEMI/VF Arrest/PCI: LM nl, LAD 18m (2.75x23 Xience DES), D1 small, D2 90ost (PTCA), LCX small, min irregs, RI nl, RCA 20-30 diff, EF 30%;  b. 11/2013 Lexi MV: EF 57%, no ischemia.  . Eczema   . Essential hypertension, benign   . Fibroids   . History of Ischemic Cardiomyopathy - resolved    a. 09/2008 EF 30% @ time of MI;  b. 05/2012 Echo: EF 55-60%, no rwma.  . History of UTI 10/29/2013  . Strep throat   . Stress incontinence   . Tobacco abuse     Patient Active Problem List   Diagnosis Date Noted  . Coronary atherosclerosis of native coronary artery   . History of Ischemic Cardiomyopathy - resolved   . Tobacco abuse   . 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 Thrush 01/19/2014  . Incontinence in female 11/25/2013  . Burning with urination 10/29/2013  .  Urinary incontinence, mixed 10/29/2013  . Hematuria 10/29/2013  . UTI (lower urinary tract infection) 10/29/2013  . Stress incontinence in female 08/26/2013  . Essential hypertension 08/04/2013  . Dermatitis 07/15/2013  . Endometrial polyp 07/15/2013  . Fibroids 07/08/2013  . Post-menopausal bleeding 07/08/2013  . ASCUS Pap w/ Neg HRHPV 06/30/2013  . Uterine fibroids 06/30/2013  . Postmenopausal bleeding 06/22/2013  . COPD exacerbation ([place]) 05/12/2013  . Stress incontinence   . COPD (chronic obstructive pulmonary disease) ([place])   . Essential hypertension, benign 06/06/2011  . Mixed hyperlipidemia 06/06/2011  . CAD (coronary artery disease), native coronary artery 11/01/2010  . EMPHYSEMA 09/11/2007  . TOBACCO ABUSE 04/25/2007  . BIPOLAR AFFECTIVE DISORDER, DEPRESSED 09/17/2006    Past Surgical History:  Procedure Laterality Date  . APPENDECTOMY    . CATARACT EXTRACTION W/PHACO Left 01/26/2013   Procedure: LEFT EYE CATARACT EXTRACTION PHACO AND INTRAOCULAR LENS PLACEMENT  CDE=6.68;  Surgeon: Tonny Branch, MD;  Location: [facility];  Service: Ophthalmology;  Laterality: Left;  . CATARACT EXTRACTION W/PHACO Right 01/08/2013   Procedure: CATARACT EXTRACTION PHACO AND INTRAOCULAR LENS PLACEMENT ([place]);  Surgeon: Tonny Branch, MD;  Location: [facility];  Service: Ophthalmology;  Laterality: Right;  CDE 9.04  . CORONARY STENT PLACEMENT     heart stent  . EYE SURGERY  Bilateral 01/2013   cataracts  . TUBAL LIGATION       OB History    Gravida  5   Para  3   Term  0   Preterm  0   AB  2   Living  3     SAB  2   TAB  0   Ectopic  0   Multiple  0   Live Births  3            Home Medications    Prior to Admission medications   Medication Sig Start Date End Date Taking? Authorizing Provider  albuterol (PROVENTIL HFA;VENTOLIN HFA) 108 (90 BASE) MCG/ACT inhaler Inhale 2 puffs into the lungs every 4 (four) hours as needed for wheezing. 01/19/14   Alycia Rossetti, MD    aspirin EC 81 MG tablet Take 81 mg by mouth daily.      [provider]  cephALEXin (KEFLEX) 500 MG capsule Take 1 capsule (500 mg total) by mouth 4 (four) times daily. 02/03/17   Evalee Jefferson, PA-C  cetirizine (ZYRTEC) 10 MG tablet Take 10 mg by mouth daily. 09/06/13   Billy Fischer, MD  Clobetasol Prop Emollient Base (CLOBETASOL PROPIONATE E) 0.05 % emollient cream Apply 1 application topically 2 (two) times daily as needed (Rash). To back for rash    [provider]  fluticasone (FLONASE) 50 MCG/ACT nasal spray Place 1 spray into both nostrils daily as needed for allergies or rhinitis.    [provider]  hydrOXYzine (ATARAX/VISTARIL) 10 MG tablet Take 10 mg by mouth 3 (three) times daily as needed for anxiety.    [provider]  ibuprofen (ADVIL,MOTRIN) 800 MG tablet Take 800 mg by mouth every 8 (eight) hours as needed for mild pain.    [provider]  nitroGLYCERIN (NITROSTAT) 0.4 MG SL tablet Place 1 tablet (0.4 mg total) under the tongue every 5 (five) minutes as needed for chest pain. 02/19/14   Theora Gianotti, NP  Omega-3 Fatty Acids (FISH OIL PO) Take 1 capsule by mouth daily.     [provider]  predniSONE (DELTASONE) 50 MG tablet One tablet PO daily for 4 days 07/12/14   Ripley Fraise, MD  ranitidine (ZANTAC) 75 MG tablet Take 75 mg by mouth daily as needed for heartburn.     [provider]  sertraline (ZOLOFT) 50 MG tablet Take 1 tablet (50 mg total) by mouth daily. Patient taking differently: Take 100 mg by mouth daily.  06/01/13   Dena Billet B, PA-C  triamcinolone cream (KENALOG) 0.1 % Apply 1 application topically 2 (two) times daily. Patient taking differently: Apply 1 application topically 2 (two) times daily as needed (for inflammation...).  01/19/14   Alycia Rossetti, MD    Family History Family History  Problem Relation Age of Onset  . Cancer Father        lung  . Cancer Mother        skin  .  Stroke Mother   . Hypertension Mother   . Birth defects Sister        born with hole in her heart    Social History Social History   Tobacco Use  . Smoking status: Current Every Day Smoker    Packs/day: 0.50    Years: 30.00    Pack years: 15.00    Types: Cigarettes  . Smokeless tobacco: Never Used  Substance Use Topics  . Alcohol use: No  . Drug  use: No     Allergies   Codeine; Depakote [divalproex sodium]; Sulfonamide derivatives; and Toviaz [fesoterodine fumarate er]   Review of Systems Review of Systems  Constitutional: Negative for fever.  HENT: Positive for ear pain and sore throat. Negative for congestion and sinus pain.   Respiratory: Positive for cough. Negative for shortness of breath and wheezing.      Physical Exam Updated Vital Signs BP (!) 159/74 (BP Location: Right Arm)   Pulse 73   Temp 97.9 F (36.6 C) (Oral)   Resp 19   LMP 08/31/2010   SpO2 98%   Physical Exam  Constitutional: She is oriented to person, place, and time. She appears well-developed and well-nourished. No distress.  Calm, cooperative, well appearing  HENT:  Head: Normocephalic and atraumatic.  Right Ear: Hearing, tympanic membrane, external ear and ear canal normal.  Left Ear: Hearing, tympanic membrane, external ear and ear canal normal.  Nose: Nose normal.  Mouth/Throat: Uvula is midline and mucous membranes are normal. Posterior oropharyngeal erythema present. No oropharyngeal exudate, posterior oropharyngeal edema or tonsillar abscesses.  Eyes: Pupils are equal, round, and reactive to light. Conjunctivae are normal. Right eye exhibits no discharge. Left eye exhibits no discharge. No scleral icterus.  Neck: Normal range of motion.  Cardiovascular: Normal rate and regular rhythm.  Pulmonary/Chest: Effort normal and breath sounds normal. No respiratory distress.  Abdominal: She exhibits no distension.  Neurological: She is alert and oriented to person, place, and time.  Skin:  Skin is warm and dry.  Psychiatric: She has a normal mood and affect. Her behavior is normal.  Nursing note and vitals reviewed.    ED Treatments / Results  Labs (all labs ordered are listed, but only abnormal results are displayed) Labs Reviewed  GROUP A STREP BY PCR    EKG None  Radiology No results found.  Procedures Procedures (including critical care time)  Medications Ordered in ED Medications - No data to display   Initial Impression / Assessment and Plan / ED Course  I have reviewed the triage vital signs and the nursing notes.  Pertinent labs & imaging results that were available during my care of the patient were reviewed by me and considered in my medical decision making (see chart for details).  57 year old female presents with sore throat and ear pain.  Is mildly hypertensive but otherwise vital signs are normal.  On exam she is overall well-appearing.  She has erythema of the posterior oropharynx but otherwise her ENT exam is normal.  Likely her ear pain is due to radiation from her throat.  No signs of deep space infection at this time.  She has had a mild cough as well but is afebrile and not hypoxic.  Will defer imaging at this time.  She was given a dose of Decadron.  Strep test was negative.  Results were discussed with the patient and she was advised to follow-up with her primary care provider.  She was given a refill of her inhaler and advised supportive care..  Final Clinical Impressions(s) / ED Diagnoses   Final diagnoses:  Acute pharyngitis, unspecified etiology    ED Discharge Orders    None       Recardo Evangelist, PA-C 09/26/17 0036    Francine Graven, DO 09/28/17 1233

## 2017-09-25 NOTE — ED Triage Notes (Signed)
Pt c/o sore thraot and right ear pain x 3 days. Nad. Throat swollen and red

## 2017-09-25 NOTE — Discharge Instructions (Signed)
Continue over the counter remedies for sore throat Please return if worsening

## 2017-10-21 ENCOUNTER — Emergency Department (HOSPITAL_COMMUNITY)
Admission: EM | Admit: 2017-10-21 | Discharge: 2017-10-21 | Discharge status: Against Medical Advice | Payer: Medicaid - Out of State

## 2017-10-21 NOTE — ED Notes (Signed)
Patient stated she was going somewhere else.

## 2018-09-08 ENCOUNTER — Ambulatory Visit
Admission: EM | Admit: 2018-09-08 | Discharge: 2018-09-08 | Disposition: A | Discharge status: Home / Self Care | Payer: Medicaid Other | Attending: Emergency Medicine | Admitting: Emergency Medicine

## 2018-09-08 ENCOUNTER — Other Ambulatory Visit: Payer: Self-pay

## 2018-09-08 ENCOUNTER — Ambulatory Visit (INDEPENDENT_AMBULATORY_CARE_PROVIDER_SITE_OTHER): Payer: Medicaid Other

## 2018-09-08 DIAGNOSIS — R05 Cough: Secondary | ICD-10-CM | POA: Diagnosis not present

## 2018-09-08 DIAGNOSIS — Z20822 Contact with and (suspected) exposure to covid-19: Secondary | ICD-10-CM

## 2018-09-08 DIAGNOSIS — J029 Acute pharyngitis, unspecified: Secondary | ICD-10-CM

## 2018-09-08 DIAGNOSIS — J069 Acute upper respiratory infection, unspecified: Secondary | ICD-10-CM | POA: Diagnosis not present

## 2018-09-08 DIAGNOSIS — R1110 Vomiting, unspecified: Secondary | ICD-10-CM | POA: Diagnosis not present

## 2018-09-08 DIAGNOSIS — R0789 Other chest pain: Secondary | ICD-10-CM

## 2018-09-08 DIAGNOSIS — F17210 Nicotine dependence, cigarettes, uncomplicated: Secondary | ICD-10-CM

## 2018-09-08 DIAGNOSIS — Z20828 Contact with and (suspected) exposure to other viral communicable diseases: Secondary | ICD-10-CM

## 2018-09-08 DIAGNOSIS — R0981 Nasal congestion: Secondary | ICD-10-CM

## 2018-09-08 MED ORDER — FLUTICASONE PROPIONATE 50 MCG/ACT NA SUSP: 2.0000 | Freq: Every day | NASAL | 0 refills | Status: DC

## 2018-09-08 MED ORDER — CETIRIZINE HCL 10 MG PO TABS: 10.0000 mg | ORAL_TABLET | Freq: Every day | ORAL | 0 refills | Status: DC

## 2018-09-08 MED ORDER — BENZONATATE 100 MG PO CAPS: 100.0000 mg | ORAL_CAPSULE | Freq: Three times a day (TID) | ORAL | 0 refills | Status: DC

## 2018-09-08 NOTE — ED Triage Notes (Signed)
Pt had pneumonia 2 months ago and states she feels as if she hasn't gotten over it, pt also describes sinus pressure and cough

## 2018-09-08 NOTE — ED Provider Notes (Signed)
[facility]   [id number] 09/08/18 Arrival Time: 6546  Cc: Viral illness  SUBJECTIVE:  Melissa Garcia is a 58 y.o. female hx significant for bipolar disorder, COPD, CAD with stent placement, and HTN, who presents with feeling "hot," 1-2 episodes of vomiting, ear ache, sore throat, productive cough with yellow sputum, head congestion, and LAD x 4 days.  Denies sick exposure to COVID, flu or strep.  Denies recent travel.  Has tried ibuprofen with minimal relief.  Denies aggravating factors.  Reports diagnosed with PNA, acute bronchitis, and sinus infection approximately 2 months ago.  Prescribed antibiotics at that time, and symptoms improved. Reports chills, night sweats, fatigue, and also mentions an episodes of sharp RT sided chest discomfort that occurred 4 days ago, now resolved.  Attributes this chest discomfort to indigestion.  Denies fever, wheezing, chest pain, nausea, abdominal pain, changes in bowel or bladder habits.    Admits to smoking 1/2 PPD x 20 years.    ROS: As per HPI.  All other pertinent ROS negative.     Past Medical History:  Diagnosis Date  . Bipolar disorder ([place])   . COPD (chronic obstructive pulmonary disease) ([place])   . Coronary atherosclerosis of native coronary artery    a. 09/2008 Ant STEMI/VF Arrest/PCI: LM nl, LAD 42m (2.75x23 Xience DES), D1 small, D2 90ost (PTCA), LCX small, min irregs, RI nl, RCA 20-30 diff, EF 30%;  b. 11/2013 Lexi MV: EF 57%, no ischemia.  . Eczema   . Essential hypertension, benign   . Fibroids   . History of Ischemic Cardiomyopathy - resolved    a. 09/2008 EF 30% @ time of MI;  b. 05/2012 Echo: EF 55-60%, no rwma.  . History of UTI 10/29/2013  . Strep throat   . Stress incontinence   . Tobacco abuse    Past Surgical History:  Procedure Laterality Date  . APPENDECTOMY    . CATARACT EXTRACTION W/PHACO Left 01/26/2013   Procedure: LEFT EYE CATARACT EXTRACTION PHACO AND INTRAOCULAR LENS PLACEMENT  CDE=6.68;  Surgeon: Tonny Branch, MD;  Location: [facility];  Service: Ophthalmology;  Laterality: Left;  . CATARACT EXTRACTION W/PHACO Right 01/08/2013   Procedure: CATARACT EXTRACTION PHACO AND INTRAOCULAR LENS PLACEMENT ([place]);  Surgeon: Tonny Branch, MD;  Location: [facility];  Service: Ophthalmology;  Laterality: Right;  CDE 9.04  . CORONARY STENT PLACEMENT     heart stent  . EYE SURGERY Bilateral 01/2013   cataracts  . TUBAL LIGATION     Allergies  Allergen Reactions  . Codeine Nausea And Vomiting    Vicodin/Percocet Specified= excessive vomiting  . Depakote [Divalproex Sodium] Other (See Comments)    "Mood Swings--Felt like out of my mind"  . Sulfonamide Derivatives Hives  . Toviaz [Fesoterodine Fumarate Er]     Extremely dry mouth, felt hot like hotflashes. Felt dehydrated.   No current facility-administered medications on file prior to encounter.    Current Outpatient Medications on File Prior to Encounter  Medication Sig Dispense Refill  . aspirin EC 81 MG tablet Take 81 mg by mouth daily.      . Clobetasol Prop Emollient Base (CLOBETASOL PROPIONATE E) 0.05 % emollient cream Apply 1 application topically 2 (two) times daily as needed (Rash). To back for rash    . fluticasone (FLONASE) 50 MCG/ACT nasal spray Place 1 spray into both nostrils daily as needed for allergies or rhinitis.    . hydrOXYzine (ATARAX/VISTARIL) 10 MG tablet Take 10 mg by mouth 3 (three) times daily  as needed for anxiety.    Marland Kitchen ibuprofen (ADVIL,MOTRIN) 800 MG tablet Take 800 mg by mouth every 8 (eight) hours as needed for mild pain.    . nitroGLYCERIN (NITROSTAT) 0.4 MG SL tablet Place 1 tablet (0.4 mg total) under the tongue every 5 (five) minutes as needed for chest pain. 25 tablet 3  . Omega-3 Fatty Acids (FISH OIL PO) Take 1 capsule by mouth daily.     . predniSONE (DELTASONE) 50 MG tablet One tablet PO daily for 4 days 4 tablet 0  . ranitidine (ZANTAC) 75 MG tablet Take 75 mg by mouth daily as needed for heartburn.     . sertraline  (ZOLOFT) 50 MG tablet Take 1 tablet (50 mg total) by mouth daily. (Patient taking differently: Take 100 mg by mouth daily. ) 30 tablet 1  . triamcinolone cream (KENALOG) 0.1 % Apply 1 application topically 2 (two) times daily. (Patient taking differently: Apply 1 application topically 2 (two) times daily as needed (for inflammation...). ) 45 g 0  . [DISCONTINUED] albuterol (PROVENTIL HFA;VENTOLIN HFA) 108 (90 Base) MCG/ACT inhaler Inhale 1-2 puffs into the lungs every 6 (six) hours as needed for wheezing or shortness of breath. 1 Inhaler 0    Social History   Socioeconomic History  . Marital status: Single    Spouse name: Not on file  . Number of children: Not on file  . Years of education: Not on file  . Highest education level: Not on file  Occupational History  . Occupation: Disabled  Social Needs  . Financial resource strain: Not on file  . Food insecurity    Worry: Not on file    Inability: Not on file  . Transportation needs    Medical: Not on file    Non-medical: Not on file  Tobacco Use  . Smoking status: Current Every Day Smoker    Packs/day: 0.50    Years: 30.00    Pack years: 15.00    Types: Cigarettes  . Smokeless tobacco: Never Used  Substance and Sexual Activity  . Alcohol use: No  . Drug use: No  . Sexual activity: Yes    Birth control/protection: Post-menopausal  Lifestyle  . Physical activity    Days per week: Not on file    Minutes per session: Not on file  . Stress: Not on file  Relationships  . Social Herbalist on phone: Not on file    Gets together: Not on file    Attends religious service: Not on file    Active member of club or organization: Not on file    Attends meetings of clubs or organizations: Not on file    Relationship status: Not on file  . Intimate partner violence    Fear of current or ex partner: Not on file    Emotionally abused: Not on file    Physically abused: Not on file    Forced sexual activity: Not on file   Other Topics Concern  . Not on file  Social History Narrative  . Not on file   Family History  Problem Relation Age of Onset  . Cancer Father        lung  . Cancer Mother        skin  . Stroke Mother   . Hypertension Mother   . Birth defects Sister        born with hole in her heart     OBJECTIVE:  Vitals:   09/08/18  1622  BP: 129/87  Pulse: 84  Resp: 18  Temp: 98.9 F (37.2 C)  SpO2: 95%     General appearance: Alert, appears mildly fatigued, but nontoxic; speaking in full sentences without difficulty HEENT:NCAT; Ears: EACs clear, TMs pearly gray; Eyes: PERRL.  EOM grossly intact. Nose: nares patent without rhinorrhea; Throat: tonsils nonerythematous or enlarged, uvula midline  Neck: supple without LAD Lungs: diffuse wheezes throughout;  normal respiratory effort; mild cough present Heart: regular rate and rhythm.  Skin: warm and dry Psychological: alert and cooperative; normal mood and affect  DIAGNOSTIC STUDIES:  Dg Chest 2 View  Result Date: 09/08/2018 CLINICAL DATA:  Pneumonia for 3 months. EXAM: CHEST - 2 VIEW COMPARISON:  October 21, 2017 FINDINGS: The heart size and mediastinal contours are within normal limits. Both lungs are clear. The visualized skeletal structures are stable. IMPRESSION: No acute cardiopulmonary disease identified. Electronically Signed   By: Abelardo Diesel M.D.   On: 09/08/2018 16:52    My interpretation: X-rays negative for cardiopulmonary disease  I have reviewed the x-rays myself and the radiologist interpretation. I am in agreement with the radiologist interpretation.     EKG:  EKG normal sinus rhythm without ST elevations, depressions, or prolonged PR interval.  No narrowing or widening of the QRS complexes.  No obvious changes from past EKG.     ASSESSMENT & PLAN:  1. Viral URI   2. Suspected Covid-19 Virus Infection     Meds ordered this encounter  Medications  . benzonatate (TESSALON) 100 MG capsule    Sig: Take 1  capsule (100 mg total) by mouth every 8 (eight) hours.    Dispense:  21 capsule    Refill:  0    Order Specific Question:   Supervising Provider    Answer:   Raylene Everts [[id number]]  . fluticasone (FLONASE) 50 MCG/ACT nasal spray    Sig: Place 2 sprays into both nostrils daily.    Dispense:  16 g    Refill:  0    Order Specific Question:   Supervising Provider    Answer:   Raylene Everts [[id number]]  . cetirizine (ZYRTEC) 10 MG tablet    Sig: Take 1 tablet (10 mg total) by mouth daily.    Dispense:  30 tablet    Refill:  0    Order Specific Question:   Supervising Provider    Answer:   Raylene Everts [[id number]]    Orders Placed This Encounter  Procedures  . Novel Coronavirus, NAA ([facility])    Standing Status:   Standing    Number of Occurrences:   1  . DG Chest 2 View    Standing Status:   Standing    Number of Occurrences:   1    Order Specific Question:   Reason for Exam (SYMPTOM  OR DIAGNOSIS REQUIRED)    Answer:   sob  . ED EKG    Standing Status:   Standing    Number of Occurrences:   1    Order Specific Question:   Reason for Exam    Answer:   Chest Pain    EKG normal Chest x-ray unremarkable for cardiopulmonary disease COVID testing ordered.  IT takes approximately 5-7 days for test results  In the meantime: You should remain isolated in your home for 10 days from symptom onset AND greater than 72 hours after symptoms resolution (absence of fever without the use of fever-reducing medication and improvement in respiratory symptoms), whichever  is longer Get plenty of rest and push fluids Tessalon Perles prescribed for cough Zyrtec prescribed for nasal congestion, runny nose, and/or sore throat Flonase prescribed for nasal congestion and runny nose Use medications daily for symptom relief Use OTC medications like ibuprofen or tylenol as needed fever or pain Call or go to the ED if you have any new or worsening symptoms such as fever, worsening cough,  shortness of breath, chest tightness, chest pain, turning blue, changes in mental status, etc...   Reviewed expectations re: course of current medical issues. Questions answered. Outlined signs and symptoms indicating need for more acute intervention. Patient verbalized understanding. After Visit Summary given.          Lestine Box, PA-C 09/08/18 2118

## 2018-09-08 NOTE — Discharge Instructions (Addendum)
EKG normal Chest x-ray unremarkable for cardiopulmonary disease COVID testing ordered.  IT takes approximately 5-7 days for test results  In the meantime: You should remain isolated in your home for 10 days from symptom onset AND greater than 72 hours after symptoms resolution (absence of fever without the use of fever-reducing medication and improvement in respiratory symptoms), whichever is longer Get plenty of rest and push fluids Tessalon Perles prescribed for cough Zyrtec-D prescribed for nasal congestion, runny nose, and/or sore throat Flonase prescribed for nasal congestion and runny nose Use medications daily for symptom relief Use OTC medications like ibuprofen or tylenol as needed fever or pain Call or go to the ED if you have any new or worsening symptoms such as fever, worsening cough, shortness of breath, chest tightness, chest pain, turning blue, changes in mental status, etc..Marland Kitchen

## 2018-09-09 LAB — NOVEL CORONAVIRUS, NAA: SARS-CoV-2, NAA: NOT DETECTED

## 2018-09-10 ENCOUNTER — Telehealth: Payer: Self-pay | Admitting: Emergency Medicine

## 2018-09-10 ENCOUNTER — Encounter (HOSPITAL_COMMUNITY): Payer: Self-pay

## 2018-09-10 NOTE — Telephone Encounter (Signed)
Spoke with patient today regarding COVID 19 results.  COVID 19 results negative. States overall symptoms have improved. However, patient mentions some increased swelling and discomfort over her RT clavicle and RT side of neck. Hx significant for smoking 1/2 PPD x 20 years. Also had stent placement years ago. CXR negative for cardiopulmonary disease on 09/08/2018.  Patient recently moved here from [place] and does not have a PCP.  I scheduled an appointment with [facility] for patient.  Patient has appointment on 09/24/2018 at 10:00 AM.  Given strict return and ED precautions.  Instructed patient to go to the ED if she experiences worsening pain, SOB, chest pain, chest pressure, trouble breathing/ swallowing, etc..Marland Kitchen

## 2018-09-24 ENCOUNTER — Other Ambulatory Visit: Payer: Self-pay

## 2018-09-24 ENCOUNTER — Ambulatory Visit (INDEPENDENT_AMBULATORY_CARE_PROVIDER_SITE_OTHER): Payer: Medicaid Other | Admitting: Family Medicine

## 2018-09-24 ENCOUNTER — Encounter: Payer: Self-pay | Admitting: Family Medicine

## 2018-09-24 VITALS — BP 126/81 | HR 78 | Temp 98.8°F | Ht 63.0 in | Wt 162.4 lb

## 2018-09-24 DIAGNOSIS — M25511 Pain in right shoulder: Secondary | ICD-10-CM

## 2018-09-24 DIAGNOSIS — K219 Gastro-esophageal reflux disease without esophagitis: Secondary | ICD-10-CM

## 2018-09-24 DIAGNOSIS — G5692 Unspecified mononeuropathy of left upper limb: Secondary | ICD-10-CM

## 2018-09-24 DIAGNOSIS — H6692 Otitis media, unspecified, left ear: Secondary | ICD-10-CM | POA: Diagnosis not present

## 2018-09-24 DIAGNOSIS — Z1231 Encounter for screening mammogram for malignant neoplasm of breast: Secondary | ICD-10-CM

## 2018-09-24 DIAGNOSIS — J449 Chronic obstructive pulmonary disease, unspecified: Secondary | ICD-10-CM | POA: Diagnosis not present

## 2018-09-24 DIAGNOSIS — G8929 Other chronic pain: Secondary | ICD-10-CM

## 2018-09-24 DIAGNOSIS — R221 Localized swelling, mass and lump, neck: Secondary | ICD-10-CM

## 2018-09-24 DIAGNOSIS — F17200 Nicotine dependence, unspecified, uncomplicated: Secondary | ICD-10-CM

## 2018-09-24 MED ORDER — ALBUTEROL SULFATE HFA 108 (90 BASE) MCG/ACT IN AERS: 2.0000 | INHALATION_SPRAY | Freq: Four times a day (QID) | RESPIRATORY_TRACT | 1 refills | Status: DC | PRN

## 2018-09-24 MED ORDER — OMEPRAZOLE 40 MG PO CPDR: 40.0000 mg | DELAYED_RELEASE_CAPSULE | Freq: Every day | ORAL | 3 refills | Status: DC

## 2018-09-24 MED ORDER — BUDESONIDE-FORMOTEROL FUMARATE 80-4.5 MCG/ACT IN AERO: 2.0000 | INHALATION_SPRAY | Freq: Two times a day (BID) | RESPIRATORY_TRACT | 3 refills | Status: DC

## 2018-09-24 MED ORDER — AZITHROMYCIN 250 MG PO TABS: ORAL_TABLET | ORAL | 0 refills | Status: DC

## 2018-09-24 NOTE — Patient Instructions (Addendum)
Sign for paperwork- colonoscopy-[place]  Take albuterol prior to symbicort in the morning and albuterol prior to symbicort in the evening and as needed. Brush teeth after using inhalers  Stop smoking  Start omeprazole -1 hour prior to largest meal  Take zithromax -starting today for ear infection and chest congestion  We will call on scheduled appt for neck ultrasound and cervical spine film  We will call on orthopedic appt  Mammogram and DEXA  Flu shot when available

## 2018-09-24 NOTE — Progress Notes (Signed)
New Patient Office Visit  Subjective:  Patient ID: Melissa Garcia, female    DOB: 10/30/1960  Age: 58 y.o. MRN: [id number]  CC:  Chief Complaint  Patient presents with  . New Patient (Initial Visit)  . Ear Pain    left ear pain  . Shoulder Pain    right neck & shoulder pain   Chest-xray 8/20 IMPRESSION: No acute cardiopulmonary disease identified. CAD-stress test 19-nromal, ecg 8/20  ABDOMINAL CT-9/15  IMPRESSION: 1. Mild circumferential wall thickening and stranding about the descending colon in the left mid abdomen, suggesting mild colitis. No evidence of perforation or other complication. 2. No other acute intra-abdominal or pelvic process identified. Colonoscopy in [place]-2018-colon polyps  HPI Melissa Garcia presents for COPD-seen at ER-no inhalers currently Left ear pain-seen in ER-, no fever. Albuterol in the past  See mental health provider-seen by tele-health-8/20-no current counseling Bi-polar zoloft 50mg  daily added zyprexa-previously on 100mg  zoloft Follow up in 2 months   Past Medical History:  Diagnosis Date  . Bipolar disorder ([place])   . COPD (chronic obstructive pulmonary disease) ([place])   . Coronary atherosclerosis of native coronary artery    a. 09/2008 Ant STEMI/VF Arrest/PCI: LM nl, LAD 12m (2.75x23 Xience DES), D1 small, D2 90ost (PTCA), LCX small, min irregs, RI nl, RCA 20-30 diff, EF 30%;  b. 11/2013 Lexi MV: EF 57%, no ischemia.  . Eczema   . Essential hypertension, benign   . Fibroids   . History of Ischemic Cardiomyopathy - resolved    a. 09/2008 EF 30% @ time of MI;  b. 05/2012 Echo: EF 55-60%, no rwma.  . History of UTI 10/29/2013  . Strep throat   . Stress incontinence   . Tobacco abuse     Past Surgical History:  Procedure Laterality Date  . APPENDECTOMY    . CATARACT EXTRACTION W/PHACO Left 01/26/2013   Procedure: LEFT EYE CATARACT EXTRACTION PHACO AND INTRAOCULAR LENS PLACEMENT  CDE=6.68;  Surgeon: Tonny Branch, MD;   Location: [facility];  Service: Ophthalmology;  Laterality: Left;  . CATARACT EXTRACTION W/PHACO Right 01/08/2013   Procedure: CATARACT EXTRACTION PHACO AND INTRAOCULAR LENS PLACEMENT ([place]);  Surgeon: Tonny Branch, MD;  Location: [facility];  Service: Ophthalmology;  Laterality: Right;  CDE 9.04  . CORONARY STENT PLACEMENT     heart stent  . EYE SURGERY Bilateral 01/2013   cataracts  . TUBAL LIGATION      Family History  Problem Relation Age of Onset  . Cancer Father        lung  . Cancer Mother        skin  . Stroke Mother   . Hypertension Mother   . Birth defects Sister        born with hole in her heart    Social History  Home school grandson tob use  1/2 pk day down from 1pk/day for 10 years Socioeconomic History  . Marital status: Single    Spouse name: Not on file  . Number of children: Not on file  . Years of education: Not on file  . Highest education level: Not on file  Occupational History  . Occupation: Disabled  Social Needs  . Financial resource strain: Not on file  . Food insecurity    Worry: Not on file    Inability: Not on file  . Transportation needs    Medical: Not on file    Non-medical: Not on file  Tobacco Use  . Smoking status: Current Every  Day Smoker    Packs/day: 0.50    Years: 30.00    Pack years: 15.00    Types: Cigarettes  . Smokeless tobacco: Never Used  Substance and Sexual Activity  . Alcohol use: No  . Drug use: No  . Sexual activity: Yes    Birth control/protection: Post-menopausal  Lifestyle  . Physical activity    Days per week: Not on file    Minutes per session: Not on file  . Stress: Not on file  Relationships  . Social Herbalist on phone: Not on file    Gets together: Not on file    Attends religious service: Not on file    Active member of club or organization: Not on file    Attends meetings of clubs or organizations: Not on file    Relationship status: Not on file  . Intimate partner violence    Fear of  current or ex partner: Not on file    Emotionally abused: Not on file    Physically abused: Not on file    Forced sexual activity: Not on file  Other Topics Concern  . Not on file  Social History Narrative  . Not on file    ROS Review of Systems  Constitutional: Positive for fatigue. Negative for fever.  HENT: Positive for ear pain, sinus pressure and sore throat.   Eyes: Positive for visual disturbance.  Respiratory: Positive for cough, shortness of breath and wheezing.   Cardiovascular: Negative for chest pain.  Gastrointestinal:       Reflux  Genitourinary: Negative for dysuria.       G3P3-vaginal delivery  Musculoskeletal: Positive for arthralgias and neck pain.  Allergic/Immunologic: Positive for environmental allergies.  Neurological: Negative for headaches.  Psychiatric/Behavioral: The patient is nervous/anxious.     Objective:   Today's Vitals: BP 126/81 (BP Location: Left Arm, Patient Position: Sitting, Cuff Size: Normal)   Pulse 78   Temp 98.8 F (37.1 C) (Oral)   Ht 5\' 3"  (1.6 m)   Wt 162 lb 6.4 oz (73.7 kg)   LMP 08/31/2010   SpO2 93%   BMI 28.77 kg/m   Physical Exam Constitutional:      Appearance: Normal appearance.  HENT:     Head: Normocephalic and atraumatic.     Ears:     Comments: Canal bilat -eryth, no swelling TM +LR right , fluid left TM    Nose: Congestion present.     Mouth/Throat:     Pharynx: Oropharyngeal exudate present.  Eyes:     Conjunctiva/sclera: Conjunctivae normal.  Neck:     Comments: Right -inferior to trapezius-soft tissue tenderness , no eryth  Cardiovascular:     Rate and Rhythm: Normal rate and regular rhythm.     Pulses: Normal pulses.     Heart sounds: Normal heart sounds.  Pulmonary:     Breath sounds: Wheezing and rhonchi present.  Musculoskeletal:        General: Tenderness present.     Comments: Tenderness anterior right shoulder with palpation LROM-right shoulder  Neurological:     Mental Status: She is  alert and oriented to person, place, and time.     Assessment & Plan:   1. Chronic obstructive pulmonary disease, unspecified COPD type ([place]) Albuterol-rx symbicort-rx  2. TOBACCO ABUSE Stop smoking  3. Gastroesophageal reflux disease without esophagitis Start omeprazole  4. Otitis of left ear zithromax-rx  5. Chronic right shoulder pain Ortho referral  6. Neck swelling  -  US Soft Tissue Head/Neck; Future - DG Cervical Spine Complete; Future  7. Neuropathy of finger of left hand Cervical spine  Outpatient Encounter Medications as of 09/24/2018  Medication Sig  . aspirin EC 81 MG tablet Take 81 mg by mouth daily.    . cetirizine (ZYRTEC) 10 MG tablet Take 10 mg by mouth daily.  . hydrOXYzine (ATARAX/VISTARIL) 25 MG tablet Take 25 mg by mouth 2 (two) times daily.  Marland Kitchen OLANZapine (ZYPREXA) 2.5 MG tablet Take 2.5 mg by mouth at bedtime.  Marland Kitchen ibuprofen (ADVIL,MOTRIN) 800 MG tablet Take 800 mg by mouth every 8 (eight) hours as needed for mild pain.  . nitroGLYCERIN (NITROSTAT) 0.4 MG SL tablet Place 1 tablet (0.4 mg total) under the tongue every 5 (five) minutes as needed for chest pain.  . predniSONE (DELTASONE) 50 MG tablet One tablet PO daily for 4 days (Patient not taking: Reported on 09/24/2018)  . sertraline (ZOLOFT) 50 MG tablet Take 1 tablet (50 mg total) by mouth daily. (Patient taking differently: Take 100 mg by mouth daily. )  . [DISCONTINUED] albuterol (PROVENTIL HFA;VENTOLIN HFA) 108 (90 Base) MCG/ACT inhaler Inhale 1-2 puffs into the lungs every 6 (six) hours as needed for wheezing or shortness of breath.  . [DISCONTINUED] benzonatate (TESSALON) 100 MG capsule Take 1 capsule (100 mg total) by mouth every 8 (eight) hours. (Patient not taking: Reported on 09/24/2018)  . [DISCONTINUED] cetirizine (ZYRTEC) 10 MG tablet Take 1 tablet (10 mg total) by mouth daily. (Patient not taking: Reported on 09/24/2018)  . [DISCONTINUED] Clobetasol Prop Emollient Base (CLOBETASOL PROPIONATE  E) 0.05 % emollient cream Apply 1 application topically 2 (two) times daily as needed (Rash). To back for rash  . [DISCONTINUED] fluticasone (FLONASE) 50 MCG/ACT nasal spray Place 1 spray into both nostrils daily as needed for allergies or rhinitis.  . [DISCONTINUED] fluticasone (FLONASE) 50 MCG/ACT nasal spray Place 2 sprays into both nostrils daily. (Patient not taking: Reported on 09/24/2018)  . [DISCONTINUED] hydrOXYzine (ATARAX/VISTARIL) 10 MG tablet Take 10 mg by mouth 3 (three) times daily as needed for anxiety.  . [DISCONTINUED] Omega-3 Fatty Acids (FISH OIL PO) Take 1 capsule by mouth daily.   . [DISCONTINUED] ranitidine (ZANTAC) 75 MG tablet Take 75 mg by mouth daily as needed for heartburn.   . [DISCONTINUED] triamcinolone cream (KENALOG) 0.1 % Apply 1 application topically 2 (two) times daily. (Patient not taking: Reported on 09/24/2018)   No facility-administered encounter medications on file as of 09/24/2018.     Terressa Evola Hannah Beat, MD

## 2018-09-30 ENCOUNTER — Ambulatory Visit (HOSPITAL_COMMUNITY): Admission: RE | Admit: 2018-09-30 | Payer: Medicaid Other | Source: Ambulatory Visit

## 2018-10-08 ENCOUNTER — Telehealth: Payer: Self-pay

## 2018-10-08 LAB — TSH: TSH: 2.31 mIU/L (ref 0.40–4.50)

## 2018-10-08 LAB — CBC
HCT: 42.1 % (ref 35.0–45.0)
Hemoglobin: 14.2 g/dL (ref 11.7–15.5)
MCH: 31.1 pg (ref 27.0–33.0)
MCHC: 33.7 g/dL (ref 32.0–36.0)
MCV: 92.1 fL (ref 80.0–100.0)
MPV: 12.7 fL — ABNORMAL HIGH (ref 7.5–12.5)
Platelets: 259 10*3/uL (ref 140–400)
RBC: 4.57 10*6/uL (ref 3.80–5.10)
RDW: 12.1 % (ref 11.0–15.0)
WBC: 5.8 10*3/uL (ref 3.8–10.8)

## 2018-10-08 LAB — COMPREHENSIVE METABOLIC PANEL
AG Ratio: 2 (calc) (ref 1.0–2.5)
ALT: 9 U/L (ref 6–29)
AST: 13 U/L (ref 10–35)
Albumin: 4.4 g/dL (ref 3.6–5.1)
Alkaline phosphatase (APISO): 56 U/L (ref 37–153)
BUN/Creatinine Ratio: 6 (calc) (ref 6–22)
BUN: 5 mg/dL — ABNORMAL LOW (ref 7–25)
CO2: 29 mmol/L (ref 20–32)
Calcium: 9.9 mg/dL (ref 8.6–10.4)
Chloride: 110 mmol/L (ref 98–110)
Creat: 0.8 mg/dL (ref 0.50–0.99)
Globulin: 2.2 g/dL (calc) (ref 1.9–3.7)
Glucose, Bld: 99 mg/dL (ref 65–99)
Potassium: 5.4 mmol/L — ABNORMAL HIGH (ref 3.5–5.3)
Sodium: 144 mmol/L (ref 135–146)
Total Bilirubin: 0.3 mg/dL (ref 0.2–1.2)
Total Protein: 6.6 g/dL (ref 6.1–8.1)

## 2018-10-08 LAB — LIPID PANEL
Cholesterol: 188 mg/dL (ref <200)
HDL: 36 mg/dL — ABNORMAL LOW (ref >=50)
LDL Cholesterol (Calc): 124 mg/dL (calc) — ABNORMAL HIGH
Non-HDL Cholesterol (Calc): 152 mg/dL (calc) — ABNORMAL HIGH (ref <130)
Total CHOL/HDL Ratio: 5.2 (calc) — ABNORMAL HIGH (ref <5.0)
Triglycerides: 168 mg/dL — ABNORMAL HIGH (ref <150)

## 2018-10-08 LAB — VITAMIN B12: Vitamin B-12: 198 pg/mL — ABNORMAL LOW (ref 200–1100)

## 2018-10-08 NOTE — Telephone Encounter (Signed)
Urgent referral has been made to Dr. Benjamine Mola here in the [facility]

## 2018-10-08 NOTE — Telephone Encounter (Signed)
Melissa Garcia is stating that she is getting real bad headaches still she had one yesterday and it was so bad that it makes her vomit she started shaking she states today her head still hurts mildly she is asking for advice?

## 2018-10-08 NOTE — Telephone Encounter (Signed)
See if you can have ENT see pt as an urgent due to acute on chronic ear pain-[place] or  .

## 2018-10-16 ENCOUNTER — Ambulatory Visit: Payer: Medicaid Other | Admitting: Orthopaedic Surgery

## 2018-10-16 ENCOUNTER — Ambulatory Visit: Payer: Medicaid Other

## 2018-10-16 ENCOUNTER — Other Ambulatory Visit: Payer: Self-pay

## 2018-10-16 ENCOUNTER — Encounter: Payer: Self-pay | Admitting: Orthopaedic Surgery

## 2018-10-16 VITALS — BP 131/81 | HR 77 | Temp 97.4°F | Ht 63.0 in | Wt 164.0 lb

## 2018-10-16 DIAGNOSIS — M542 Cervicalgia: Secondary | ICD-10-CM

## 2018-10-16 DIAGNOSIS — G8929 Other chronic pain: Secondary | ICD-10-CM | POA: Diagnosis not present

## 2018-10-16 DIAGNOSIS — M25511 Pain in right shoulder: Secondary | ICD-10-CM

## 2018-10-16 MED ORDER — PREDNISONE 5 MG (21) PO TBPK: ORAL_TABLET | ORAL | 0 refills | Status: DC

## 2018-10-16 NOTE — Progress Notes (Signed)
Subjective:    Patient ID: Melissa Garcia, female    DOB: 1960-04-05, 58 y.o.   MRN: [id number]  HPI She has right shoulder and neck pain for several months getting worse. She has pain running to the right wrist and the thumb area at times. She has no weakness.  She has no trauma, no redness. She has tried ice, heat, rest, Advil, Tylenol with no help.  She has pain laying on the right shoulder at night.   Review of Systems  Constitutional: Positive for activity change.  Musculoskeletal: Positive for arthralgias and neck pain.  All other systems reviewed and are negative.  For Review of Systems, all other systems reviewed and are negative.  The following is a summary of the past history medically, past history surgically, known current medicines, social history and family history.  This information is gathered electronically by the computer from prior information and documentation.  I review this each visit and have found including this information at this point in the chart is beneficial and informative.   Past Medical History:  Diagnosis Date  . Bipolar disorder ([place])   . COPD (chronic obstructive pulmonary disease) ([place])   . Coronary atherosclerosis of native coronary artery    a. 09/2008 Ant STEMI/VF Arrest/PCI: LM nl, LAD 44m (2.75x23 Xience DES), D1 small, D2 90ost (PTCA), LCX small, min irregs, RI nl, RCA 20-30 diff, EF 30%;  b. 11/2013 Lexi MV: EF 57%, no ischemia.  . Eczema   . Essential hypertension, benign   . Fibroids   . History of Ischemic Cardiomyopathy - resolved    a. 09/2008 EF 30% @ time of MI;  b. 05/2012 Echo: EF 55-60%, no rwma.  . History of UTI 10/29/2013  . Strep throat   . Stress incontinence   . Tobacco abuse     Past Surgical History:  Procedure Laterality Date  . APPENDECTOMY    . CATARACT EXTRACTION W/PHACO Left 01/26/2013   Procedure: LEFT EYE CATARACT EXTRACTION PHACO AND INTRAOCULAR LENS PLACEMENT  CDE=6.68;  Surgeon: Tonny Branch, MD;  Location: [facility];  Service: Ophthalmology;  Laterality: Left;  . CATARACT EXTRACTION W/PHACO Right 01/08/2013   Procedure: CATARACT EXTRACTION PHACO AND INTRAOCULAR LENS PLACEMENT ([place]);  Surgeon: Tonny Branch, MD;  Location: [facility];  Service: Ophthalmology;  Laterality: Right;  CDE 9.04  . CORONARY STENT PLACEMENT     heart stent  . EYE SURGERY Bilateral 01/2013   cataracts  . TUBAL LIGATION      Current Outpatient Medications on File Prior to Visit  Medication Sig Dispense Refill  . albuterol (VENTOLIN HFA) 108 (90 Base) MCG/ACT inhaler Inhale 2 puffs into the lungs every 6 (six) hours as needed for wheezing or shortness of breath. 18 g 1  . aspirin EC 81 MG tablet Take 81 mg by mouth daily.      . budesonide-formoterol (SYMBICORT) 80-4.5 MCG/ACT inhaler Inhale 2 puffs into the lungs 2 (two) times daily. 1 Inhaler 3  . cetirizine (ZYRTEC) 10 MG tablet Take 10 mg by mouth daily.    . hydrOXYzine (ATARAX/VISTARIL) 25 MG tablet Take 25 mg by mouth 2 (two) times daily.    Marland Kitchen ibuprofen (ADVIL,MOTRIN) 800 MG tablet Take 800 mg by mouth every 8 (eight) hours as needed for mild pain.    Marland Kitchen omeprazole (PRILOSEC) 40 MG capsule Take 1 capsule (40 mg total) by mouth daily. 30 capsule 3  . sertraline (ZOLOFT) 50 MG tablet Take 1 tablet (50 mg total) by  mouth daily. (Patient taking differently: Take 100 mg by mouth daily. ) 30 tablet 1  . azithromycin (ZITHROMAX) 250 MG tablet 2 po today then 1 po day 2-5 (Patient not taking: Reported on 10/16/2018) 6 tablet 0  . nitroGLYCERIN (NITROSTAT) 0.4 MG SL tablet Place 1 tablet (0.4 mg total) under the tongue every 5 (five) minutes as needed for chest pain. (Patient not taking: Reported on 10/16/2018) 25 tablet 3  . OLANZapine (ZYPREXA) 2.5 MG tablet Take 2.5 mg by mouth at bedtime.    . predniSONE (DELTASONE) 50 MG tablet One tablet PO daily for 4 days (Patient not taking: Reported on 10/16/2018) 4 tablet 0   No current facility-administered medications on file prior to visit.      Social History   Socioeconomic History  . Marital status: Single    Spouse name: Not on file  . Number of children: Not on file  . Years of education: Not on file  . Highest education level: Not on file  Occupational History  . Occupation: Disabled  Social Needs  . Financial resource strain: Not on file  . Food insecurity    Worry: Not on file    Inability: Not on file  . Transportation needs    Medical: Not on file    Non-medical: Not on file  Tobacco Use  . Smoking status: Current Every Day Smoker    Packs/day: 0.50    Years: 30.00    Pack years: 15.00    Types: Cigarettes  . Smokeless tobacco: Never Used  Substance and Sexual Activity  . Alcohol use: No  . Drug use: No  . Sexual activity: Yes    Birth control/protection: Post-menopausal  Lifestyle  . Physical activity    Days per week: 3 days    Minutes per session: 10 min  . Stress: Not on file  Relationships  . Social Herbalist on phone: Not on file    Gets together: Not on file    Attends religious service: Not on file    Active member of club or organization: Not on file    Attends meetings of clubs or organizations: Not on file    Relationship status: Not on file  . Intimate partner violence    Fear of current or ex partner: Not on file    Emotionally abused: Not on file    Physically abused: Not on file    Forced sexual activity: Not on file  Other Topics Concern  . Not on file  Social History Narrative  . Not on file    Family History  Problem Relation Age of Onset  . Cancer Father        lung  . Cancer Mother        skin  . Stroke Mother   . Hypertension Mother   . Birth defects Sister        born with hole in her heart    BP 131/81   Pulse 77   Temp (!) 97.4 F (36.3 C)   Ht 5\' 3"  (1.6 m)   Wt 164 lb (74.4 kg)   LMP 08/31/2010   BMI 29.05 kg/m   Body mass index is 29.05 kg/m.      Objective:   Physical Exam Vitals signs reviewed.  Constitutional:       Appearance: She is well-developed.  HENT:     Head: Normocephalic and atraumatic.  Eyes:     Conjunctiva/sclera: Conjunctivae normal.  Pupils: Pupils are equal, round, and reactive to light.  Neck:     Musculoskeletal: Normal range of motion and neck supple.  Cardiovascular:     Rate and Rhythm: Normal rate and regular rhythm.  Pulmonary:     Effort: Pulmonary effort is normal.  Abdominal:     Palpations: Abdomen is soft.  Musculoskeletal:       Arms:  Skin:    General: Skin is warm and dry.  Neurological:     Mental Status: She is alert and oriented to person, place, and time.     Cranial Nerves: No cranial nerve deficit.     Motor: No abnormal muscle tone.     Coordination: Coordination normal.     Deep Tendon Reflexes: Reflexes are normal and symmetric. Reflexes normal.  Psychiatric:        Behavior: Behavior normal.        Thought Content: Thought content normal.        Judgment: Judgment normal.   x-rays were done of the right shoulder and cervical spine, reported separately.        Assessment & Plan:   Encounter Diagnoses  Name Primary?  . Chronic right shoulder pain Yes  . Neck pain on right side    I will give prednisone dose pack.  She will need MRI of cervical spine but has to wait six weeks.  Return in three weeks.  Take Aleve one bid pc.  Electronically Signed Sanjuana Kava, MD 9/10/202011:27 AM

## 2018-11-11 ENCOUNTER — Other Ambulatory Visit: Payer: Self-pay

## 2018-11-11 DIAGNOSIS — Z20822 Contact with and (suspected) exposure to covid-19: Secondary | ICD-10-CM

## 2018-11-13 ENCOUNTER — Ambulatory Visit: Payer: Medicaid Other | Admitting: Orthopaedic Surgery

## 2018-11-13 LAB — NOVEL CORONAVIRUS, NAA: SARS-CoV-2, NAA: NOT DETECTED

## 2018-11-24 ENCOUNTER — Ambulatory Visit (INDEPENDENT_AMBULATORY_CARE_PROVIDER_SITE_OTHER): Payer: Medicaid Other | Admitting: Otolaryngology

## 2018-11-24 ENCOUNTER — Other Ambulatory Visit: Payer: Self-pay

## 2018-11-24 DIAGNOSIS — H9209 Otalgia, unspecified ear: Secondary | ICD-10-CM

## 2018-11-24 DIAGNOSIS — H903 Sensorineural hearing loss, bilateral: Secondary | ICD-10-CM

## 2018-11-25 ENCOUNTER — Ambulatory Visit: Payer: Medicaid Other | Admitting: Orthopaedic Surgery

## 2018-11-25 ENCOUNTER — Encounter: Payer: Self-pay | Admitting: Family Medicine

## 2018-11-25 ENCOUNTER — Ambulatory Visit: Payer: Medicaid Other | Admitting: Family Medicine

## 2018-11-26 ENCOUNTER — Other Ambulatory Visit: Payer: Self-pay

## 2018-11-26 ENCOUNTER — Ambulatory Visit (HOSPITAL_COMMUNITY)
Admission: RE | Admit: 2018-11-26 | Discharge: 2018-11-26 | Disposition: A | Discharge status: Home / Self Care | Payer: Medicaid Other | Source: Ambulatory Visit | Attending: Family Medicine | Admitting: Family Medicine

## 2018-11-26 DIAGNOSIS — Z1231 Encounter for screening mammogram for malignant neoplasm of breast: Secondary | ICD-10-CM | POA: Insufficient documentation

## 2018-11-27 ENCOUNTER — Other Ambulatory Visit (HOSPITAL_COMMUNITY): Payer: Self-pay | Admitting: Family Medicine

## 2018-11-27 DIAGNOSIS — R928 Other abnormal and inconclusive findings on diagnostic imaging of breast: Secondary | ICD-10-CM

## 2018-12-02 ENCOUNTER — Encounter: Payer: Self-pay | Admitting: Orthopaedic Surgery

## 2018-12-02 ENCOUNTER — Other Ambulatory Visit: Payer: Self-pay

## 2018-12-02 ENCOUNTER — Ambulatory Visit: Payer: Medicaid Other | Admitting: Orthopaedic Surgery

## 2018-12-02 VITALS — BP 122/67 | HR 74 | Ht 63.0 in | Wt 167.0 lb

## 2018-12-02 DIAGNOSIS — G8929 Other chronic pain: Secondary | ICD-10-CM

## 2018-12-02 DIAGNOSIS — M25511 Pain in right shoulder: Secondary | ICD-10-CM

## 2018-12-02 NOTE — Progress Notes (Signed)
CC:  My shoulder is much better  She has little pain of the right shoulder.  She has near full ROM.  NV intact.  Encounter Diagnosis  Name Primary?  . Chronic right shoulder pain Yes   I will see as needed.  Call if any problem.  Precautions discussed.   Electronically Signed Sanjuana Kava, MD 10/27/202011:24 AM

## 2018-12-08 ENCOUNTER — Telehealth: Payer: Self-pay | Admitting: Family Medicine

## 2018-12-08 NOTE — Telephone Encounter (Signed)
Returned call to preservice center in regards to voicemail that was left about the patient ultrasound for 12/09/18 is needing prior authorization through insurance. Left voicemail for return call.

## 2018-12-08 NOTE — Telephone Encounter (Signed)
Velna Hatchet called back  And states the patient has Medicaid of [place] and will need a out of network prior authorization for her appointment tomorrow. Velna Hatchet can be reached at [phone] ext [phone]

## 2018-12-08 NOTE — Telephone Encounter (Signed)
Spoke to [employer]

## 2018-12-09 ENCOUNTER — Ambulatory Visit (HOSPITAL_COMMUNITY): Payer: Medicaid - Out of State

## 2018-12-09 ENCOUNTER — Ambulatory Visit (HOSPITAL_COMMUNITY): Payer: Medicaid Other

## 2018-12-09 ENCOUNTER — Encounter (HOSPITAL_COMMUNITY): Payer: Self-pay

## 2018-12-15 ENCOUNTER — Other Ambulatory Visit: Payer: Self-pay | Admitting: Family Medicine

## 2018-12-18 NOTE — Telephone Encounter (Signed)
preservice is calling back and states patient insurance has no records of filing for a authorization. She would like a call back at [phone] ext [phone]

## 2018-12-23 ENCOUNTER — Ambulatory Visit (HOSPITAL_COMMUNITY): Payer: Medicaid Other

## 2018-12-23 ENCOUNTER — Ambulatory Visit (HOSPITAL_COMMUNITY): Admission: RE | Admit: 2018-12-23 | Payer: Medicaid Other | Source: Ambulatory Visit

## 2018-12-29 ENCOUNTER — Other Ambulatory Visit: Payer: Self-pay

## 2018-12-29 ENCOUNTER — Ambulatory Visit (HOSPITAL_COMMUNITY)
Admission: RE | Admit: 2018-12-29 | Discharge: 2018-12-29 | Disposition: A | Discharge status: Home / Self Care | Payer: Medicaid Other | Source: Ambulatory Visit | Attending: Family Medicine | Admitting: Family Medicine

## 2018-12-29 DIAGNOSIS — R221 Localized swelling, mass and lump, neck: Secondary | ICD-10-CM | POA: Insufficient documentation

## 2019-01-05 ENCOUNTER — Telehealth: Payer: Self-pay | Admitting: Family Medicine

## 2019-01-05 NOTE — Telephone Encounter (Signed)
Done

## 2019-01-05 NOTE — Telephone Encounter (Signed)
Brandi called from the preservice center and states the pateint is scheduled tomorrow 12/1 for a breast ultrasound and medicaid has denied it. She is needing to know if a peer to peer is going to be completed. She can be called back at [phone] ext [phone]

## 2019-01-06 ENCOUNTER — Ambulatory Visit (HOSPITAL_COMMUNITY): Admission: RE | Admit: 2019-01-06 | Payer: Medicaid Other | Source: Ambulatory Visit

## 2019-01-06 ENCOUNTER — Ambulatory Visit (HOSPITAL_COMMUNITY): Payer: Medicaid Other

## 2019-01-07 ENCOUNTER — Telehealth (INDEPENDENT_AMBULATORY_CARE_PROVIDER_SITE_OTHER): Payer: Medicaid Other | Admitting: Family Medicine

## 2019-01-07 ENCOUNTER — Other Ambulatory Visit: Payer: Self-pay

## 2019-01-07 ENCOUNTER — Other Ambulatory Visit: Payer: Self-pay | Admitting: Family Medicine

## 2019-01-07 DIAGNOSIS — R591 Generalized enlarged lymph nodes: Secondary | ICD-10-CM | POA: Diagnosis not present

## 2019-01-07 NOTE — Progress Notes (Signed)
Virtual Visit via Telephone Note  I connected with Melissa Garcia on 01/07/19 at  3:40 PM EST by telephone and verified that I am speaking with the correct person using two identifiers.  Location: Patient: home Provider:clinic   I discussed the limitations, risks, security and privacy concerns of performing an evaluation and management service by telephone and the availability of in person appointments. I also discussed with the patient that there may be a patient responsible charge related to this service. The patient expressed understanding and agreed to proceed.   History of Present Illness: Pt with lymph node enlargement noted on ultrasound. Pt states no neck pain or masses. Pt states no fever. Pt missed breast ultrasound appt due to family member with hosptialization.  No lymph nodes felt I the underarms or groin   Observations/Objective: No recent VS  Assessment and Plan: 1. Lymphadenopathy labwork to assess -ENT referral for decision of need for CT Follow Up Instructions: reschedule u/s breast as per recommendations ENT referral    I discussed the assessment and treatment plan with the patient. The patient was provided an opportunity to ask questions and all were answered. The patient agreed with the plan and demonstrated an understanding of the instructions.   The patient was advised to call back or seek an in-person evaluation if the symptoms worsen or if the condition fails to improve as anticipated.  I provided  8 minutes of non-face-to-face time during this encounter.   Yekaterina Escutia Hannah Beat, MD

## 2019-01-10 LAB — CBC WITH DIFFERENTIAL/PLATELET
Absolute Monocytes: 529 cells/uL (ref 200–950)
Basophils Absolute: 38 cells/uL (ref 0–200)
Basophils Relative: 0.6 %
Eosinophils Absolute: 189 cells/uL (ref 15–500)
Eosinophils Relative: 3 %
HCT: 38.8 % (ref 35.0–45.0)
Hemoglobin: 13.2 g/dL (ref 11.7–15.5)
Lymphs Abs: 1934 cells/uL (ref 850–3900)
MCH: 31.4 pg (ref 27.0–33.0)
MCHC: 34 g/dL (ref 32.0–36.0)
MCV: 92.2 fL (ref 80.0–100.0)
MPV: 13.4 fL — ABNORMAL HIGH (ref 7.5–12.5)
Monocytes Relative: 8.4 %
Neutro Abs: 3610 cells/uL (ref 1500–7800)
Neutrophils Relative %: 57.3 %
Platelets: 232 10*3/uL (ref 140–400)
RBC: 4.21 10*6/uL (ref 3.80–5.10)
RDW: 12 % (ref 11.0–15.0)
Total Lymphocyte: 30.7 %
WBC: 6.3 10*3/uL (ref 3.8–10.8)

## 2019-01-10 LAB — COMPLETE METABOLIC PANEL WITH GFR
AG Ratio: 2.2 (calc) (ref 1.0–2.5)
ALT: 12 U/L (ref 6–29)
AST: 14 U/L (ref 10–35)
Albumin: 4.1 g/dL (ref 3.6–5.1)
Alkaline phosphatase (APISO): 58 U/L (ref 37–153)
BUN: 8 mg/dL (ref 7–25)
CO2: 26 mmol/L (ref 20–32)
Calcium: 9.1 mg/dL (ref 8.6–10.4)
Chloride: 108 mmol/L (ref 98–110)
Creat: 0.86 mg/dL (ref 0.50–0.99)
GFR, Est African American: 85 mL/min/{1.73_m2} (ref >=60)
GFR, Est Non African American: 73 mL/min/{1.73_m2} (ref >=60)
Globulin: 1.9 g/dL (calc) (ref 1.9–3.7)
Glucose, Bld: 91 mg/dL (ref 65–139)
Potassium: 4.4 mmol/L (ref 3.5–5.3)
Sodium: 142 mmol/L (ref 135–146)
Total Bilirubin: 0.3 mg/dL (ref 0.2–1.2)
Total Protein: 6 g/dL — ABNORMAL LOW (ref 6.1–8.1)

## 2019-01-12 ENCOUNTER — Telehealth (INDEPENDENT_AMBULATORY_CARE_PROVIDER_SITE_OTHER): Payer: Medicaid Other | Admitting: Family Medicine

## 2019-01-12 ENCOUNTER — Ambulatory Visit
Admission: EM | Admit: 2019-01-12 | Discharge: 2019-01-12 | Disposition: A | Discharge status: Home / Self Care | Payer: Medicaid Other | Attending: Emergency Medicine | Admitting: Emergency Medicine

## 2019-01-12 ENCOUNTER — Other Ambulatory Visit: Payer: Self-pay

## 2019-01-12 DIAGNOSIS — R053 Chronic cough: Secondary | ICD-10-CM | POA: Insufficient documentation

## 2019-01-12 DIAGNOSIS — R062 Wheezing: Secondary | ICD-10-CM

## 2019-01-12 DIAGNOSIS — J0100 Acute maxillary sinusitis, unspecified: Secondary | ICD-10-CM

## 2019-01-12 DIAGNOSIS — Z20828 Contact with and (suspected) exposure to other viral communicable diseases: Secondary | ICD-10-CM | POA: Diagnosis not present

## 2019-01-12 DIAGNOSIS — R05 Cough: Secondary | ICD-10-CM

## 2019-01-12 DIAGNOSIS — Z20822 Contact with and (suspected) exposure to covid-19: Secondary | ICD-10-CM

## 2019-01-12 LAB — POC SARS CORONAVIRUS 2 AG -  ED: SARS Coronavirus 2 Ag: NEGATIVE

## 2019-01-12 MED ORDER — PREDNISONE 10 MG (21) PO TBPK: ORAL_TABLET | Freq: Every day | ORAL | 0 refills | Status: DC

## 2019-01-12 MED ORDER — DOXYCYCLINE HYCLATE 100 MG PO CAPS: 100.0000 mg | ORAL_CAPSULE | Freq: Two times a day (BID) | ORAL | 0 refills | Status: DC

## 2019-01-12 MED ORDER — ALBUTEROL SULFATE HFA 108 (90 BASE) MCG/ACT IN AERS: 1.0000 | INHALATION_SPRAY | Freq: Four times a day (QID) | RESPIRATORY_TRACT | 0 refills | Status: AC | PRN

## 2019-01-12 NOTE — ED Provider Notes (Signed)
[place]   [id number] 01/12/19 Arrival Time: 6761   CC: COVID symptoms  SUBJECTIVE: History from: patient.  Melissa Garcia is a 58 y.o. female who presents with dry cough, SOB, sinus headache, and body aches x 3-4 days.  Denies sick exposure to COVID, flu or strep.  Denies recent travel.  Has tried OTC flonase with relief.  Denies aggravating factors.  Reports previous symptoms in the past.   Complains of associated chills, nausea, rhinorrhea, sneezing, wheezing, and chest congestion.  Denies fever, sore throat, chest pain, vomiting, changes in bowel or bladder habits.     ROS: As per HPI.  All other pertinent ROS negative.     Past Medical History:  Diagnosis Date  . Bipolar disorder ([place])   . COPD (chronic obstructive pulmonary disease) ([place])   . Coronary atherosclerosis of native coronary artery    a. 09/2008 Ant STEMI/VF Arrest/PCI: LM nl, LAD 33m(2.75x23 Xience DES), D1 small, D2 90ost (PTCA), LCX small, min irregs, RI nl, RCA 20-30 diff, EF 30%;  b. 11/2013 Lexi MV: EF 57%, no ischemia.  . Eczema   . Essential hypertension, benign   . Fibroids   . History of Ischemic Cardiomyopathy - resolved    a. 09/2008 EF 30% @ time of MI;  b. 05/2012 Echo: EF 55-60%, no rwma.  . History of UTI 10/29/2013  . Strep throat   . Stress incontinence   . Tobacco abuse    Past Surgical History:  Procedure Laterality Date  . APPENDECTOMY    . CATARACT EXTRACTION W/PHACO Left 01/26/2013   Procedure: LEFT EYE CATARACT EXTRACTION PHACO AND INTRAOCULAR LENS PLACEMENT  CDE=6.68;  Surgeon: KTonny Branch MD;  Location: [facility];  Service: Ophthalmology;  Laterality: Left;  . CATARACT EXTRACTION W/PHACO Right 01/08/2013   Procedure: CATARACT EXTRACTION PHACO AND INTRAOCULAR LENS PLACEMENT ([place];  Surgeon: KTonny Branch MD;  Location: [facility];  Service: Ophthalmology;  Laterality: Right;  CDE 9.04  . CORONARY STENT PLACEMENT     heart stent  . EYE SURGERY Bilateral 01/2013   cataracts  . TUBAL  LIGATION     Allergies  Allergen Reactions  . Codeine Nausea And Vomiting    Vicodin/Percocet Specified= excessive vomiting  . Depakote [Divalproex Sodium] Other (See Comments)    "Mood Swings--Felt like out of my mind"  . Sulfonamide Derivatives Hives  . Toviaz [Fesoterodine Fumarate Er]     Extremely dry mouth, felt hot like hotflashes. Felt dehydrated.   No current facility-administered medications on file prior to encounter.    Current Outpatient Medications on File Prior to Encounter  Medication Sig Dispense Refill  . aspirin EC 81 MG tablet Take 81 mg by mouth daily.      . budesonide-formoterol (SYMBICORT) 80-4.5 MCG/ACT inhaler Inhale 2 puffs into the lungs 2 (two) times daily. 1 Inhaler 3  . cetirizine (ZYRTEC) 10 MG tablet Take 10 mg by mouth daily.    . hydrOXYzine (ATARAX/VISTARIL) 25 MG tablet Take 25 mg by mouth 2 (two) times daily.    .Marland Kitchenibuprofen (ADVIL,MOTRIN) 800 MG tablet Take 800 mg by mouth every 8 (eight) hours as needed for mild pain.    .Marland KitchenOLANZapine (ZYPREXA) 2.5 MG tablet Take 2.5 mg by mouth at bedtime.    .Marland Kitchenomeprazole (PRILOSEC) 40 MG capsule TAKE 1 CAPSULE BY MOUTH EVERY DAY 90 capsule 1  . sertraline (ZOLOFT) 50 MG tablet Take 1 tablet (50 mg total) by mouth daily. (Patient taking differently: Take 100 mg by  mouth daily. ) 30 tablet 1  . [DISCONTINUED] nitroGLYCERIN (NITROSTAT) 0.4 MG SL tablet Place 1 tablet (0.4 mg total) under the tongue every 5 (five) minutes as needed for chest pain. (Patient not taking: Reported on 10/16/2018) 25 tablet 3   Social History   Socioeconomic History  . Marital status: Single    Spouse name: Not on file  . Number of children: Not on file  . Years of education: Not on file  . Highest education level: Not on file  Occupational History  . Occupation: Disabled  Social Needs  . Financial resource strain: Not on file  . Food insecurity    Worry: Not on file    Inability: Not on file  . Transportation needs    Medical:  Not on file    Non-medical: Not on file  Tobacco Use  . Smoking status: Current Every Day Smoker    Packs/day: 0.50    Years: 30.00    Pack years: 15.00    Types: Cigarettes  . Smokeless tobacco: Never Used  Substance and Sexual Activity  . Alcohol use: No  . Drug use: No  . Sexual activity: Yes    Birth control/protection: Post-menopausal  Lifestyle  . Physical activity    Days per week: 3 days    Minutes per session: 10 min  . Stress: Not on file  Relationships  . Social Herbalist on phone: Not on file    Gets together: Not on file    Attends religious service: Not on file    Active member of club or organization: Not on file    Attends meetings of clubs or organizations: Not on file    Relationship status: Not on file  . Intimate partner violence    Fear of current or ex partner: Not on file    Emotionally abused: Not on file    Physically abused: Not on file    Forced sexual activity: Not on file  Other Topics Concern  . Not on file  Social History Narrative  . Not on file   Family History  Problem Relation Age of Onset  . Cancer Father        lung  . Cancer Mother        skin  . Stroke Mother   . Hypertension Mother   . Birth defects Sister        born with hole in her heart    OBJECTIVE:  Vitals:   01/12/19 1442  BP: 137/82  Pulse: 82  Resp: 17  Temp: 98.4 F (36.9 C)  TempSrc: Oral  SpO2: 94%     General appearance: alert; appears fatigued, but nontoxic; speaking in full sentences and tolerating own secretions HEENT: NCAT; Ears: EACs clear, TMs pearly gray; Eyes: PERRL.  EOM grossly intact. Sinuses: TTP over maxillary sinuses; Nose: nares patent without rhinorrhea, Throat: oropharynx clear, tonsils non erythematous or enlarged, uvula midline  Neck: supple without LAD Lungs: unlabored respirations, symmetrical air entry; cough: mild; no respiratory distress; expiratory wheezes heard throughout bilateral lung fields Heart: regular rate  and rhythm.   Skin: warm and dry Psychological: alert and cooperative; normal mood and affect  LABS:  Results for orders placed or performed during the hospital encounter of 01/12/19 (from the past 24 hour(s))  POC SARS Coronavirus 2 Ag-ED - Nasal Swab (BD Veritor Kit)     Status: None   Collection Time: 01/12/19  3:01 PM  Result Value Ref Range  SARS Coronavirus 2 Ag Negative Negative    ASSESSMENT & PLAN:  1. Suspected COVID-19 virus infection   2. Wheezing   3. Acute non-recurrent maxillary sinusitis     Meds ordered this encounter  Medications  . predniSONE (STERAPRED UNI-PAK 21 TAB) 10 MG (21) TBPK tablet    Sig: Take by mouth daily. Take 6 tabs by mouth daily  for 2 days, then 5 tabs for 2 days, then 4 tabs for 2 days, then 3 tabs for 2 days, 2 tabs for 2 days, then 1 tab by mouth daily for 2 days    Dispense:  42 tablet    Refill:  0    Order Specific Question:   Supervising Provider    Answer:   Raylene Everts [[id number]]  . doxycycline (VIBRAMYCIN) 100 MG capsule    Sig: Take 1 capsule (100 mg total) by mouth 2 (two) times daily for 10 days.    Dispense:  20 capsule    Refill:  0    Order Specific Question:   Supervising Provider    Answer:   Raylene Everts [[id number]]  . albuterol (VENTOLIN HFA) 108 (90 Base) MCG/ACT inhaler    Sig: Inhale 1-2 puffs into the lungs every 6 (six) hours as needed for wheezing or shortness of breath.    Dispense:  18 g    Refill:  0    Order Specific Question:   Supervising Provider    Answer:   Raylene Everts [[id number]]   Rapid COVID negative.  Culture sent.  Patient should remain in quarantine until they have received culture results.  If negative you may resume normal activities (go back to work/school) while practicing hand hygiene, social distance, and mask wearing.  If positive, patient should remain in quarantine for 10 days from symptom onset AND greater than 72 hours after symptoms resolution (absence of fever without  the use of fever-reducing medication and improvement in respiratory symptoms), whichever is longer Get plenty of rest and push fluids Prednisone prescribed for wheezing Albuterol inhaler prescribed use as needed for shortness of breath and/or wheezing Doxycycline prescribed for sinus infection Use OTC zyrtec for nasal congestion, runny nose, and/or sore throat Use OTC flonase for nasal congestion and runny nose Use medications daily for symptom relief Use OTC medications like ibuprofen or tylenol as needed fever or pain Follow up with PCP in the next 1-2 days Call or go to the ED if you have any new or worsening symptoms such as fever, worsening cough, shortness of breath, chest tightness, chest pain, turning blue, changes in mental status, etc...    Reviewed expectations re: course of current medical issues. Questions answered. Outlined signs and symptoms indicating need for more acute intervention. Patient verbalized understanding. After Visit Summary given.         Lestine Box, PA-C 01/12/19 2035

## 2019-01-12 NOTE — Patient Instructions (Signed)
Urgent Care for evaluation Address given

## 2019-01-12 NOTE — ED Triage Notes (Signed)
Pt presents to UC w/ c/o cough, sob, body aches x3 days.

## 2019-01-12 NOTE — Progress Notes (Signed)
Virtual Visit via Telephone Note  I connected with [name] on 01/12/19 at  1:00 PM EST by telephone and verified that I am speaking with the correct person using two identifiers.DOB/address  Location: Patient: home Provider: clinic   I discussed the limitations, risks, security and privacy concerns of performing an evaluation and management service by telephone and the availability of in person appointments. I also discussed with the patient that there may be a patient responsible charge related to this service. The patient expressed understanding and agreed to proceed.   History of Present Illness: Pt with hacking cough and congestion-onset Friday Chest wall pain-coughing since Friday-pt feels contributed to chest wall pain Pt with sweating and chills-? fever Pt with exposure at her home to a person with admission to [facility] who has been COVID tested-test results pending. Pt is caring for this person in her home. Pt cares for her elderly mother Pt with SOB Achy-chest wall No sore throat No diarrhea No loss of taste or smell-no appetite Pt using nose spray -no over the counter medication Pt has lost albuterol inhaler-pt using symbicort   Observations/Objective: none  Assessment and Plan:  1. Cough, persistent Chills/fever, possible COVID exposure-caring for pt with testing pending that was admitted to [facility] for amputation. Pt states unsure of temp-no thermometer. Pt with no s/t, no diarrhea Using symbicort, out of albuterol Follow Up Instructions:UC for evaluation -concern for COPD with cough/chills/fever/SOB/chest tightness    I discussed the assessment and treatment plan with the patient. The patient was provided an opportunity to ask questions and all were answered. The patient agreed with the plan and demonstrated an understanding of the instructions. Address given for UC. Called UC to discuss pt referral for additional evaluation /triage   The patient was advised  to go to [facility] for evaluation  I provided 8 minutes of non-face-to-face time during this encounter.   LISA Hannah Beat, MD

## 2019-01-12 NOTE — Discharge Instructions (Addendum)
Rapid COVID negative.  Culture sent.  Patient should remain in quarantine until they have received culture results.  If negative you may resume normal activities (go back to work/school) while practicing hand hygiene, social distance, and mask wearing.  If positive, patient should remain in quarantine for 10 days from symptom onset AND greater than 72 hours after symptoms resolution (absence of fever without the use of fever-reducing medication and improvement in respiratory symptoms), whichever is longer Get plenty of rest and push fluids Prednisone prescribed for wheezing Albuterol inhaler prescribed use as needed for shortness of breath and/or wheezing Doxycycline prescribed for sinus infection Use OTC zyrtec for nasal congestion, runny nose, and/or sore throat Use OTC flonase for nasal congestion and runny nose Use medications daily for symptom relief Use OTC medications like ibuprofen or tylenol as needed fever or pain Follow up with PCP in the next 1-2 days Call or go to the ED if you have any new or worsening symptoms such as fever, worsening cough, shortness of breath, chest tightness, chest pain, turning blue, changes in mental status, etc..Marland Kitchen

## 2019-01-14 ENCOUNTER — Encounter (HOSPITAL_COMMUNITY): Payer: Self-pay

## 2019-01-14 ENCOUNTER — Telehealth: Payer: Self-pay | Admitting: Family Medicine

## 2019-01-14 ENCOUNTER — Other Ambulatory Visit: Payer: Self-pay

## 2019-01-14 DIAGNOSIS — R05 Cough: Secondary | ICD-10-CM | POA: Insufficient documentation

## 2019-01-14 DIAGNOSIS — Z5321 Procedure and treatment not carried out due to patient leaving prior to being seen by health care provider: Secondary | ICD-10-CM | POA: Diagnosis not present

## 2019-01-14 DIAGNOSIS — G501 Atypical facial pain: Secondary | ICD-10-CM | POA: Diagnosis present

## 2019-01-14 NOTE — Telephone Encounter (Signed)
Phone visit scheduled in the morning and patient is aware

## 2019-01-14 NOTE — Telephone Encounter (Signed)
Patient called to report significant nausea with vomiting, facial burning.  She recently started a course of doxycycline and prednisone.  She is taking her medications with food.  COVID-19 confirmation testing is pending.  Patient has significant history of CAD, COPD and will recommend that she get an evaluation in the emergency room for possible worsening Covid or other acute cardiopulmonary process.  RN Federica spoke with patient to make recommendation, patient was agreeable to this.

## 2019-01-14 NOTE — ED Triage Notes (Signed)
Pt reports facial pain and burning that started today. Pt reports being started on prednisone and antibiotic yesterday for suspected covid -19. Pt denies exposure to covid. Pt denies sob at this time. Pt reports cough that is improving. No facial swelling, no rash, no difficulty swallowing.

## 2019-01-14 NOTE — Telephone Encounter (Signed)
Pt can schedule a telephone visit.  Recommend always taking prednisone with food.

## 2019-01-14 NOTE — Telephone Encounter (Signed)
Routing to Dr. Corum for Advice? 

## 2019-01-14 NOTE — Telephone Encounter (Signed)
Patient is calling and was sent to Urgent Care by Dr. Holly Bodily on 12/7, she states her rapid covid test was negative but the other covid test has not came back yet. She was told to follow up and let Dr. Holly Bodily know how she was feeling today, and she states her breathing is still not great and the prednisone she was prescribed makes her nausea and vomit.   CB# [phone]

## 2019-01-15 ENCOUNTER — Ambulatory Visit (INDEPENDENT_AMBULATORY_CARE_PROVIDER_SITE_OTHER): Payer: Medicaid Other | Admitting: Otolaryngology

## 2019-01-15 ENCOUNTER — Emergency Department (HOSPITAL_COMMUNITY)
Admission: EM | Admit: 2019-01-15 | Discharge: 2019-01-15 | Disposition: A | Discharge status: Home / Self Care | Payer: Medicaid Other | Attending: Emergency Medicine | Admitting: Emergency Medicine

## 2019-01-15 ENCOUNTER — Telehealth (INDEPENDENT_AMBULATORY_CARE_PROVIDER_SITE_OTHER): Payer: Medicaid Other | Admitting: Family Medicine

## 2019-01-15 DIAGNOSIS — R110 Nausea: Secondary | ICD-10-CM | POA: Diagnosis not present

## 2019-01-15 LAB — NOVEL CORONAVIRUS, NAA

## 2019-01-15 NOTE — Progress Notes (Signed)
Virtual Visit via Telephone Note  I connected with Melissa Garcia on 01/15/19 at  9:00 AM EST by telephone and verified that I am speaking with the correct person using two identifiers.DOB/SOB  Location: Patient: home Provider: clinic I discussed the limitations, risks, security and privacy concerns of performing an evaluation and management service by telephone and the availability of in person appointments. I also discussed with the patient that there may be a patient responsible charge related to this service. The patient expressed understanding and agreed to proceed.   History of Present Illness: Pt seen in UC with Prednisone and Doxy started for sinus infection. Pt states she has experienced nausea with medication and did not take them yesterday.  No fever, No cough, No headache, body aches and congestion.   Observations/Objective: No vital signs  Assessment and Plan:  1. Nausea Always take medication with food. Spread out predisone and doxy not taking the two medications together. Sun sensitivity d/w about doxy Follow Up Instructions: Call for rash, continued intolerance to medication for additional evaluation   I discussed the assessment and treatment plan with the patient. The patient was provided an opportunity to ask questions and all were answered. The patient agreed with the plan and demonstrated an understanding of the instructions.   The patient was advised to call back or seek an in-person evaluation if the symptoms worsen or if the condition fails to improve as anticipated.  I provided 81minutes of non-face-to-face time during this encounter.   Jayceon Troy Hannah Beat, MD

## 2019-01-15 NOTE — Patient Instructions (Signed)
Virtual Visit via Telephone Note  I connected with [name] on @TODAY @ at  9:00 AM EST by telephone and verified that I am speaking with the correct person using two identifiers.  Location: Patient: home Provider: clinic   I discussed the limitations, risks, security and privacy concerns of performing an evaluation and management service by telephone and the availability of in person appointments. I also discussed with the patient that there may be a patient responsible charge related to this service. The patient expressed understanding and agreed to proceed.   History of Present Illness:   pt with nausea noted after taking doxy and prednisone. Pt states she did not take medication yesterday due to symptoms. Pt had taken prednisone the past but not doxy. No headache, no fever. Congestion-treatment for sinus infection per pt Observations/Objective:  No vital signs Assessment and Plan: 1. Nausea Take all medication with food Avoid taking doxy and prednisone together Avoid sun while taking doxy-sun sensitivity Stop for rash, swelling, SOB or other concerns Follow Up Instructions: Call if medications not tolerable due to side effects   I discussed the assessment and treatment plan with the patient. The patient was provided an opportunity to ask questions and all were answered. The patient agreed with the plan and demonstrated an understanding of the instructions.   The patient was advised to call back or seek an in-person evaluation if the symptoms worsen or if the condition fails to improve as anticipated.  I provided 59minutes of non-face-to-face time during this encounter.   LISA Hannah Beat, MD

## 2019-01-16 ENCOUNTER — Telehealth: Payer: Self-pay | Admitting: Emergency Medicine

## 2019-01-16 MED ORDER — ONDANSETRON HCL 4 MG PO TABS: 4.0000 mg | ORAL_TABLET | Freq: Four times a day (QID) | ORAL | 0 refills | Status: DC

## 2019-01-16 MED ORDER — AMOXICILLIN-POT CLAVULANATE 875-125 MG PO TABS: 1.0000 | ORAL_TABLET | Freq: Two times a day (BID) | ORAL | 0 refills | Status: DC

## 2019-01-16 NOTE — Telephone Encounter (Signed)
Patient cannot tolerate doxycycline.  Reports red face, nausea, and vomiting.  Pt will discontinue and start augmentin for sinus infection.  Instructed to go to ED if she did not have improvement in symptoms recommended to go to the ED for further evaluation and work-up.

## 2019-01-17 ENCOUNTER — Telehealth: Payer: Self-pay

## 2019-01-17 MED ORDER — AMOXICILLIN-POT CLAVULANATE 875-125 MG PO TABS: 1.0000 | ORAL_TABLET | Freq: Two times a day (BID) | ORAL | 0 refills | Status: AC

## 2019-01-17 MED ORDER — ONDANSETRON HCL 4 MG PO TABS: 4.0000 mg | ORAL_TABLET | Freq: Four times a day (QID) | ORAL | 0 refills | Status: DC

## 2019-01-17 NOTE — Telephone Encounter (Signed)
Pt called to change pharmacy. Pharmacy changed.

## 2019-01-19 DIAGNOSIS — R110 Nausea: Secondary | ICD-10-CM | POA: Insufficient documentation

## 2019-02-21 ENCOUNTER — Inpatient Hospital Stay
Admission: RE | Admit: 2019-02-21 | Discharge: 2019-02-21 | Disposition: A | Discharge status: Home / Self Care | Payer: Medicaid Other | Source: Ambulatory Visit

## 2019-02-21 ENCOUNTER — Telehealth: Payer: Self-pay

## 2019-02-22 ENCOUNTER — Telehealth: Payer: Medicaid Other

## 2019-02-23 NOTE — Telephone Encounter (Signed)
Called pt. To check in for virtual twice before appt. No answer both times

## 2019-04-14 ENCOUNTER — Emergency Department (HOSPITAL_COMMUNITY)
Admission: EM | Admit: 2019-04-14 | Discharge: 2019-04-14 | Discharge status: Against Medical Advice | Payer: Medicaid Other | Attending: Emergency Medicine | Admitting: Emergency Medicine

## 2019-04-14 DIAGNOSIS — M79605 Pain in left leg: Secondary | ICD-10-CM | POA: Insufficient documentation

## 2019-04-14 DIAGNOSIS — Z5321 Procedure and treatment not carried out due to patient leaving prior to being seen by health care provider: Secondary | ICD-10-CM | POA: Diagnosis not present

## 2019-04-14 NOTE — ED Notes (Signed)
Called for triage x2 with no response

## 2019-04-14 NOTE — ED Notes (Signed)
Called for triage x1 with no response.

## 2019-04-14 NOTE — ED Notes (Signed)
Called for triage x3 with no response

## 2019-07-03 ENCOUNTER — Emergency Department (HOSPITAL_COMMUNITY): Payer: Medicaid Other

## 2019-07-03 ENCOUNTER — Other Ambulatory Visit: Payer: Self-pay

## 2019-07-03 ENCOUNTER — Encounter (HOSPITAL_COMMUNITY): Payer: Self-pay | Admitting: *Deleted

## 2019-07-03 ENCOUNTER — Emergency Department (HOSPITAL_COMMUNITY)
Admission: EM | Admit: 2019-07-03 | Discharge: 2019-07-04 | Disposition: A | Discharge status: Home / Self Care | Payer: Medicaid Other | Attending: Emergency Medicine | Admitting: Emergency Medicine

## 2019-07-03 DIAGNOSIS — Z7982 Long term (current) use of aspirin: Secondary | ICD-10-CM | POA: Diagnosis not present

## 2019-07-03 DIAGNOSIS — Z79899 Other long term (current) drug therapy: Secondary | ICD-10-CM | POA: Diagnosis not present

## 2019-07-03 DIAGNOSIS — J449 Chronic obstructive pulmonary disease, unspecified: Secondary | ICD-10-CM | POA: Diagnosis not present

## 2019-07-03 DIAGNOSIS — I2510 Atherosclerotic heart disease of native coronary artery without angina pectoris: Secondary | ICD-10-CM | POA: Insufficient documentation

## 2019-07-03 DIAGNOSIS — F17210 Nicotine dependence, cigarettes, uncomplicated: Secondary | ICD-10-CM | POA: Insufficient documentation

## 2019-07-03 DIAGNOSIS — I10 Essential (primary) hypertension: Secondary | ICD-10-CM | POA: Diagnosis not present

## 2019-07-03 DIAGNOSIS — R002 Palpitations: Secondary | ICD-10-CM

## 2019-07-03 LAB — CBC
HCT: 38 % (ref 36.0–46.0)
Hemoglobin: 12.8 g/dL (ref 12.0–15.0)
MCH: 31.9 pg (ref 26.0–34.0)
MCHC: 33.7 g/dL (ref 30.0–36.0)
MCV: 94.8 fL (ref 80.0–100.0)
Platelets: 234 10*3/uL (ref 150–400)
RBC: 4.01 MIL/uL (ref 3.87–5.11)
RDW: 12.6 % (ref 11.5–15.5)
WBC: 8.4 10*3/uL (ref 4.0–10.5)
nRBC: 0 % (ref 0.0–0.2)

## 2019-07-03 LAB — TROPONIN I (HIGH SENSITIVITY): Troponin I (High Sensitivity): 5 ng/L (ref <18)

## 2019-07-03 LAB — BASIC METABOLIC PANEL
Anion gap: 10 (ref 5–15)
BUN: 7 mg/dL (ref 6–20)
CO2: 26 mmol/L (ref 22–32)
Calcium: 9.5 mg/dL (ref 8.9–10.3)
Chloride: 105 mmol/L (ref 98–111)
Creatinine, Ser: 0.82 mg/dL (ref 0.44–1.00)
GFR calc Af Amer: >60 mL/min (ref >60)
GFR calc non Af Amer: >60 mL/min (ref >60)
Glucose, Bld: 122 mg/dL — ABNORMAL HIGH (ref 70–99)
Potassium: 3.9 mmol/L (ref 3.5–5.1)
Sodium: 141 mmol/L (ref 135–145)

## 2019-07-03 MED ORDER — SODIUM CHLORIDE 0.9% FLUSH: 3.0000 mL | Freq: Once | INTRAVENOUS | Status: DC

## 2019-07-03 NOTE — ED Triage Notes (Addendum)
Pt from home for fluttering in her chest with Sob this afternoon. Pt reports she is wearing a heart monitor due to dizzy spells in the past. She went to her PCP today, received a tetanus injection today.She was home this afternoon, felt a fluttering feeling and received a phone call from the halter monitor stating she had "a critical high heart rate."  Pt reports chest pain is intermittent and radiates from center chest to back then resolves

## 2019-07-04 LAB — TROPONIN I (HIGH SENSITIVITY): Troponin I (High Sensitivity): 5 ng/L (ref <18)

## 2019-07-04 MED ORDER — IPRATROPIUM BROMIDE HFA 17 MCG/ACT IN AERS
2.0000 | INHALATION_SPRAY | Freq: Once | RESPIRATORY_TRACT | Status: AC
  Administered 2019-07-04: 2 via RESPIRATORY_TRACT
  Filled 2019-07-04: qty 12.9

## 2019-07-04 MED ORDER — ALBUTEROL SULFATE HFA 108 (90 BASE) MCG/ACT IN AERS
6.0000 | INHALATION_SPRAY | Freq: Once | RESPIRATORY_TRACT | Status: AC
  Administered 2019-07-04: 6 via RESPIRATORY_TRACT
  Filled 2019-07-04: qty 6.7

## 2019-07-04 NOTE — ED Provider Notes (Signed)
[facility] Provider Note  CSN: [id number] Arrival date & time: 07/03/19 2132  Chief Complaint(s) Palpitations  HPI Melissa Garcia is a 59 y.o. female   HPI CC: palpitation  Onset/Duration: a few months, lasting a few seconds each tome Timing: sporadic Location: chest Quality: flutering Severity: mild to moderate Modifying Factors:  Improved by: self resolves   Worsened by: nothing Associated Signs/Symptoms:  Pertinent (+): light-headedness, gen fatigue  Pertinent (-): fevers, chills, n/v, chest pain, sob, abd pain Context: Today's episodes was worse. Reports tdap shot today.   Was called by on call RN and told to be evaluated for palpitations  Past Medical History Past Medical History:  Diagnosis Date  . Bipolar disorder ([place])   . COPD (chronic obstructive pulmonary disease) ([place])   . Coronary atherosclerosis of native coronary artery    a. 09/2008 Ant STEMI/VF Arrest/PCI: LM nl, LAD 41m (2.75x23 Xience DES), D1 small, D2 90ost (PTCA), LCX small, min irregs, RI nl, RCA 20-30 diff, EF 30%;  b. 11/2013 Lexi MV: EF 57%, no ischemia.  . Eczema   . Essential hypertension, benign   . Fibroids   . History of Ischemic Cardiomyopathy - resolved    a. 09/2008 EF 30% @ time of MI;  b. 05/2012 Echo: EF 55-60%, no rwma.  . History of UTI 10/29/2013  . Strep throat   . Stress incontinence   . Tobacco abuse    Patient Active Problem List   Diagnosis Date Noted  . Nausea 01/19/2019  . Cough, persistent 01/12/2019  . Lymphadenopathy 01/07/2019  . Gastroesophageal reflux disease without esophagitis 09/24/2018  . Coronary atherosclerosis of native coronary artery   . History of Ischemic Cardiomyopathy - resolved   . Tobacco abuse   . 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 Thrush 01/19/2014  . Incontinence in female 11/25/2013  . Burning with urination 10/29/2013  . Urinary incontinence, mixed 10/29/2013  . Hematuria 10/29/2013  . UTI (lower urinary  tract infection) 10/29/2013  . Stress incontinence in female 08/26/2013  . Essential hypertension 08/04/2013  . Dermatitis 07/15/2013  . Endometrial polyp 07/15/2013  . Fibroids 07/08/2013  . Post-menopausal bleeding 07/08/2013  . ASCUS Pap w/ Neg HRHPV 06/30/2013  . Uterine fibroids 06/30/2013  . Postmenopausal bleeding 06/22/2013  . COPD exacerbation ([place]) 05/12/2013  . Stress incontinence   . COPD (chronic obstructive pulmonary disease) ([place])   . Essential hypertension, benign 06/06/2011  . Mixed hyperlipidemia 06/06/2011  . CAD (coronary artery disease), native coronary artery 11/01/2010  . EMPHYSEMA 09/11/2007  . TOBACCO ABUSE 04/25/2007  . BIPOLAR AFFECTIVE DISORDER, DEPRESSED 09/17/2006   Home Medication(s) Prior to Admission medications   Medication Sig Start Date End Date Taking? Authorizing Provider  albuterol (VENTOLIN HFA) 108 (90 Base) MCG/ACT inhaler Inhale 1-2 puffs into the lungs every 6 (six) hours as needed for wheezing or shortness of breath. 01/12/19   Wurst, [place], PA-C  aspirin EC 81 MG tablet Take 81 mg by mouth daily.      [provider]  budesonide-formoterol (SYMBICORT) 80-4.5 MCG/ACT inhaler Inhale 2 puffs into the lungs 2 (two) times daily. 09/24/18   Corum, Rex Kras, MD  cetirizine (ZYRTEC) 10 MG tablet Take 10 mg by mouth daily.    [provider]  hydrOXYzine (ATARAX/VISTARIL) 25 MG tablet Take 25 mg by mouth 2 (two) times daily.    [provider]  ibuprofen (ADVIL,MOTRIN) 800 MG tablet Take 800 mg by mouth every 8 (eight) hours as needed for mild  pain.    [provider]  OLANZapine (ZYPREXA) 2.5 MG tablet Take 2.5 mg by mouth at bedtime.    [provider]  omeprazole (PRILOSEC) 40 MG capsule TAKE 1 CAPSULE BY MOUTH EVERY DAY 12/15/18   Corum, Rex Kras, MD  ondansetron (ZOFRAN) 4 MG tablet Take 1 tablet (4 mg total) by mouth every 6 (six) hours. 01/17/19   Wurst, [place], PA-C  predniSONE (STERAPRED UNI-PAK  21 TAB) 10 MG (21) TBPK tablet Take by mouth daily. Take 6 tabs by mouth daily  for 2 days, then 5 tabs for 2 days, then 4 tabs for 2 days, then 3 tabs for 2 days, 2 tabs for 2 days, then 1 tab by mouth daily for 2 days 01/12/19   Stacey Drain, [place], PA-C  sertraline (ZOLOFT) 50 MG tablet Take 1 tablet (50 mg total) by mouth daily. Patient taking differently: Take 100 mg by mouth daily.  06/01/13   Orlena Sheldon, PA-C  nitroGLYCERIN (NITROSTAT) 0.4 MG SL tablet Place 1 tablet (0.4 mg total) under the tongue every 5 (five) minutes as needed for chest pain. Patient not taking: Reported on 10/16/2018 02/19/14 01/12/19  Theora Gianotti, NP                                                                                                                                    Past Surgical History Past Surgical History:  Procedure Laterality Date  . APPENDECTOMY    . CATARACT EXTRACTION W/PHACO Left 01/26/2013   Procedure: LEFT EYE CATARACT EXTRACTION PHACO AND INTRAOCULAR LENS PLACEMENT  CDE=6.68;  Surgeon: Tonny Branch, MD;  Location: [facility];  Service: Ophthalmology;  Laterality: Left;  . CATARACT EXTRACTION W/PHACO Right 01/08/2013   Procedure: CATARACT EXTRACTION PHACO AND INTRAOCULAR LENS PLACEMENT ([place]);  Surgeon: Tonny Branch, MD;  Location: [facility];  Service: Ophthalmology;  Laterality: Right;  CDE 9.04  . CORONARY STENT PLACEMENT     heart stent  . EYE SURGERY Bilateral 01/2013   cataracts  . TUBAL LIGATION     Family History Family History  Problem Relation Age of Onset  . Cancer Father        lung  . Cancer Mother        skin  . Stroke Mother   . Hypertension Mother   . Birth defects Sister        born with hole in her heart    Social History Social History   Tobacco Use  . Smoking status: Current Every Day Smoker    Packs/day: 0.50    Years: 30.00    Pack years: 15.00    Types: Cigarettes  . Smokeless tobacco: Never Used  Substance Use Topics  . Alcohol use: No  . Drug use:  No   Allergies Codeine, Depakote [divalproex sodium], Sulfonamide derivatives, and Toviaz [fesoterodine fumarate er]  Review of Systems Review of Systems All other systems are reviewed and  are negative for acute change except as noted in the HPI  Physical Exam Vital Signs  I have reviewed the triage vital signs BP (!) 151/81 (BP Location: Left Arm)   Pulse 75   Temp 97.9 F (36.6 C) (Oral)   Resp 18   LMP 08/31/2010   SpO2 96%   Physical Exam Vitals reviewed.  Constitutional:      General: She is not in acute distress.    Appearance: She is well-developed. She is not diaphoretic.  HENT:     Head: Normocephalic and atraumatic.     Nose: Nose normal.  Eyes:     General: No scleral icterus.       Right eye: No discharge.        Left eye: No discharge.     Conjunctiva/sclera: Conjunctivae normal.     Pupils: Pupils are equal, round, and reactive to light.  Cardiovascular:     Rate and Rhythm: Normal rate and regular rhythm.     Heart sounds: No murmur. No friction rub. No gallop.   Pulmonary:     Effort: Pulmonary effort is normal. No respiratory distress.     Breath sounds: No stridor. Wheezing present. No rales.  Abdominal:     General: There is no distension.     Palpations: Abdomen is soft.     Tenderness: There is no abdominal tenderness.  Musculoskeletal:        General: No tenderness.     Cervical back: Normal range of motion and neck supple.  Skin:    General: Skin is warm and dry.     Findings: No erythema or rash.  Neurological:     Mental Status: She is alert and oriented to person, place, and time.     ED Results and Treatments Labs (all labs ordered are listed, but only abnormal results are displayed) Labs Reviewed  BASIC METABOLIC PANEL - Abnormal; Notable for the following components:      Result Value   Glucose, Bld 122 (*)    All other components within normal limits  CBC  TROPONIN I (HIGH SENSITIVITY)  TROPONIN I (HIGH SENSITIVITY)                                                                                                                          EKG  EKG Interpretation  Date/Time:  Friday Jul 03 2019 21:51:02 EDT Ventricular Rate:  81 PR Interval:  170 QRS Duration: 84 QT Interval:  370 QTC Calculation: 429 R Axis:   69 Text Interpretation: Normal sinus rhythm Nonspecific T wave abnormality Abnormal ECG No significant change since last tracing Confirmed by Addison Lank [phone]) on 07/04/2019 5:42:54 AM      Radiology DG Chest 2 View  Result Date: 07/03/2019 CLINICAL DATA:  Shortness of breath EXAM: CHEST - 2 VIEW COMPARISON:  [id number] FINDINGS: Metallic artifact over the left upper chest. No focal opacity or pleural effusion. Normal cardiomediastinal silhouette. No pneumothorax. Aortic atherosclerosis. IMPRESSION:  No active cardiopulmonary disease. Electronically Signed   By: Donavan Foil M.D.   On: 07/03/2019 22:16    Pertinent labs & imaging results that were available during my care of the patient were reviewed by me and considered in my medical decision making (see chart for details).  Medications Ordered in ED Medications  sodium chloride flush (NS) 0.9 % injection 3 mL (has no administration in time range)  albuterol (VENTOLIN HFA) 108 (90 Base) MCG/ACT inhaler 6 puff (6 puffs Inhalation Given 07/04/19 0631)  ipratropium (ATROVENT HFA) inhaler 2 puff (2 puffs Inhalation Given 07/04/19 0631)                                                                                                                                    Procedures .1-3 Lead EKG Interpretation Performed by: Fatima Blank, MD Authorized by: Fatima Blank, MD     Interpretation: normal     ECG rate:  78   ECG rate assessment: normal     Rhythm: sinus rhythm     Ectopy: none     Conduction: normal      (including critical care time)  Medical Decision Making / ED Course I have reviewed the nursing notes for this  encounter and the patient's prior records (if available in EHR or on provided paperwork).   Melissa Garcia was evaluated in Emergency Department on 07/04/2019 for the symptoms described in the history of present illness. She was evaluated in the context of the global COVID-19 pandemic, which necessitated consideration that the patient might be at risk for infection with the SARS-CoV-2 virus that causes COVID-19. Institutional protocols and algorithms that pertain to the evaluation of patients at risk for COVID-19 are in a state of rapid change based on information released by regulatory bodies including the CDC and federal and state organizations. These policies and algorithms were followed during the patient's care in the ED.  Sent for palpitation.  Has external heart monitor. Not able to interrogate.  Telemetry w/o dysrhythmias. EKG without acute ischemic changes or pericarditis. Patient is asymptomatic. Labs without leukocytosis or anemia.  No significant electrolyte derangements or renal sufficiency. Cardiac markers negative x2. Chest x-ray without evidence suggestive of pneumonia, pneumothorax, pneumomediastinum.  No abnormal contour of the mediastinum to suggest dissection. No evidence of acute injuries.   Noted to have wheezing on exam. Improved with puffs.      Final Clinical Impression(s) / ED Diagnoses Final diagnoses:  Palpitations   The patient appears reasonably screened and/or stabilized for discharge and I doubt any other medical condition or other [facility] requiring further screening, evaluation, or treatment in the ED at this time prior to discharge. Safe for discharge with strict return precautions.  Disposition: Discharge  Condition: Good  I have discussed the results, Dx and Tx plan with the patient/family who expressed understanding and agree(s) with the plan. Discharge instructions discussed at length. The  patient/family was given strict return precautions who  verbalized understanding of the instructions. No further questions at time of discharge.    ED Discharge Orders    None       Follow Up: Maryruth Hancock, [place] [address] [phone]  Call       This chart was dictated using voice recognition software.  Despite best efforts to proofread,  errors can occur which can change the documentation meaning.   Fatima Blank, MD 07/04/19 [phone]

## 2019-07-04 NOTE — ED Notes (Signed)
Patient verbalizes understanding of discharge instructions. Opportunity for questioning and answers were provided. Armband removed by staff, pt discharged from ED. Ambulated out to lobby  

## 2019-09-25 ENCOUNTER — Encounter (HOSPITAL_COMMUNITY): Payer: Self-pay | Admitting: Emergency Medicine

## 2019-09-25 ENCOUNTER — Emergency Department (HOSPITAL_COMMUNITY): Payer: Medicaid Other

## 2019-09-25 ENCOUNTER — Emergency Department (HOSPITAL_COMMUNITY)
Admission: EM | Admit: 2019-09-25 | Discharge: 2019-09-25 | Disposition: A | Discharge status: Home / Self Care | Payer: Medicaid Other | Attending: Emergency Medicine | Admitting: Emergency Medicine

## 2019-09-25 ENCOUNTER — Other Ambulatory Visit: Payer: Self-pay

## 2019-09-25 DIAGNOSIS — I2510 Atherosclerotic heart disease of native coronary artery without angina pectoris: Secondary | ICD-10-CM | POA: Insufficient documentation

## 2019-09-25 DIAGNOSIS — Z951 Presence of aortocoronary bypass graft: Secondary | ICD-10-CM | POA: Diagnosis not present

## 2019-09-25 DIAGNOSIS — Z7982 Long term (current) use of aspirin: Secondary | ICD-10-CM | POA: Diagnosis not present

## 2019-09-25 DIAGNOSIS — F17210 Nicotine dependence, cigarettes, uncomplicated: Secondary | ICD-10-CM | POA: Insufficient documentation

## 2019-09-25 DIAGNOSIS — I10 Essential (primary) hypertension: Secondary | ICD-10-CM | POA: Insufficient documentation

## 2019-09-25 DIAGNOSIS — J441 Chronic obstructive pulmonary disease with (acute) exacerbation: Secondary | ICD-10-CM | POA: Insufficient documentation

## 2019-09-25 DIAGNOSIS — R5383 Other fatigue: Secondary | ICD-10-CM | POA: Diagnosis not present

## 2019-09-25 DIAGNOSIS — R519 Headache, unspecified: Secondary | ICD-10-CM | POA: Diagnosis present

## 2019-09-25 DIAGNOSIS — Z79899 Other long term (current) drug therapy: Secondary | ICD-10-CM | POA: Diagnosis not present

## 2019-09-25 DIAGNOSIS — U071 COVID-19: Secondary | ICD-10-CM | POA: Diagnosis not present

## 2019-09-25 DIAGNOSIS — H9209 Otalgia, unspecified ear: Secondary | ICD-10-CM | POA: Diagnosis not present

## 2019-09-25 LAB — SARS CORONAVIRUS 2 BY RT PCR (HOSPITAL ORDER, PERFORMED IN ~~LOC~~ HOSPITAL LAB): SARS Coronavirus 2: POSITIVE — AB

## 2019-09-25 MED ORDER — ONDANSETRON HCL 4 MG PO TABS
4.0000 mg | ORAL_TABLET | Freq: Four times a day (QID) | ORAL | 0 refills | Status: DC
Start: 2019-09-25 — End: 2020-08-29

## 2019-09-25 MED ORDER — ONDANSETRON HCL 4 MG PO TABS
4.0000 mg | ORAL_TABLET | Freq: Once | ORAL | Status: AC
  Administered 2019-09-25: 4 mg via ORAL
  Filled 2019-09-25: qty 1

## 2019-09-25 NOTE — ED Notes (Signed)
Call to lab re: covid result positive

## 2019-09-25 NOTE — Discharge Instructions (Addendum)
Your Covid test today was.  You may take ibuprofen/Tylenol for body aches and fevers, drink plenty of fluids, over-the-counter cold and flu medications. You may also buy an over-the-counter pulse oximeter which can be found at your local pharmacy. If your oxygen saturation drops below 90%, please make sure to return to the ER. Return to the ER for any worsening shortness of breath.  You may receive a call about scheduling an infusion as part of treatment for the COVID-19 virus.     Person for the COVID-19 virus. Under Monitoring Name: Melissa Garcia  Location: [place] [address]   Infection Prevention Recommendations for Individuals Confirmed to have, or Being Evaluated for, 2019 Novel Coronavirus (COVID-19) Infection Who Receive Care at Home  Individuals who are confirmed to have, or are being evaluated for, COVID-19 should follow the prevention steps below until a healthcare provider or local or state health department says they can return to normal activities.  Stay home except to get medical care You should restrict activities outside your home, except for getting medical care. Do not go to work, school, or public areas, and do not use public transportation or taxis.  Call ahead before visiting your doctor Before your medical appointment, call the healthcare provider and tell them that you have, or are being evaluated for, COVID-19 infection. This will help the healthcare provider's office take steps to keep other people from getting infected. Ask your healthcare provider to call the local or state health department.  Monitor your symptoms Seek prompt medical attention if your illness is worsening (e.g., difficulty breathing). Before going to your medical appointment, call the healthcare provider and tell them that you have, or are being evaluated for, COVID-19 infection. Ask your healthcare provider to call the local or state health department.  Wear a  facemask You should wear a facemask that covers your nose and mouth when you are in the same room with other people and when you visit a healthcare provider. People who live with or visit you should also wear a facemask while they are in the same room with you.  Separate yourself from other people in your home As much as possible, you should stay in a different room from other people in your home. Also, you should use a separate bathroom, if available.  Avoid sharing household items You should not share dishes, drinking glasses, cups, eating utensils, towels, bedding, or other items with other people in your home. After using these items, you should wash them thoroughly with soap and water.  Cover your coughs and sneezes Cover your mouth and nose with a tissue when you cough or sneeze, or you can cough or sneeze into your sleeve. Throw used tissues in a lined trash can, and immediately wash your hands with soap and water for at least 20 seconds or use an alcohol-based hand rub.  Wash your [facility] your hands often and thoroughly with soap and water for at least 20 seconds. You can use an alcohol-based hand sanitizer if soap and water are not available and if your hands are not visibly dirty. Avoid touching your eyes, nose, and mouth with unwashed hands.   Prevention Steps for Caregivers and Household Members of Individuals Confirmed to have, or Being Evaluated for, COVID-19 Infection Being Cared for in the Home  If you live with, or provide care at home for, a person confirmed to have, or being evaluated for, COVID-19 infection please follow these guidelines to prevent infection:  Follow healthcare provider's instructions Make sure that you understand and can help the patient follow any healthcare provider instructions for all care.  Provide for the patient's basic needs You should help the patient with basic needs in the home and provide support for getting groceries,  prescriptions, and other personal needs.  Monitor the patient's symptoms If they are getting sicker, call his or her medical provider and tell them that the patient has, or is being evaluated for, COVID-19 infection. This will help the healthcare provider's office take steps to keep other people from getting infected. Ask the healthcare provider to call the local or state health department.  Limit the number of people who have contact with the patient If possible, have only one caregiver for the patient. Other household members should stay in another home or place of residence. If this is not possible, they should stay in another room, or be separated from the patient as much as possible. Use a separate bathroom, if available. Restrict visitors who do not have an essential need to be in the home.  Keep older adults, very young children, and other sick people away from the patient Keep older adults, very young children, and those who have compromised immune systems or chronic health conditions away from the patient. This includes people with chronic heart, lung, or kidney conditions, diabetes, and cancer.  Ensure good ventilation Make sure that shared spaces in the home have good air flow, such as from an air conditioner or an opened window, weather permitting.  Wash your hands often Wash your hands often and thoroughly with soap and water for at least 20 seconds. You can use an alcohol based hand sanitizer if soap and water are not available and if your hands are not visibly dirty. Avoid touching your eyes, nose, and mouth with unwashed hands. Use disposable paper towels to dry your hands. If not available, use dedicated cloth towels and replace them when they become wet.  Wear a facemask and gloves Wear a disposable facemask at all times in the room and gloves when you touch or have contact with the patient's blood, body fluids, and/or secretions or excretions, such as sweat, saliva,  sputum, nasal mucus, vomit, urine, or feces.  Ensure the mask fits over your nose and mouth tightly, and do not touch it during use. Throw out disposable facemasks and gloves after using them. Do not reuse. Wash your hands immediately after removing your facemask and gloves. If your personal clothing becomes contaminated, carefully remove clothing and launder. Wash your hands after handling contaminated clothing. Place all used disposable facemasks, gloves, and other waste in a lined container before disposing them with other household waste. Remove gloves and wash your hands immediately after handling these items.  Do not share dishes, glasses, or other household items with the patient Avoid sharing household items. You should not share dishes, drinking glasses, cups, eating utensils, towels, bedding, or other items with a patient who is confirmed to have, or being evaluated for, COVID-19 infection. After the person uses these items, you should wash them thoroughly with soap and water.  Wash laundry thoroughly Immediately remove and wash clothes or bedding that have blood, body fluids, and/or secretions or excretions, such as sweat, saliva, sputum, nasal mucus, vomit, urine, or feces, on them. Wear gloves when handling laundry from the patient. Read and follow directions on labels of laundry or clothing items and detergent. In general, wash and dry with the warmest temperatures recommended on the label.  Clean all areas the individual has used often Clean all touchable surfaces, such as counters, tabletops, doorknobs, bathroom fixtures, toilets, phones, keyboards, tablets, and bedside tables, every day. Also, clean any surfaces that may have blood, body fluids, and/or secretions or excretions on them. Wear gloves when cleaning surfaces the patient has come in contact with. Use a diluted bleach solution (e.g., dilute bleach with 1 part bleach and 10 parts water) or a household disinfectant with a  label that says EPA-registered for coronaviruses. To make a bleach solution at home, add 1 tablespoon of bleach to 1 quart (4 cups) of water. For a larger supply, add  cup of bleach to 1 gallon (16 cups) of water. Read labels of cleaning products and follow recommendations provided on product labels. Labels contain instructions for safe and effective use of the cleaning product including precautions you should take when applying the product, such as wearing gloves or eye protection and making sure you have good ventilation during use of the product. Remove gloves and wash hands immediately after cleaning.  Monitor yourself for signs and symptoms of illness Caregivers and household members are considered close contacts, should monitor their health, and will be asked to limit movement outside of the home to the extent possible. Follow the monitoring steps for close contacts listed on the symptom monitoring form.   ? If you have additional questions, contact your local health department or call the epidemiologist on call at [phone] (available 24/7). ? This guidance is subject to change. For the most up-to-date guidance from [facility], please refer to their website: YouBlogs.pl

## 2019-09-25 NOTE — ED Triage Notes (Signed)
Pt reports she had a COVID exposure, she and her Mother were sent by urgent care; symptoms: throat hurting; bilateral ear pain; body aches; headaches; n/v/d x 4 days ago

## 2019-09-25 NOTE — ED Provider Notes (Addendum)
[facility] EMERGENCY DEPARTMENT Provider Note   CSN: [id number] Arrival date & time: 09/25/19  1627     History Chief Complaint  Patient presents with  . Covid Exposure    Melissa Garcia is a 59 y.o. female.  HPI 59 year old female with history of bipolar disorder, COPD, CAD, STEMI with stent placement, ischemic cardiomyopathy, tobacco abuse, hypertension presents to the ER with 4-day symptoms of body aches, headache, nausea vomiting and diarrhea along with sore throat.  Overall feeling unwell.  States her and her mother have been feeling the same.  They are both in vaccinated.  No known Covid contacts.  No abdominal pain, dysuria, back pain, chest pain, endorses some mild shortness of breath.  She and her mother presented to urgent care, and was sent here because her mother was hypoxic.  Urgent care encouraged her to be checked out here in the ER as well.  She denies being hypoxic in urgent care.    Past Medical History:  Diagnosis Date  . Bipolar disorder ([place])   . COPD (chronic obstructive pulmonary disease) ([place])   . Coronary atherosclerosis of native coronary artery    a. 09/2008 Ant STEMI/VF Arrest/PCI: LM nl, LAD 53m (2.75x23 Xience DES), D1 small, D2 90ost (PTCA), LCX small, min irregs, RI nl, RCA 20-30 diff, EF 30%;  b. 11/2013 Lexi MV: EF 57%, no ischemia.  . Eczema   . Essential hypertension, benign   . Fibroids   . History of Ischemic Cardiomyopathy - resolved    a. 09/2008 EF 30% @ time of MI;  b. 05/2012 Echo: EF 55-60%, no rwma.  . History of UTI 10/29/2013  . Strep throat   . Stress incontinence   . Tobacco abuse     Patient Active Problem List   Diagnosis Date Noted  . Nausea 01/19/2019  . Cough, persistent 01/12/2019  . Lymphadenopathy 01/07/2019  . Gastroesophageal reflux disease without esophagitis 09/24/2018  . Coronary atherosclerosis of native coronary artery   . History of Ischemic Cardiomyopathy - resolved   . Tobacco abuse   . 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 Thrush 01/19/2014  . Incontinence in female 11/25/2013  . Burning with urination 10/29/2013  . Urinary incontinence, mixed 10/29/2013  . Hematuria 10/29/2013  . UTI (lower urinary tract infection) 10/29/2013  . Stress incontinence in female 08/26/2013  . Essential hypertension 08/04/2013  . Dermatitis 07/15/2013  . Endometrial polyp 07/15/2013  . Fibroids 07/08/2013  . Post-menopausal bleeding 07/08/2013  . ASCUS Pap w/ Neg HRHPV 06/30/2013  . Uterine fibroids 06/30/2013  . Postmenopausal bleeding 06/22/2013  . COPD exacerbation ([place]) 05/12/2013  . Stress incontinence   . COPD (chronic obstructive pulmonary disease) ([place])   . Essential hypertension, benign 06/06/2011  . Mixed hyperlipidemia 06/06/2011  . CAD (coronary artery disease), native coronary artery 11/01/2010  . EMPHYSEMA 09/11/2007  . TOBACCO ABUSE 04/25/2007  . BIPOLAR AFFECTIVE DISORDER, DEPRESSED 09/17/2006    Past Surgical History:  Procedure Laterality Date  . APPENDECTOMY    . CATARACT EXTRACTION W/PHACO Left 01/26/2013   Procedure: LEFT EYE CATARACT EXTRACTION PHACO AND INTRAOCULAR LENS PLACEMENT  CDE=6.68;  Surgeon: Tonny Branch, MD;  Location: [facility];  Service: Ophthalmology;  Laterality: Left;  . CATARACT EXTRACTION W/PHACO Right 01/08/2013   Procedure: CATARACT EXTRACTION PHACO AND INTRAOCULAR LENS PLACEMENT ([place]);  Surgeon: Tonny Branch, MD;  Location: [facility];  Service: Ophthalmology;  Laterality: Right;  CDE 9.04  . CORONARY STENT PLACEMENT     heart stent  . EYE  SURGERY Bilateral 01/2013   cataracts  . TUBAL LIGATION       OB History    Gravida  5   Para  3   Term  0   Preterm  0   AB  2   Living  3     SAB  2   TAB  0   Ectopic  0   Multiple  0   Live Births  3           Family History  Problem Relation Age of Onset  . Cancer Father        lung  . Cancer Mother        skin  . Stroke Mother   . Hypertension Mother   . Birth defects Sister        born with  hole in her heart    Social History   Tobacco Use  . Smoking status: Current Every Day Smoker    Packs/day: 0.50    Years: 30.00    Pack years: 15.00    Types: Cigarettes  . Smokeless tobacco: Never Used  Substance Use Topics  . Alcohol use: No  . Drug use: No    Home Medications Prior to Admission medications   Medication Sig Start Date End Date Taking? Authorizing Provider  albuterol (VENTOLIN HFA) 108 (90 Base) MCG/ACT inhaler Inhale 1-2 puffs into the lungs every 6 (six) hours as needed for wheezing or shortness of breath. 01/12/19   Wurst, [place], PA-C  aspirin EC 81 MG tablet Take 81 mg by mouth daily.      [provider]  budesonide-formoterol (SYMBICORT) 80-4.5 MCG/ACT inhaler Inhale 2 puffs into the lungs 2 (two) times daily. 09/24/18   Corum, Rex Kras, MD  cetirizine (ZYRTEC) 10 MG tablet Take 10 mg by mouth daily.    [provider]  hydrOXYzine (ATARAX/VISTARIL) 25 MG tablet Take 25 mg by mouth 2 (two) times daily.    [provider]  ibuprofen (ADVIL,MOTRIN) 800 MG tablet Take 800 mg by mouth every 8 (eight) hours as needed for mild pain.    [provider]  OLANZapine (ZYPREXA) 2.5 MG tablet Take 2.5 mg by mouth at bedtime.    [provider]  omeprazole (PRILOSEC) 40 MG capsule TAKE 1 CAPSULE BY MOUTH EVERY DAY 12/15/18   Corum, Rex Kras, MD  ondansetron (ZOFRAN) 4 MG tablet Take 1 tablet (4 mg total) by mouth every 6 (six) hours. 01/17/19   Wurst, [place], PA-C  predniSONE (STERAPRED UNI-PAK 21 TAB) 10 MG (21) TBPK tablet Take by mouth daily. Take 6 tabs by mouth daily  for 2 days, then 5 tabs for 2 days, then 4 tabs for 2 days, then 3 tabs for 2 days, 2 tabs for 2 days, then 1 tab by mouth daily for 2 days 01/12/19   Stacey Drain, [place], PA-C  sertraline (ZOLOFT) 50 MG tablet Take 1 tablet (50 mg total) by mouth daily. Patient taking differently: Take 100 mg by mouth daily.  06/01/13   Orlena Sheldon, PA-C  nitroGLYCERIN (NITROSTAT)  0.4 MG SL tablet Place 1 tablet (0.4 mg total) under the tongue every 5 (five) minutes as needed for chest pain. Patient not taking: Reported on 10/16/2018 02/19/14 01/12/19  Theora Gianotti, NP    Allergies    Codeine, Depakote [divalproex sodium], Sulfonamide derivatives, and Toviaz [fesoterodine fumarate er]  Review of Systems   Review of Systems  Constitutional: Positive for appetite change, chills, fatigue and  fever.  HENT: Positive for ear pain and sore throat.   Eyes: Negative for pain and visual disturbance.  Respiratory: Positive for cough and shortness of breath.   Cardiovascular: Negative for chest pain and palpitations.  Gastrointestinal: Positive for diarrhea, nausea and vomiting. Negative for abdominal pain.  Genitourinary: Negative for dysuria and hematuria.  Musculoskeletal: Negative for arthralgias and back pain.  Skin: Negative for color change and rash.  Neurological: Negative for seizures and syncope.  All other systems reviewed and are negative.   Physical Exam Updated Vital Signs BP 101/64 (BP Location: Right Arm)   Pulse 100   Temp 98.4 F (36.9 C) (Oral)   Resp 16   Ht 5\' 3"  (1.6 m)   Wt 76.2 kg   LMP 08/31/2010   SpO2 95%   BMI 29.76 kg/m   Physical Exam Vitals and nursing note reviewed.  Constitutional:      General: She is not in acute distress.    Appearance: She is well-developed.     Comments: Chronically ill-appearing, in no acute distress, speaking full sentences but increased work of breathing  HENT:     Head: Normocephalic and atraumatic.     Right Ear: Tympanic membrane normal.     Left Ear: Tympanic membrane normal.     Mouth/Throat:     Mouth: Mucous membranes are moist.     Pharynx: Oropharynx is clear.     Comments: Oropharynx non erythematous without exudates, uvula midline, no unilateral tonsillar swelling, tongue normal size and midline, no sublingual/submandibular swellimg, tolerating secretions well    Eyes:      Conjunctiva/sclera: Conjunctivae normal.  Cardiovascular:     Rate and Rhythm: Normal rate and regular rhythm.     Pulses: Normal pulses.     Heart sounds: Normal heart sounds. No murmur heard.      Comments: Lung sounds decreased lung sounds decreased, no rales or rhonchi Pulmonary:     Effort: Pulmonary effort is normal. No respiratory distress.     Breath sounds: Normal breath sounds.  Abdominal:     General: Abdomen is flat.     Palpations: Abdomen is soft.     Tenderness: There is no abdominal tenderness.  Musculoskeletal:        General: Normal range of motion.     Cervical back: Normal range of motion and neck supple.  Skin:    General: Skin is warm and dry.     Findings: No erythema or rash.  Neurological:     General: No focal deficit present.     Mental Status: She is alert and oriented to person, place, and time.     Comments: No focal neuro deficits, moving all 4 extremities, no noticeable facial droop,  speaking clearly without evidence of aphasia or word slurring  Psychiatric:        Mood and Affect: Mood normal.        Behavior: Behavior normal.     ED Results / Procedures / Treatments   Labs (all labs ordered are listed, but only abnormal results are displayed) Labs Reviewed  SARS CORONAVIRUS 2 BY RT PCR (HOSPITAL ORDER, [facility]) - Abnormal; Notable for the following components:      Result Value   SARS Coronavirus 2 POSITIVE (*)    All other components within normal limits    EKG None  Radiology DG Chest Portable 1 View  Result Date: 09/25/2019 CLINICAL DATA:  Body aches, headaches, nausea, vomiting and diarrhea  for 4 days. History of COVID-19 exposure. EXAM: PORTABLE CHEST 1 VIEW COMPARISON:  PA and lateral chest 09/06/2019. FINDINGS: The lungs are emphysematous but clear. Heart size is normal. Aortic atherosclerosis. No pneumothorax or pleural fluid. No acute or focal bony abnormality. IMPRESSION: No acute disease. Aortic  Atherosclerosis (ICD10-I70.0) and Emphysema (ICD10-J43.9). Electronically Signed   By: Inge Rise M.D.   On: 09/25/2019 19:19    Procedures Procedures (including critical care time)  Medications Ordered in ED Medications - No data to display  ED Course  I have reviewed the triage vital signs and the nursing notes.  Pertinent labs & imaging results that were available during my care of the patient were reviewed by me and considered in my medical decision making (see chart for details).    MDM Rules/Calculators/A&P                         Patient presents with Covid-like symptoms.  Unvaccinated, no evidence of peritonsillar abscess, Ludwig's angina, retropharyngeal abscess.  No tripoding, no drooling, tolerate secretions well.  No gross neuro deficits.  Symptoms consistent with viral URI, confirmed with positive COVID-19 test.  Patient with reassuring vitals, oxygen saturation 95%, ambulated around the ED without evidence of hypoxia.  S x-ray without evidence of pneumonia.  Given the patient's history and comorbidities, she would likely be a good candidate for infusion.  Reached out to [facility] infusions to follow-up on the patient.  Patient informed that she may be receiving a call.  Strict return precautions discussed, patient has a pulse oximeter at home and was instructed to monitor her oxygen saturations.  Instructed to return to the ER if it drops below 90%.  Supportive measures discussed.  Patient endorse some nausea, received Zofran here in the ED and sent home with prescription.  She voices understanding and is agreeable.  At this stage in ED course, the patient's been medically screened and stable for discharge.  Melissa Garcia was evaluated in Emergency Department on 09/25/2019 for the symptoms described in the history of present illness. She was evaluated in the context of the global COVID-19 pandemic, which necessitated consideration that the patient might be at risk for infection with  the SARS-CoV-2 virus that causes COVID-19. Institutional protocols and algorithms that pertain to the evaluation of patients at risk for COVID-19 are in a state of rapid change based on information released by regulatory bodies including the CDC and federal and state organizations. These policies and algorithms were followed during the patient's care in the ED.   Final Clinical Impression(s) / ED Diagnoses Final diagnoses:  [id number]    Rx / DC Orders ED Discharge Orders    None           Lyndel Safe 09/25/19 2148    Milton Ferguson, MD 09/26/19 2209

## 2019-09-26 ENCOUNTER — Ambulatory Visit (HOSPITAL_COMMUNITY)
Admission: RE | Admit: 2019-09-26 | Discharge: 2019-09-26 | Disposition: A | Discharge status: Home / Self Care | Payer: Medicaid Other | Source: Ambulatory Visit | Attending: Pulmonary Disease | Admitting: Pulmonary Disease

## 2019-09-26 ENCOUNTER — Encounter: Payer: Self-pay | Admitting: Oncology

## 2019-09-26 ENCOUNTER — Other Ambulatory Visit (HOSPITAL_COMMUNITY): Payer: Self-pay | Admitting: Oncology

## 2019-09-26 DIAGNOSIS — U071 COVID-19: Secondary | ICD-10-CM

## 2019-09-26 DIAGNOSIS — J449 Chronic obstructive pulmonary disease, unspecified: Secondary | ICD-10-CM | POA: Insufficient documentation

## 2019-09-26 MED ORDER — FAMOTIDINE IN NACL 20-0.9 MG/50ML-% IV SOLN: 20.0000 mg | Freq: Once | INTRAVENOUS | Status: DC | PRN

## 2019-09-26 MED ORDER — METHYLPREDNISOLONE SODIUM SUCC 125 MG IJ SOLR: 125.0000 mg | Freq: Once | INTRAMUSCULAR | Status: DC | PRN

## 2019-09-26 MED ORDER — EPINEPHRINE 0.3 MG/0.3ML IJ SOAJ: 0.3000 mg | Freq: Once | INTRAMUSCULAR | Status: DC | PRN

## 2019-09-26 MED ORDER — DIPHENHYDRAMINE HCL 50 MG/ML IJ SOLN: 50.0000 mg | Freq: Once | INTRAMUSCULAR | Status: DC | PRN

## 2019-09-26 MED ORDER — ALBUTEROL SULFATE HFA 108 (90 BASE) MCG/ACT IN AERS: 2.0000 | INHALATION_SPRAY | Freq: Once | RESPIRATORY_TRACT | Status: DC | PRN

## 2019-09-26 MED ORDER — SODIUM CHLORIDE 0.9 % IV SOLN
1200.0000 mg | Freq: Once | INTRAVENOUS | Status: AC
  Administered 2019-09-26: 1200 mg via INTRAVENOUS
  Filled 2019-09-26: qty 10

## 2019-09-26 MED ORDER — SODIUM CHLORIDE 0.9 % IV SOLN: INTRAVENOUS | Status: DC | PRN

## 2019-09-26 NOTE — Discharge Instructions (Signed)

## 2019-09-26 NOTE — Progress Notes (Signed)
°  Diagnosis: COVID-19  Physician: Asencion Noble, MD  Procedure: Covid Infusion Clinic Med: casirivimab\imdevimab infusion - Provided patient with casirivimab\imdevimab fact sheet for patients, parents and caregivers prior to infusion.  Complications: No immediate complications noted.  Discharge: Discharged home   Melissa Garcia 09/26/2019

## 2019-09-26 NOTE — Progress Notes (Signed)
I connected by phone with Mrs. Kalis on 09/26/19 at 8:00am to discuss the potential use of an new treatment for mild to moderate COVID-19 viral infection in non-hospitalized patients.   This patient is a age/sex that meets the FDA criteria for Emergency Use Authorization of casirivimab\imdevimab.  Has a (+) direct SARS-CoV-2 viral test result 1. Has mild or moderate COVID-19  2. Is ? 61 years of age and weighs ? 40 kg 3. Is NOT hospitalized due to COVID-19 4. Is NOT requiring oxygen therapy or requiring an increase in baseline oxygen flow rate due to COVID-19 5. Is within 10 days of symptom onset 6. Has at least one of the high risk factor(s) for progression to severe COVID-19 and/or hospitalization as defined in EUA. ? Specific high risk criteria :COPD, CAD   Symptom onset  09/21/19   I have spoken and communicated the following to the patient or parent/caregiver:   1. FDA has authorized the emergency use of casirivimab\imdevimab for the treatment of mild to moderate COVID-19 in adults and pediatric patients with positive results of direct SARS-CoV-2 viral testing who are 79 years of age and older weighing at least 40 kg, and who are at high risk for progressing to severe COVID-19 and/or hospitalization.   2. The significant known and potential risks and benefits of casirivimab\imdevimab, and the extent to which such potential risks and benefits are unknown.   3. Information on available alternative treatments and the risks and benefits of those alternatives, including clinical trials.   4. Patients treated with casirivimab\imdevimab should continue to self-isolate and use infection control measures (e.g., wear mask, isolate, social distance, avoid sharing personal items, clean and disinfect "high touch" surfaces, and frequent handwashing) according to CDC guidelines.    5. The patient or parent/caregiver has the option to accept or refuse casirivimab\imdevimab .   After reviewing this  information with the patient, The patient agreed to proceed with receiving casirivimab\imdevimab infusion and will be provided a copy of the Fact sheet prior to receiving the infusion.Rulon Abide, AGNP-C [phone] ([place])

## 2020-08-29 ENCOUNTER — Other Ambulatory Visit: Payer: Self-pay | Admitting: Cardiology

## 2020-08-29 ENCOUNTER — Other Ambulatory Visit: Payer: Self-pay

## 2020-08-29 ENCOUNTER — Ambulatory Visit: Payer: Medicaid Other | Admitting: Cardiology

## 2020-08-29 ENCOUNTER — Encounter: Payer: Self-pay | Admitting: Cardiology

## 2020-08-29 VITALS — BP 134/76 | HR 70 | Temp 98.4°F | Resp 16 | Ht 63.0 in | Wt 164.0 lb

## 2020-08-29 DIAGNOSIS — R0789 Other chest pain: Secondary | ICD-10-CM

## 2020-08-29 DIAGNOSIS — F17210 Nicotine dependence, cigarettes, uncomplicated: Secondary | ICD-10-CM

## 2020-08-29 DIAGNOSIS — I25118 Atherosclerotic heart disease of native coronary artery with other forms of angina pectoris: Secondary | ICD-10-CM

## 2020-08-29 DIAGNOSIS — F17200 Nicotine dependence, unspecified, uncomplicated: Secondary | ICD-10-CM

## 2020-08-29 MED ORDER — NITROGLYCERIN 0.4 MG SL SUBL
0.4000 mg | SUBLINGUAL_TABLET | SUBLINGUAL | 3 refills | Status: AC | PRN
Start: 2020-08-29 — End: ?

## 2020-08-29 MED ORDER — NICOTINE POLACRILEX 2 MG MT GUM: 2.0000 mg | CHEWING_GUM | OROMUCOSAL | 2 refills | Status: DC | PRN

## 2020-08-29 MED ORDER — CARVEDILOL 6.25 MG PO TABS: 6.2500 mg | ORAL_TABLET | Freq: Two times a day (BID) | ORAL | 2 refills | Status: DC

## 2020-08-29 MED ORDER — ASPIRIN EC 81 MG PO TBEC: 81.0000 mg | DELAYED_RELEASE_TABLET | Freq: Every day | ORAL | 3 refills | Status: AC

## 2020-08-29 MED ORDER — ATORVASTATIN CALCIUM 20 MG PO TABS: 20.0000 mg | ORAL_TABLET | Freq: Every day | ORAL | 3 refills | Status: DC

## 2020-08-29 NOTE — Progress Notes (Addendum)
Patient referred by [name], [name], MD for CAD  Subjective:   Melissa Garcia, female    DOB: [birth year], 60 y.o.   MRN: [id number]   Chief Complaint  Patient presents with   Coronary Artery Disease   Hypertension   New Patient (Initial Visit)     HPI  60 y/o CAucasian female with CAD s/p prior anterior MI, PCI to LAD/Diag (2010), Cath 2022 showing occluded mid LAD stent, 80% D2 stenosis, h/o ischemic cardiomyopathy with recovered EF 55-60% (04/2019), h/o NSVT (07/2019), tobacco dependence, COPD, mild-mod AI, GERD, h/o COVID pneumonia (09/2019), anxiety, bipolar disorder.  Patient is here to seek second opinion with me. She was most recently seen by Dr. Marina Goodell with [facility]. Prior to that, she was followed by Tennis Must. Berry with [facility].  Patient lives with and takes care of her 63 year old mother with dementia.  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  For several months, she has experienced sharp shooting pains in her left upper chest and left arm, as well as left leg.  Episodes occur for few seconds and resolve on their own.  There is no correlation with physical exertion.  Patient has not experienced severe squeezing retrosternal chest pain similar to what she experienced at the time of her MI in 2010.  Patient is compliant with her cardiac regimen.  She has not been taking her bipolar medications recently.  She continues to smoke, however is down to 1-2 cigarettes/day.   Past Medical History:  Diagnosis Date   Bipolar disorder ([place])    COPD (chronic obstructive pulmonary disease) ([place])    Coronary atherosclerosis of native coronary artery    a. 09/2008 Ant STEMI/VF Arrest/PCI: LM nl, LAD 19m(2.75x23 Xience DES), D1 small, D2 90ost (PTCA), LCX small, min irregs, RI nl, RCA 20-30 diff, EF 30%;  b. 11/2013 Lexi MV: EF 57%, no ischemia.   Eczema    Essential hypertension, benign    Fibroids    History of Ischemic  Cardiomyopathy - resolved    a. 09/2008 EF 30% @ time of MI;  b. 05/2012 Echo: EF 55-60%, no rwma.   History of UTI 10/29/2013   Strep throat    Stress incontinence    Tobacco abuse      Past Surgical History:  Procedure Laterality Date   APPENDECTOMY     CATARACT EXTRACTION W/PHACO Left 01/26/2013   Procedure: LEFT EYE CATARACT EXTRACTION PHACO AND INTRAOCULAR LENS PLACEMENT  CDE=6.68;  Surgeon: KTonny Branch MD;  Location: [facility];  Service: Ophthalmology;  Laterality: Left;   CATARACT EXTRACTION W/PHACO Right 01/08/2013   Procedure: CATARACT EXTRACTION PHACO AND INTRAOCULAR LENS PLACEMENT ([facility]);  Surgeon: KTonny Branch MD;  Location: [facility];  Service: Ophthalmology;  Laterality: Right;  CDE 9.04   CORONARY STENT PLACEMENT     heart stent   EYE SURGERY Bilateral 01/2013   cataracts   TUBAL LIGATION       Social History   Tobacco Use  Smoking Status Every Day   Packs/day: 0.50   Years: 30.00   Pack years: 15.00   Types: Cigarettes  Smokeless Tobacco Never    Social History   Substance and Sexual Activity  Alcohol Use No     Family History  Problem Relation Age of Onset   Cancer Father        lung   Cancer Mother        skin  Stroke Mother    Hypertension Mother    Birth defects Sister        born with hole in her heart     Current Outpatient Medications on File Prior to Visit  Medication Sig Dispense Refill   albuterol (VENTOLIN HFA) 108 (90 Base) MCG/ACT inhaler Inhale 1-2 puffs into the lungs every 6 (six) hours as needed for wheezing or shortness of breath. 18 g 0   budesonide-formoterol (SYMBICORT) 80-4.5 MCG/ACT inhaler Inhale 2 puffs into the lungs 2 (two) times daily. 1 Inhaler 3   cholecalciferol (VITAMIN D3) 25 MCG (1000 UNIT) tablet Take 1,000 Units by mouth daily.     Cyanocobalamin (VITAMIN B-12) 5000 MCG SUBL Place under the tongue every 30 (thirty) days.     fluticasone (FLONASE) 50 MCG/ACT nasal spray Place 2 sprays into both nostrils daily.      pantoprazole (PROTONIX) 40 MG tablet Take 40 mg by mouth daily.     hydrOXYzine (ATARAX/VISTARIL) 25 MG tablet Take 25 mg by mouth 2 (two) times daily. (Patient not taking: Reported on 08/29/2020)     ibuprofen (ADVIL,MOTRIN) 800 MG tablet Take 800 mg by mouth every 8 (eight) hours as needed for mild pain. (Patient not taking: Reported on 08/29/2020)     LORazepam (ATIVAN) 1 MG tablet SMARTSIG:1 Tablet(s) By Mouth (Patient not taking: Reported on 08/29/2020)     OLANZapine (ZYPREXA) 2.5 MG tablet Take 2.5 mg by mouth at bedtime. (Patient not taking: Reported on 08/29/2020)     No current facility-administered medications on file prior to visit.    Cardiovascular and other pertinent studies:  EKG 08/29/2020: Sinus rhythm 65 bpm Old anteroseptal infarct  Coronary angiography 06/10/2020: LM: No significant disease, LAD: Prox 10-20% stenosis. 100% occlusion at mid LAD insert previously placed stent.           Patent D1           D2 small to medium size vessel, coming off previously stented segment in LAD, with 80% ostial disease.  LCx: Luminal irregularities but no significant obstructive disease. RCA: Diffuse 30 to 40% mid disease.  Nuclear stress test 05/2020: Moderate size anteroseptal and apical defect suggestive of small to moderate ischemia in the distal LAD distribution with possible small apical scar.  Small inferior defect likely secondary to GI interference and doubtful for tiny scar. GI interference and breast attenuation were noted and could decrease specificity of the study. Normal LV systolic function. Slightly dilated LV.  Echocardiogram 04/19/2019: Left Ventricle: Left ventricle is normal in size. Wall thickness is  normal. Systolic function is normal. EF: 55-60%. No regional wall motion  abnormalities noted. Doppler parameters indicate normal diastolic  function. There is no thrombus.    Aortic Valve: The aortic valve was not well visualized. The leaflets  exhibit normal  excursion. There is mild sclerosis. There is mild to  moderate regurgitation with centrally directed jet. There is no evidence  of aortic valve stenosis.    Aorta: The aortic root is normal in size. Ascending aorta normal in  size.  30 day event monitor 07/2019: 12 sec NSVT  Labs:  05/2020: Glucose 94. BUN/Cr >/0.78. GFR 85. Na/K 142/4.4 Chol ?, TG 137, HDL 38, LDL 70 A2C 5.5%    Recent labs: April-July 022: Glucose 92, BUN/Cr 7/0.84. EGFR 79. Na/K 142/4.3. Rest of the CMP normal H/H 13/40. MCV 89. Platelets 228 HbA1C 5.5% Chol 132, TG 137, HDL 38, LDL 70 TSH N/A   Review of Systems  Cardiovascular:  Positive for chest pain. Negative for dyspnea on exertion, leg swelling, palpitations and syncope.  Musculoskeletal:        Sharp shooting pain in her left arm and left leg, lasting for few seconds        Vitals:   08/29/20 1006  BP: 134/76  Pulse: 70  Resp: 16  Temp: 98.4 F (36.9 C)  SpO2: 95%     Body mass index is 29.05 kg/m. Filed Weights   08/29/20 1006  Weight: 164 lb (74.4 kg)     Objective:   Physical Exam Vitals and nursing note reviewed.  Constitutional:      General: She is not in acute distress. Neck:     Vascular: No JVD.  Cardiovascular:     Rate and Rhythm: Normal rate and regular rhythm.     Pulses: Normal pulses.     Heart sounds: Murmur heard.  Harsh midsystolic murmur is present with a grade of 1/6 at the upper right sternal border radiating to the neck.  Pulmonary:     Effort: Pulmonary effort is normal.     Breath sounds: Normal breath sounds. No wheezing or rales.  Musculoskeletal:     Right lower leg: No edema.     Left lower leg: No edema.         Assessment & Recommendations:   60 y/o CAucasian female with CAD s/p prior anterior MI, PCI to LAD/Diag (2010), Cath 2022 showing occluded mid LAD stent, 80% D2 stenosis, h/o ischemic cardiomyopathy with recovered EF 55-60% (04/2019), h/o NSVT (07/2019), tobacco dependence, COPD,  mild-mod AI, GERD, h/o COVID pneumonia (09/2019), anxiety, bipolar disorder.  CAD: No clear anginal symptoms at this time. Ischemia in LAD territory and occluded LAD stent on coronary angiogram 06/2020, no indication for intervention in absence of clear angina or heart failure symptoms. Continue aspirin 81 mg, atorvastatin 20 mg daily. Lipids well controlled Increased carvedilol to 6.25 mg bid Will obtain CD of her coronary angiogram (06/2020) from [facility]  Nicotine dependence: Tobacco cessation counseling:  - Currently smoking 1-2 cigarettes/day   - Patient was informed of the dangers of tobacco abuse including stroke, cancer, and MI, as well as benefits of tobacco cessation. - Patient is willing to quit at this time. - Approximately 5 mins were spent counseling patient cessation techniques. We discussed various methods to help quit smoking, including deciding on a date to quit, joining a support group, pharmacological agents. Patient would like to use nicotine gum. - I will reassess her progress at the next follow-up visit   Encouraged her to f/u w/her PCP re: bipolar disorder  While her visit today was to seek second opinion, she states she would establish cardiac care with me, going forward.  Thank you for referring the patient to [place]. Please feel free to contact with any questions.   Nigel Mormon, MD Pager: [phone] Office: [phone]

## 2020-09-02 NOTE — Telephone Encounter (Signed)
Called to discuss with pt. Pt attests to having poor dentition and thus wouldn't be able to follow the chew and park directions needed to be able to effective use the nicorette gum. May benefit from switching to nicorette lozenges. Pt agreeable with switching to lozenges. Rx pended for approval

## 2020-09-02 NOTE — Telephone Encounter (Signed)
Can you send this?

## 2020-09-06 ENCOUNTER — Other Ambulatory Visit: Payer: Self-pay | Admitting: Cardiology

## 2020-09-06 DIAGNOSIS — F17210 Nicotine dependence, cigarettes, uncomplicated: Secondary | ICD-10-CM

## 2020-09-06 MED ORDER — NICORETTE 2 MG MT LOZG: 2.0000 mg | LOZENGE | Freq: Every day | OROMUCOSAL | 1 refills | Status: DC | PRN

## 2020-09-06 NOTE — Addendum Note (Signed)
Addended by: Manuela Schwartz T on: 09/06/2020 04:07 PM   Modules accepted: Orders

## 2020-10-04 ENCOUNTER — Ambulatory Visit: Payer: Medicaid Other | Admitting: Cardiology

## 2020-10-04 NOTE — Progress Notes (Deleted)
Patient referred by [name], [name], MD for CAD  Subjective:   Melissa Garcia, female    DOB: Oct 11, 1960, 60 y.o.   MRN: [id number]   No chief complaint on file.    HPI  60 y/o CAucasian female with CAD s/p prior anterior MI, PCI to LAD/Diag (2010), Cath 2022 showing occluded mid LAD stent, 80% D2 stenosis, h/o ischemic cardiomyopathy with recovered EF 55-60% (04/2019), h/o NSVT (07/2019), tobacco dependence, COPD, mild-mod AI, GERD, h/o COVID pneumonia (09/2019), anxiety, bipolar disorder.  Patient is here to seek second opinion with me. She was most recently seen by Dr. Marina Goodell with [facility]. Prior to that, she was followed by Tennis Must. Berry with [facility].  Patient lives with and takes care of her 31 year old mother with dementia.  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  For several months, she has experienced sharp shooting pains in her left upper chest and left arm, as well as left leg.  Episodes occur for few seconds and resolve on their own.  There is no correlation with physical exertion.  Patient has not experienced severe squeezing retrosternal chest pain similar to what she experienced at the time of her MI in 2010.  Patient is compliant with her cardiac regimen.  She has not been taking her bipolar medications recently.  She continues to smoke, however is down to 1-2 cigarettes/day.   Past Medical History:  Diagnosis Date   Bipolar disorder ([place])    COPD (chronic obstructive pulmonary disease) ([place])    Coronary atherosclerosis of native coronary artery    a. 09/2008 Ant STEMI/VF Arrest/PCI: LM nl, LAD 56m(2.75x23 Xience DES), D1 small, D2 90ost (PTCA), LCX small, min irregs, RI nl, RCA 20-30 diff, EF 30%;  b. 11/2013 Lexi MV: EF 57%, no ischemia.   Eczema    Essential hypertension, benign    Fibroids    History of Ischemic Cardiomyopathy - resolved    a. 09/2008 EF 30% @ time of MI;  b. 05/2012 Echo: EF 55-60%,  no rwma.   History of UTI 10/29/2013   Strep throat    Stress incontinence    Tobacco abuse      Past Surgical History:  Procedure Laterality Date   APPENDECTOMY     CATARACT EXTRACTION W/PHACO Left 01/26/2013   Procedure: LEFT EYE CATARACT EXTRACTION PHACO AND INTRAOCULAR LENS PLACEMENT  CDE=6.68;  Surgeon: KTonny Branch MD;  Location: [facility];  Service: Ophthalmology;  Laterality: Left;   CATARACT EXTRACTION W/PHACO Right 01/08/2013   Procedure: CATARACT EXTRACTION PHACO AND INTRAOCULAR LENS PLACEMENT (IOC);  Surgeon: KTonny Branch MD;  Location: [facility];  Service: Ophthalmology;  Laterality: Right;  CDE 9.04   CORONARY STENT PLACEMENT     heart stent   EYE SURGERY Bilateral 01/2013   cataracts   TUBAL LIGATION       Social History   Tobacco Use  Smoking Status Every Day   Packs/day: 0.50   Years: 30.00   Pack years: 15.00   Types: Cigarettes  Smokeless Tobacco Never    Social History   Substance and Sexual Activity  Alcohol Use No     Family History  Problem Relation Age of Onset   Cancer Father        lung   Cancer Mother        skin   Stroke Mother    Hypertension Mother    Birth defects Sister  born with hole in her heart     Current Outpatient Medications on File Prior to Visit  Medication Sig Dispense Refill   albuterol (VENTOLIN HFA) 108 (90 Base) MCG/ACT inhaler Inhale 1-2 puffs into the lungs every 6 (six) hours as needed for wheezing or shortness of breath. 18 g 0   aspirin EC 81 MG tablet Take 1 tablet (81 mg total) by mouth daily. 90 tablet 3   atorvastatin (LIPITOR) 20 MG tablet Take 1 tablet (20 mg total) by mouth daily. 90 tablet 3   budesonide-formoterol (SYMBICORT) 80-4.5 MCG/ACT inhaler Inhale 2 puffs into the lungs 2 (two) times daily. 1 Inhaler 3   carvedilol (COREG) 6.25 MG tablet Take 1 tablet (6.25 mg total) by mouth 2 (two) times daily with a meal. 180 tablet 2   cholecalciferol (VITAMIN D3) 25 MCG (1000 UNIT) tablet Take 1,000  Units by mouth daily.     Cyanocobalamin (VITAMIN B-12) 5000 MCG SUBL Place under the tongue every 30 (thirty) days.     fluticasone (FLONASE) 50 MCG/ACT nasal spray Place 2 sprays into both nostrils daily.     hydrOXYzine (ATARAX/VISTARIL) 25 MG tablet Take 25 mg by mouth 2 (two) times daily. (Patient not taking: Reported on 08/29/2020)     ibuprofen (ADVIL,MOTRIN) 800 MG tablet Take 800 mg by mouth every 8 (eight) hours as needed for mild pain. (Patient not taking: Reported on 08/29/2020)     LORazepam (ATIVAN) 1 MG tablet SMARTSIG:1 Tablet(s) By Mouth (Patient not taking: Reported on 08/29/2020)     NICORETTE 2 MG lozenge TAKE 1 LOZENGE (2 MG TOTAL) BY MOUTH DAILY AS NEEDED FOR SMOKING CESSATION (USE 1 LOZENGE EVERY 1-2 HRS AS NEEDED FOR MAXIMUM OF 20 LOZENGES/DAY). 135 lozenge 1   nitroGLYCERIN (NITROSTAT) 0.4 MG SL tablet Place 1 tablet (0.4 mg total) under the tongue every 5 (five) minutes as needed for chest pain. 25 tablet 3   OLANZapine (ZYPREXA) 2.5 MG tablet Take 2.5 mg by mouth at bedtime. (Patient not taking: Reported on 08/29/2020)     pantoprazole (PROTONIX) 40 MG tablet Take 40 mg by mouth daily.     No current facility-administered medications on file prior to visit.    Cardiovascular and other pertinent studies:  EKG 08/29/2020: Sinus rhythm 65 bpm Old anteroseptal infarct  Coronary angiography 06/10/2020: LM: No significant disease, LAD: Prox 10-20% stenosis. 100% occlusion at mid LAD insert previously placed stent.           Patent D1           D2 small to medium size vessel, coming off previously stented segment in LAD, with 80% ostial disease.  LCx: Luminal irregularities but no significant obstructive disease. RCA: Diffuse 30 to 40% mid disease.  Nuclear stress test 05/2020: Moderate size anteroseptal and apical defect suggestive of small to moderate ischemia in the distal LAD distribution with possible small apical scar.  Small inferior defect likely secondary to GI  interference and doubtful for tiny scar. GI interference and breast attenuation were noted and could decrease specificity of the study. Normal LV systolic function. Slightly dilated LV.  Echocardiogram 04/19/2019: Left Ventricle: Left ventricle is normal in size. Wall thickness is  normal. Systolic function is normal. EF: 55-60%. No regional wall motion  abnormalities noted. Doppler parameters indicate normal diastolic  function. There is no thrombus.    Aortic Valve: The aortic valve was not well visualized. The leaflets  exhibit normal excursion. There is mild sclerosis. There is mild to  moderate regurgitation with centrally directed jet. There is no evidence  of aortic valve stenosis.    Aorta: The aortic root is normal in size. Ascending aorta normal in  size.  30 day event monitor 07/2019: 12 sec NSVT  Labs:  05/2020: Glucose 94. BUN/Cr >/0.78. GFR 85. Na/K 142/4.4 Chol ?, TG 137, HDL 38, LDL 70 A2C 5.5%    Recent labs: April-July 022: Glucose 92, BUN/Cr 7/0.84. EGFR 79. Na/K 142/4.3. Rest of the CMP normal H/H 13/40. MCV 89. Platelets 228 HbA1C 5.5% Chol 132, TG 137, HDL 38, LDL 70 TSH N/A   Review of Systems  Cardiovascular:  Positive for chest pain. Negative for dyspnea on exertion, leg swelling, palpitations and syncope.  Musculoskeletal:        Sharp shooting pain in her left arm and left leg, lasting for few seconds        There were no vitals filed for this visit.    There is no height or weight on file to calculate BMI. There were no vitals filed for this visit.    Objective:   Physical Exam Vitals and nursing note reviewed.  Constitutional:      General: She is not in acute distress. Neck:     Vascular: No JVD.  Cardiovascular:     Rate and Rhythm: Normal rate and regular rhythm.     Pulses: Normal pulses.     Heart sounds: Murmur heard.  Harsh midsystolic murmur is present with a grade of 1/6 at the upper right sternal border radiating to  the neck.  Pulmonary:     Effort: Pulmonary effort is normal.     Breath sounds: Normal breath sounds. No wheezing or rales.  Musculoskeletal:     Right lower leg: No edema.     Left lower leg: No edema.         Assessment & Recommendations:   60 y/o CAucasian female with CAD s/p prior anterior MI, PCI to LAD/Diag (2010), Cath 2022 showing occluded mid LAD stent, 80% D2 stenosis, h/o ischemic cardiomyopathy with recovered EF 55-60% (04/2019), h/o NSVT (07/2019), tobacco dependence, COPD, mild-mod AI, GERD, h/o COVID pneumonia (09/2019), anxiety, bipolar disorder.  CAD: No clear anginal symptoms at this time. Ischemia in LAD territory and occluded LAD stent on coronary angiogram 06/2020, no indication for intervention in absence of clear angina or heart failure symptoms. Continue aspirin 81 mg, atorvastatin 20 mg daily. Lipids well controlled Increased carvedilol to 6.25 mg bid Will obtain CD of her coronary angiogram (06/2020) from [facility]  Nicotine dependence: Tobacco cessation counseling:  - Currently smoking 1-2 cigarettes/day   - Patient was informed of the dangers of tobacco abuse including stroke, cancer, and MI, as well as benefits of tobacco cessation. - Patient is willing to quit at this time. - Approximately 5 mins were spent counseling patient cessation techniques. We discussed various methods to help quit smoking, including deciding on a date to quit, joining a support group, pharmacological agents. Patient would like to use nicotine gum. - I will reassess her progress at the next follow-up visit   Encouraged her to f/u w/her PCP re: bipolar disorder  While her visit today was to seek second opinion, she states she would establish cardiac care with me, going forward.  Thank you for referring the patient to [place]. Please feel free to contact with any questions.   Nigel Mormon, MD Pager: [phone] Office: [phone]

## 2020-11-07 ENCOUNTER — Ambulatory Visit: Payer: Medicaid Other | Admitting: Cardiology

## 2020-11-07 DIAGNOSIS — F17210 Nicotine dependence, cigarettes, uncomplicated: Secondary | ICD-10-CM | POA: Insufficient documentation

## 2020-11-07 NOTE — Progress Notes (Signed)
Rescheduled

## 2020-11-17 ENCOUNTER — Ambulatory Visit: Payer: Medicaid Other | Admitting: Cardiology

## 2020-11-30 ENCOUNTER — Ambulatory Visit: Payer: Medicaid Other | Admitting: Cardiology

## 2020-12-07 ENCOUNTER — Ambulatory Visit: Payer: Medicaid Other | Admitting: Cardiology

## 2020-12-07 NOTE — Progress Notes (Signed)
Error

## 2020-12-09 ENCOUNTER — Ambulatory Visit: Payer: Medicaid Other | Admitting: Cardiology

## 2020-12-09 ENCOUNTER — Other Ambulatory Visit: Payer: Self-pay

## 2020-12-09 ENCOUNTER — Encounter: Payer: Self-pay | Admitting: Cardiology

## 2020-12-09 VITALS — BP 129/70 | HR 62 | Resp 16 | Ht 63.0 in | Wt 161.4 lb

## 2020-12-09 DIAGNOSIS — F17210 Nicotine dependence, cigarettes, uncomplicated: Secondary | ICD-10-CM

## 2020-12-09 DIAGNOSIS — Z01818 Encounter for other preprocedural examination: Secondary | ICD-10-CM | POA: Insufficient documentation

## 2020-12-09 DIAGNOSIS — I25118 Atherosclerotic heart disease of native coronary artery with other forms of angina pectoris: Secondary | ICD-10-CM

## 2020-12-09 DIAGNOSIS — R0789 Other chest pain: Secondary | ICD-10-CM

## 2020-12-09 NOTE — Progress Notes (Signed)
Patient referred by [name], [name], MD for CAD  Subjective:   Melissa Garcia, female    DOB: 1960/02/06, 60 y.o.   MRN: [id number]   Chief Complaint  Patient presents with   Coronary Artery Disease   Follow-up   surgical clearance      HPI  60 y/o CAucasian female with CAD s/p prior anterior MI, PCI to LAD/Diag (2010), Cath 2022 showing occluded mid LAD stent, 80% D2 stenosis, h/o ischemic cardiomyopathy with recovered EF 55-60% (04/2019), h/o NSVT (07/2019), tobacco dependence, COPD, mild-mod AI, GERD, h/o COVID pneumonia (09/2019), anxiety, bipolar disorder.  Patient is doing well from cardiac standpoint. She does not have any symptoms of angina, similar to what she experienced with her MI. She has left arm tingling sensation lasting for few seconds, unrelated to exertion. She also has separate left upper abdominal pain that only occurs after drinking coffee.   Initial consultation HPI 08/2020: Patient is here to seek second opinion with me. She was most recently seen by Dr. Marina Goodell with [facility]. Prior to that, she was followed by Tennis Must. Berry with [facility].  Patient lives with and takes care of her 12 year old mother with dementia.  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  For several months, she has experienced sharp shooting pains in her left upper chest and left arm, as well as left leg.  Episodes occur for few seconds and resolve on their own.  There is no correlation with physical exertion.  Patient has not experienced severe squeezing retrosternal chest pain similar to what she experienced at the time of her MI in 2010.  Patient is compliant with her cardiac regimen.  She has not been taking her bipolar medications recently.  She continues to smoke, however is down to 1-2 cigarettes/day.   Current Outpatient Medications on File Prior to Visit  Medication Sig Dispense Refill   albuterol (VENTOLIN HFA) 108 (90  Base) MCG/ACT inhaler Inhale 1-2 puffs into the lungs every 6 (six) hours as needed for wheezing or shortness of breath. 18 g 0   aspirin EC 81 MG tablet Take 1 tablet (81 mg total) by mouth daily. 90 tablet 3   atorvastatin (LIPITOR) 20 MG tablet Take 1 tablet (20 mg total) by mouth daily. 90 tablet 3   budesonide-formoterol (SYMBICORT) 80-4.5 MCG/ACT inhaler Inhale 2 puffs into the lungs 2 (two) times daily. 1 Inhaler 3   carvedilol (COREG) 6.25 MG tablet Take 1 tablet (6.25 mg total) by mouth 2 (two) times daily with a meal. 180 tablet 2   cholecalciferol (VITAMIN D3) 25 MCG (1000 UNIT) tablet Take 1,000 Units by mouth daily.     Cyanocobalamin (VITAMIN B-12) 5000 MCG SUBL Place under the tongue every 30 (thirty) days.     fluticasone (FLONASE) 50 MCG/ACT nasal spray Place 2 sprays into both nostrils daily.     hydrOXYzine (ATARAX/VISTARIL) 25 MG tablet Take 25 mg by mouth 2 (two) times daily. (Patient not taking: Reported on 08/29/2020)     ibuprofen (ADVIL,MOTRIN) 800 MG tablet Take 800 mg by mouth every 8 (eight) hours as needed for mild pain. (Patient not taking: Reported on 08/29/2020)     LORazepam (ATIVAN) 1 MG tablet SMARTSIG:1 Tablet(s) By Mouth (Patient not taking: Reported on 08/29/2020)     NICORETTE 2 MG lozenge TAKE 1 LOZENGE (2 MG TOTAL) BY MOUTH DAILY AS NEEDED FOR SMOKING CESSATION (USE 1 LOZENGE EVERY 1-2 HRS  AS NEEDED FOR MAXIMUM OF 20 LOZENGES/DAY). 135 lozenge 1   nitroGLYCERIN (NITROSTAT) 0.4 MG SL tablet Place 1 tablet (0.4 mg total) under the tongue every 5 (five) minutes as needed for chest pain. 25 tablet 3   OLANZapine (ZYPREXA) 2.5 MG tablet Take 2.5 mg by mouth at bedtime. (Patient not taking: Reported on 08/29/2020)     pantoprazole (PROTONIX) 40 MG tablet Take 40 mg by mouth daily.     No current facility-administered medications on file prior to visit.    Cardiovascular and other pertinent studies:  EKG 08/29/2020: Sinus rhythm 65 bpm Old anteroseptal  infarct  Coronary angiography 06/10/2020: LM: No significant disease, LAD: Prox 10-20% stenosis. 100% occlusion at mid LAD insert previously placed stent.           Patent D1           D2 small to medium size vessel, coming off previously stented segment in LAD, with 80% ostial disease.  LCx: Luminal irregularities but no significant obstructive disease. RCA: Diffuse 30 to 40% mid disease.  Nuclear stress test 05/2020: Moderate size anteroseptal and apical defect suggestive of small to moderate ischemia in the distal LAD distribution with possible small apical scar.  Small inferior defect likely secondary to GI interference and doubtful for tiny scar. GI interference and breast attenuation were noted and could decrease specificity of the study. Normal LV systolic function. Slightly dilated LV.  Echocardiogram 04/19/2019: Left Ventricle: Left ventricle is normal in size. Wall thickness is  normal. Systolic function is normal. EF: 55-60%. No regional wall motion  abnormalities noted. Doppler parameters indicate normal diastolic  function. There is no thrombus.    Aortic Valve: The aortic valve was not well visualized. The leaflets  exhibit normal excursion. There is mild sclerosis. There is mild to  moderate regurgitation with centrally directed jet. There is no evidence  of aortic valve stenosis.    Aorta: The aortic root is normal in size. Ascending aorta normal in  size.  30 day event monitor 07/2019: 12 sec NSVT  Labs:  05/2020: Glucose 94. BUN/Cr >/0.78. GFR 85. Na/K 142/4.4 Chol ?, TG 137, HDL 38, LDL 70 A2C 5.5%    Recent labs: April-July 022: Glucose 92, BUN/Cr 7/0.84. EGFR 79. Na/K 142/4.3. Rest of the CMP normal H/H 13/40. MCV 89. Platelets 228 HbA1C 5.5% Chol 132, TG 137, HDL 38, LDL 70 TSH N/A   Review of Systems  Cardiovascular:  Positive for chest pain (As per HPI). Negative for dyspnea on exertion, leg swelling, palpitations and syncope.  Musculoskeletal:         Sharp shooting pain in her left arm and left leg, lasting for few seconds        Vitals:   12/09/20 1035  BP: 129/70  Pulse: 62  Resp: 16  SpO2: 97%     Body mass index is 28.59 kg/m. Filed Weights   12/09/20 1035  Weight: 161 lb 6.4 oz (73.2 kg)     Objective:   Physical Exam Vitals and nursing note reviewed.  Constitutional:      General: She is not in acute distress. Neck:     Vascular: No JVD.  Cardiovascular:     Rate and Rhythm: Normal rate and regular rhythm.     Pulses: Normal pulses.     Heart sounds: Murmur heard.  Harsh midsystolic murmur is present with a grade of 1/6 at the upper right sternal border radiating to the neck.  Pulmonary:  Effort: Pulmonary effort is normal.     Breath sounds: Normal breath sounds. No wheezing or rales.  Musculoskeletal:     Right lower leg: No edema.     Left lower leg: No edema.         Assessment & Recommendations:   60 y/o CAucasian female with CAD s/p prior anterior MI, PCI to LAD/Diag (2010), Cath 2022 showing occluded mid LAD stent, 80% D2 stenosis, h/o ischemic cardiomyopathy with recovered EF 55-60% (04/2019), h/o NSVT (07/2019), tobacco dependence, COPD, mild-mod AI, GERD, h/o COVID pneumonia (09/2019), anxiety, bipolar disorder.  CAD: Ischemia in LAD territory and occluded LAD stent on coronary angiogram 06/2020, but no clear anginal symptoms at this time. No indication for intervention in absence of clear angina or heart failure symptoms. Continue aspirin 81 mg, atorvastatin 20 mg daily, carvedilol to 6.25 mg bid Lipids well controlled.  Pre-op risk stratification: CAD with likely mid LAD CTO. Overall, peri-operative risk for upcoming hysteroscopy with polypectomy is elevated, but not prohibitive.   Nicotine dependence: Encourage tobacco cessation  F/u in 6 months   Nigel Mormon, MD Pager: [phone] Office: [phone]

## 2021-06-06 ENCOUNTER — Ambulatory Visit: Payer: Medicaid Other | Admitting: Cardiology

## 2021-06-08 ENCOUNTER — Ambulatory Visit: Payer: Medicaid Other | Admitting: Cardiology

## 2021-06-22 ENCOUNTER — Ambulatory Visit: Payer: Medicaid Other | Admitting: Cardiology

## 2021-06-30 ENCOUNTER — Other Ambulatory Visit: Payer: Self-pay | Admitting: Cardiology

## 2021-06-30 DIAGNOSIS — I25118 Atherosclerotic heart disease of native coronary artery with other forms of angina pectoris: Secondary | ICD-10-CM

## 2021-07-10 ENCOUNTER — Encounter: Payer: Self-pay | Admitting: Cardiology

## 2021-07-10 ENCOUNTER — Ambulatory Visit: Payer: Medicaid Other | Admitting: Cardiology

## 2021-07-10 VITALS — BP 116/69 | HR 66 | Temp 98.0°F | Resp 16 | Ht 63.0 in | Wt 149.0 lb

## 2021-07-10 DIAGNOSIS — F17210 Nicotine dependence, cigarettes, uncomplicated: Secondary | ICD-10-CM

## 2021-07-10 DIAGNOSIS — I25118 Atherosclerotic heart disease of native coronary artery with other forms of angina pectoris: Secondary | ICD-10-CM

## 2021-07-10 NOTE — Progress Notes (Unsigned)
Patient referred by Melissa Garcia, Melissa T, MD for CAD  Subjective:   Melissa Garcia, female    DOB: [birth year], 61 y.o.   MRN: [id number]   No chief complaint on file.    HPI  61 y/o CAucasian female with CAD s/p prior anterior MI, PCI to LAD/Diag (2010), Cath 2022 showing occluded mid LAD stent, 80% D2 stenosis, h/o ischemic cardiomyopathy with recovered EF 55-60% (04/2019), h/o NSVT (07/2019), tobacco dependence, COPD, mild-mod AI, GERD, h/o COVID pneumonia (09/2019), anxiety, bipolar disorder.  ***Patient is doing well from cardiac standpoint. She does not have any symptoms of angina, similar to what she experienced with her MI. She has left arm tingling sensation lasting for few seconds, unrelated to exertion. She also has separate left upper abdominal pain that only occurs after drinking coffee.   Initial consultation HPI 08/2020: Patient is here to seek second opinion with me. She was most recently seen by Dr. Marina Goodell with [facility]. Prior to that, she was followed by Tennis Must. Berry with [facility].  Patient lives with and takes care of her 63 year old mother with dementia.  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  For several months, she has experienced sharp shooting pains in her left upper chest and left arm, as well as left leg.  Episodes occur for few seconds and resolve on their own.  There is no correlation with physical exertion.  Patient has not experienced severe squeezing retrosternal chest pain similar to what she experienced at the time of her MI in 2010.  Patient is compliant with her cardiac regimen.  She has not been taking her bipolar medications recently.  She continues to smoke, however is down to 1-2 cigarettes/day.    Current Outpatient Medications:    albuterol (VENTOLIN HFA) 108 (90 Base) MCG/ACT inhaler, Inhale 1-2 puffs into the lungs every 6 (six) hours as needed for wheezing or shortness of breath., Disp:  18 g, Rfl: 0   aspirin EC 81 MG tablet, Take 1 tablet (81 mg total) by mouth daily., Disp: 90 tablet, Rfl: 3   atorvastatin (LIPITOR) 20 MG tablet, Take 1 tablet (20 mg total) by mouth daily., Disp: 90 tablet, Rfl: 3   carvedilol (COREG) 6.25 MG tablet, TAKE 1 TABLET BY MOUTH 2 TIMES DAILY WITH A MEAL., Disp: 180 tablet, Rfl: 2   cholecalciferol (VITAMIN D3) 25 MCG (1000 UNIT) tablet, Take 1,000 Units by mouth daily., Disp: , Rfl:    Cyanocobalamin (VITAMIN B-12) 5000 MCG SUBL, Place under the tongue every 30 (thirty) days., Disp: , Rfl:    fluticasone (FLONASE) 50 MCG/ACT nasal spray, Place 2 sprays into both nostrils daily., Disp: , Rfl:    nitroGLYCERIN (NITROSTAT) 0.4 MG SL tablet, Place 1 tablet (0.4 mg total) under the tongue every 5 (five) minutes as needed for chest pain., Disp: 25 tablet, Rfl: 3   pantoprazole (PROTONIX) 40 MG tablet, Take 40 mg by mouth daily., Disp: , Rfl:   Cardiovascular and other pertinent studies:  EKG 08/29/2020: Sinus rhythm 65 bpm Old anteroseptal infarct  Coronary angiography 06/10/2020: LM: No significant disease, LAD: Prox 10-20% stenosis. 100% occlusion at mid LAD insert previously placed stent.           Patent D1           D2 small to medium size vessel, coming off previously stented segment in LAD, with 80% ostial disease.  LCx: Luminal irregularities but no significant  obstructive disease. RCA: Diffuse 30 to 40% mid disease.  Nuclear stress test 05/2020: Moderate size anteroseptal and apical defect suggestive of small to moderate ischemia in the distal LAD distribution with possible small apical scar.  Small inferior defect likely secondary to GI interference and doubtful for tiny scar. GI interference and breast attenuation were noted and could decrease specificity of the study. Normal LV systolic function. Slightly dilated LV.  Echocardiogram 04/19/2019: Left Ventricle: Left ventricle is normal in size. Wall thickness is  normal. Systolic  function is normal. EF: 55-60%. No regional wall motion  abnormalities noted. Doppler parameters indicate normal diastolic  function. There is no thrombus.    Aortic Valve: The aortic valve was not well visualized. The leaflets  exhibit normal excursion. There is mild sclerosis. There is mild to  moderate regurgitation with centrally directed jet. There is no evidence  of aortic valve stenosis.    Aorta: The aortic root is normal in size. Ascending aorta normal in  size.  30 day event monitor 07/2019: 12 sec NSVT  Labs:  05/2020: Glucose 94. BUN/Cr >/0.78. GFR 85. Na/K 142/4.4 Chol ?, TG 137, HDL 38, LDL 70 A2C 5.5%    Recent labs: 05/02/2021: Glucose 92, BUN/Cr 7/0.66. EGFR 99. Na/K 146/4.5. Rest of the CMP normal H/H 13/38. MCV 88. Platelets 207 Chol 129, TG 215, HDL 30, LDL 64  April-July 2022: Glucose 92, BUN/Cr 7/0.84. EGFR 79. Na/K 142/4.3. Rest of the CMP normal H/H 13/40. MCV 89. Platelets 228 HbA1C 5.5% Chol 132, TG 137, HDL 38, LDL 70 TSH N/A   Review of Systems  Cardiovascular:  Positive for chest pain (As per HPI). Negative for dyspnea on exertion, leg swelling, palpitations and syncope.  Musculoskeletal:        Sharp shooting pain in her left arm and left leg, lasting for few seconds        There were no vitals filed for this visit.    There is no height or weight on file to calculate BMI. There were no vitals filed for this visit.    Objective:   Physical Exam Vitals and nursing note reviewed.  Constitutional:      General: She is not in acute distress. Neck:     Vascular: No JVD.  Cardiovascular:     Rate and Rhythm: Normal rate and regular rhythm.     Pulses: Normal pulses.     Heart sounds: Murmur heard.  Harsh midsystolic murmur is present with a grade of 1/6 at the upper right sternal border radiating to the neck.  Pulmonary:     Effort: Pulmonary effort is normal.     Breath sounds: Normal breath sounds. No wheezing or rales.   Musculoskeletal:     Right lower leg: No edema.     Left lower leg: No edema.         Assessment & Recommendations:   61 y/o CAucasian female with CAD s/p prior anterior MI, PCI to LAD/Diag (2010), Cath 2022 showing occluded mid LAD stent, 80% D2 stenosis, h/o ischemic cardiomyopathy with recovered EF 55-60% (04/2019), h/o NSVT (07/2019), tobacco dependence, COPD, mild-mod AI, GERD, h/o COVID pneumonia (09/2019), anxiety, bipolar disorder.  ***CAD: Ischemia in LAD territory and occluded LAD stent on coronary angiogram 06/2020, but no clear anginal symptoms at this time. No indication for intervention in absence of clear angina or heart failure symptoms. Continue aspirin 81 mg, atorvastatin 20 mg daily, carvedilol to 6.25 mg bid Lipids well controlled.  *** Nicotine dependence:  Encourage tobacco cessation  ***F/u in 6 months   Nigel Mormon, MD Pager: [phone] Office: [phone]

## 2021-07-11 ENCOUNTER — Telehealth: Payer: Self-pay | Admitting: Cardiology

## 2021-07-11 NOTE — Telephone Encounter (Signed)
Left voicemail to schedule 6 month f/u (was here when power outage occurred).

## 2022-01-10 ENCOUNTER — Ambulatory Visit: Payer: Medicaid Other | Admitting: Cardiology

## 2022-01-12 ENCOUNTER — Ambulatory Visit: Payer: Medicaid Other | Admitting: Cardiology

## 2022-01-15 ENCOUNTER — Encounter: Payer: Self-pay | Admitting: Cardiology

## 2022-01-15 ENCOUNTER — Ambulatory Visit: Payer: Medicaid Other | Admitting: Cardiology

## 2022-01-15 VITALS — BP 128/68 | HR 60 | Ht 63.0 in | Wt 147.2 lb

## 2022-01-15 DIAGNOSIS — I25118 Atherosclerotic heart disease of native coronary artery with other forms of angina pectoris: Secondary | ICD-10-CM

## 2022-01-15 NOTE — Progress Notes (Signed)
Patient referred by [name], [name], MD for CAD  Subjective:   Melissa Garcia, female    DOB: 02-Apr-1960, 61 y.o.   MRN: [id number]   Chief Complaint  Patient presents with   Coronary Artery Disease   Follow-up   Chest Pain     HPI  61 y/o CAucasian female with CAD s/p prior anterior MI, PCI to LAD/Diag (2010), Cath 2022 showing occluded mid LAD stent, 80% D2 stenosis, h/o ischemic cardiomyopathy with recovered EF 55-60% (04/2019), h/o NSVT (07/2019), tobacco dependence, COPD, mild-mod AI, GERD, h/o COVID pneumonia (09/2019), anxiety, bipolar disorder.  Patient continues to have episodes of sharp shooting pain from left chest/back/left arm, lasting only for few seconds.  She has been seeing neurology at [facility] for the same complaint.  She does not have any exertional chest pain or shortness of breath complaints.  Blood pressure is well-controlled.  Ambulatory EEG 10/13/2021: Clinical correlation:  The findings of the study indicate: -  A lower seizure threshold is seen in the temporal regions, bilaterally. -  The event of hemibody paresthesias did not correlate with any epileptiform discharges, but this study cannot exclude the possibility of focal seizure without EEG correlate.   NCV/EMG 10/11/2021: This is a normal electrodiagnostic study. There is no evidence for peripheral neuropathy, cervical radiculopathy, or lumbar radiculopathy.    Initial consultation HPI 08/2020: Patient is here to seek second opinion with me. She was most recently seen by Dr. Marina Goodell with [facility]. Prior to that, she was followed by Tennis Must. Berry with [facility].  Patient lives with and takes care of her 66 year old mother with dementia.  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  For several months, she has experienced sharp shooting pains in her left upper chest and left arm, as well as left leg.  Episodes occur for few seconds and resolve on their  own.  There is no correlation with physical exertion.  Patient has not experienced severe squeezing retrosternal chest pain similar to what she experienced at the time of her MI in 2010.  Patient is compliant with her cardiac regimen.  She has not been taking her bipolar medications recently.  She continues to smoke, however is down to 1-2 cigarettes/day.    Current Outpatient Medications:    albuterol (VENTOLIN HFA) 108 (90 Base) MCG/ACT inhaler, Inhale 1-2 puffs into the lungs every 6 (six) hours as needed for wheezing or shortness of breath., Disp: 18 g, Rfl: 0   aspirin EC 81 MG tablet, Take 1 tablet (81 mg total) by mouth daily., Disp: 90 tablet, Rfl: 3   atorvastatin (LIPITOR) 20 MG tablet, Take 1 tablet (20 mg total) by mouth daily., Disp: 90 tablet, Rfl: 3   carvedilol (COREG) 6.25 MG tablet, TAKE 1 TABLET BY MOUTH 2 TIMES DAILY WITH A MEAL., Disp: 180 tablet, Rfl: 2   cholecalciferol (VITAMIN D3) 25 MCG (1000 UNIT) tablet, Take 1,000 Units by mouth daily., Disp: , Rfl:    Cyanocobalamin (VITAMIN B-12) 5000 MCG SUBL, Place under the tongue every 30 (thirty) days., Disp: , Rfl:    fluticasone (FLONASE) 50 MCG/ACT nasal spray, Place 2 sprays into both nostrils daily., Disp: , Rfl:    nitroGLYCERIN (NITROSTAT) 0.4 MG SL tablet, Place 1 tablet (0.4 mg total) under the tongue every 5 (five) minutes as needed for chest pain., Disp: 25 tablet, Rfl: 3   pantoprazole (PROTONIX) 40 MG tablet, Take 40 mg by  mouth daily., Disp: , Rfl:   Cardiovascular and other pertinent studies:  EKG 01/15/2022: Sinus rhythm 61 bpm  Normal EKG  Coronary angiography 06/10/2020: LM: No significant disease, LAD: Prox 10-20% stenosis. 100% occlusion at mid LAD insert previously placed stent.           Patent D1           D2 small to medium size vessel, coming off previously stented segment in LAD, with 80% ostial disease.  LCx: Luminal irregularities but no significant obstructive disease. RCA: Diffuse 30 to  40% mid disease.  Nuclear stress test 05/2020: Moderate size anteroseptal and apical defect suggestive of small to moderate ischemia in the distal LAD distribution with possible small apical scar.  Small inferior defect likely secondary to GI interference and doubtful for tiny scar. GI interference and breast attenuation were noted and could decrease specificity of the study. Normal LV systolic function. Slightly dilated LV.  Echocardiogram 04/19/2019: Left Ventricle: Left ventricle is normal in size. Wall thickness is  normal. Systolic function is normal. EF: 55-60%. No regional wall motion  abnormalities noted. Doppler parameters indicate normal diastolic  function. There is no thrombus.    Aortic Valve: The aortic valve was not well visualized. The leaflets  exhibit normal excursion. There is mild sclerosis. There is mild to  moderate regurgitation with centrally directed jet. There is no evidence  of aortic valve stenosis.    Aorta: The aortic root is normal in size. Ascending aorta normal in  size.  30 day event monitor 07/2019: 12 sec NSVT  Labs:  05/2020: Glucose 94. BUN/Cr >/0.78. GFR 85. Na/K 142/4.4 Chol ?, TG 137, HDL 38, LDL 70 A2C 5.5%    Recent labs: 05/02/2021: Glucose 92, BUN/Cr 7/0.66. EGFR 99. Na/K 146/4.5. Rest of the CMP normal H/H 13/38. MCV 88. Platelets 207 Chol 129, TG 215, HDL 30, LDL 64  April-July 2022: Glucose 92, BUN/Cr 7/0.84. EGFR 79. Na/K 142/4.3. Rest of the CMP normal H/H 13/40. MCV 89. Platelets 228 HbA1C 5.5% Chol 132, TG 137, HDL 38, LDL 70 TSH N/A   Review of Systems  Cardiovascular:  Positive for chest pain (As per HPI). Negative for dyspnea on exertion, leg swelling, palpitations and syncope.  Musculoskeletal:        Sharp shooting pain in her left arm and left leg, lasting for few seconds         Vitals:   01/15/22 0954  BP: 128/68  Pulse: 60  SpO2: 97%     Body mass index is 26.08 kg/m. Filed Weights   01/15/22 0954   Weight: 147 lb 3.2 oz (66.8 kg)     Objective:   Physical Exam Vitals and nursing note reviewed.  Constitutional:      General: She is not in acute distress. Neck:     Vascular: No JVD.  Cardiovascular:     Rate and Rhythm: Normal rate and regular rhythm.     Pulses: Normal pulses.     Heart sounds: Murmur heard.     Harsh midsystolic murmur is present with a grade of 1/6 at the upper right sternal border radiating to the neck.  Pulmonary:     Effort: Pulmonary effort is normal.     Breath sounds: Normal breath sounds. No wheezing or rales.  Musculoskeletal:     Right lower leg: No edema.     Left lower leg: No edema.          Assessment & Recommendations:  61 y/o CAucasian female with CAD s/p prior anterior MI, PCI to LAD/Diag (2010), Cath 2022 showing occluded mid LAD stent, 80% D2 stenosis, h/o ischemic cardiomyopathy with recovered EF 55-60% (04/2019), h/o NSVT (07/2019), tobacco dependence, COPD, mild-mod AI, GERD, h/o COVID pneumonia (09/2019), anxiety, bipolar disorder.  CAD: Ischemia in LAD territory and occluded LAD stent on coronary angiogram 06/2020, but no clear anginal symptoms at this time.  Current symptoms of sharp shooting pains lasting for seconds are not related to angina. Will obtain an echocardiogram. As long as EF is preserved, do not recommend any intervention at this time.  If EF is reduced, or has clear anginal symptoms, we will then repeat coronary angiography and consider revascularization for chronically occluded LAD stent. Continue aspirin 81 mg, atorvastatin 20 mg daily, carvedilol to 6.25 mg bid Lipids well controlled.  F/u in 6 months   Nigel Mormon, MD Pager: [phone] Office: [phone]

## 2022-01-22 ENCOUNTER — Other Ambulatory Visit: Payer: Medicaid Other

## 2022-01-31 ENCOUNTER — Ambulatory Visit: Payer: Medicaid Other

## 2022-01-31 DIAGNOSIS — I25118 Atherosclerotic heart disease of native coronary artery with other forms of angina pectoris: Secondary | ICD-10-CM

## 2022-03-29 ENCOUNTER — Other Ambulatory Visit: Payer: Self-pay | Admitting: Cardiology

## 2022-03-29 DIAGNOSIS — I25118 Atherosclerotic heart disease of native coronary artery with other forms of angina pectoris: Secondary | ICD-10-CM

## 2022-05-29 ENCOUNTER — Telehealth: Payer: Self-pay

## 2022-05-29 NOTE — Telephone Encounter (Signed)
Patient is wanting [place] to refill her Atorvastatin. It has not been filled since 2022, but her PCP says her Cholesterol is high and she needs to restart this medication.

## 2022-05-29 NOTE — Telephone Encounter (Signed)
I do not have any recent labs (please get from PCP if possible), but she absolutely needs to be on statin. She has CAD. Please send Lipitor 40 mg (not 20 mg) daily, 90 pillsX3 refills.  Thanks MJP

## 2022-05-30 ENCOUNTER — Other Ambulatory Visit: Payer: Self-pay

## 2022-05-30 MED ORDER — ATORVASTATIN CALCIUM 40 MG PO TABS: 40.0000 mg | ORAL_TABLET | Freq: Every day | ORAL | 3 refills | Status: AC

## 2022-05-30 NOTE — Telephone Encounter (Signed)
Patient medication has been sent.  

## 2022-07-19 ENCOUNTER — Ambulatory Visit: Payer: Medicaid Other | Admitting: Cardiology

## 2022-08-10 ENCOUNTER — Ambulatory Visit: Payer: Medicaid Other | Admitting: Cardiology

## 2022-08-10 ENCOUNTER — Other Ambulatory Visit: Payer: Medicaid Other

## 2022-08-10 ENCOUNTER — Encounter: Payer: Self-pay | Admitting: Cardiology

## 2022-08-10 VITALS — BP 134/70 | HR 60 | Ht 63.0 in | Wt 145.0 lb

## 2022-08-10 DIAGNOSIS — I25118 Atherosclerotic heart disease of native coronary artery with other forms of angina pectoris: Secondary | ICD-10-CM

## 2022-08-10 DIAGNOSIS — R002 Palpitations: Secondary | ICD-10-CM

## 2022-08-10 DIAGNOSIS — I351 Nonrheumatic aortic (valve) insufficiency: Secondary | ICD-10-CM | POA: Insufficient documentation

## 2022-08-10 DIAGNOSIS — F17210 Nicotine dependence, cigarettes, uncomplicated: Secondary | ICD-10-CM

## 2022-08-10 MED ORDER — NICOTINE 14 MG/24HR TD PT24: 14.0000 mg | MEDICATED_PATCH | Freq: Every day | TRANSDERMAL | 0 refills | Status: AC

## 2022-08-10 MED ORDER — AMLODIPINE BESYLATE 5 MG PO TABS: 5.0000 mg | ORAL_TABLET | Freq: Every day | ORAL | 3 refills | Status: AC

## 2022-08-10 NOTE — Progress Notes (Signed)
Patient referred by [place], [name], MD for CAD  Subjective:   Melissa Garcia, female    DOB: 06/05/1960, 62 y.o.   MRN: [id number]   Chief Complaint  Patient presents with   Coronary Artery Disease   Follow-up   Palpitations     HPI  62 y/o CAucasian female with CAD s/p prior anterior MI, PCI to LAD/Diag (2010), Cath 2022 showing occluded mid LAD stent, 80% D2 stenosis, h/o ischemic cardiomyopathy with recovered EF 55-60% (04/2019), h/o NSVT (07/2019), tobacco dependence, COPD, mild-mod AI, GERD, h/o COVID pneumonia (09/2019), anxiety, bipolar disorder.  Patient has recently had episodes of palpitations lasting for a few min at once. She reportedly went to ER, records not available to me. Reportedly, no abnormality was found.   Reviewed recent test results with the patient, details below.    Ambulatory EEG 10/13/2021: Clinical correlation:  The findings of the study indicate: -  A lower seizure threshold is seen in the temporal regions, bilaterally. -  The event of hemibody paresthesias did not correlate with any epileptiform discharges, but this study cannot exclude the possibility of focal seizure without EEG correlate.   NCV/EMG 10/11/2021: This is a normal electrodiagnostic study. There is no evidence for peripheral neuropathy, cervical radiculopathy, or lumbar radiculopathy.    Initial consultation HPI 08/2020: Patient is here to seek second opinion with me. She was most recently seen by Dr. Wille Glaser with [facility]. Prior to that, she was followed by Tommi Rumps. Berry with [facility].  Patient lives with and takes care of her 68 year old mother with dementia.  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  For several months, she has experienced sharp shooting pains in her left upper chest and left arm, as well as left leg.  Episodes occur for few seconds and resolve on their own.  There is no correlation with physical exertion.   Patient has not experienced severe squeezing retrosternal chest pain similar to what she experienced at the time of her MI in 2010.  Patient is compliant with her cardiac regimen.  She has not been taking her bipolar medications recently.  She continues to smoke, however is down to 1-2 cigarettes/day.    Current Outpatient Medications:    albuterol (VENTOLIN HFA) 108 (90 Base) MCG/ACT inhaler, Inhale 1-2 puffs into the lungs every 6 (six) hours as needed for wheezing or shortness of breath., Disp: 18 g, Rfl: 0   aspirin EC 81 MG tablet, Take 1 tablet (81 mg total) by mouth daily., Disp: 90 tablet, Rfl: 3   atorvastatin (LIPITOR) 40 MG tablet, Take 1 tablet (40 mg total) by mouth daily., Disp: 90 tablet, Rfl: 3   carvedilol (COREG) 6.25 MG tablet, TAKE 1 TABLET BY MOUTH TWICE A DAY WITH MEALS, Disp: 180 tablet, Rfl: 2   cholecalciferol (VITAMIN D3) 25 MCG (1000 UNIT) tablet, Take 1,000 Units by mouth daily., Disp: , Rfl:    Cyanocobalamin (VITAMIN B-12) 5000 MCG SUBL, Place under the tongue every 30 (thirty) days., Disp: , Rfl:    fluticasone (FLONASE) 50 MCG/ACT nasal spray, Place 2 sprays into both nostrils daily., Disp: , Rfl:    nitroGLYCERIN (NITROSTAT) 0.4 MG SL tablet, Place 1 tablet (0.4 mg total) under the tongue every 5 (five) minutes as needed for chest pain., Disp: 25 tablet, Rfl: 3  Cardiovascular and other pertinent studies:  EKG 08/10/2022: Sinus rhythm 58 bpm Nonspecific T wave abnormality  Echocardiogram 01/31/2022:  Normal LV systolic function with visual EF 55-60%. Left ventricle cavity is normal in size. Normal left ventricular wall thickness. Normal global wall motion. Doppler evidence of grade I (impaired) diastolic dysfunction, normal LAP.  Moderate to severe aortic regurgitation. Sclerosis of the aortic valve. Mildly restricted aortic valve leaflets. Structurally normal mitral valve.  Mild (Grade I) mitral regurgitation. Structurally normal tricuspid valve with  trace regurgitation. No evidence of pulmonary hypertension. No prior available for comparison.  Coronary angiography 06/10/2020: LM: No significant disease, LAD: Prox 10-20% stenosis. 100% occlusion at mid LAD insert previously placed stent.           Patent D1           D2 small to medium size vessel, coming off previously stented segment in LAD, with 80% ostial disease.  LCx: Luminal irregularities but no significant obstructive disease. RCA: Diffuse 30 to 40% mid disease.  Nuclear stress test 05/2020: Moderate size anteroseptal and apical defect suggestive of small to moderate ischemia in the distal LAD distribution with possible small apical scar.  Small inferior defect likely secondary to GI interference and doubtful for tiny scar. GI interference and breast attenuation were noted and could decrease specificity of the study. Normal LV systolic function. Slightly dilated LV.  30 day event monitor 07/2019: 12 sec NSVT   Recent labs: 05/02/2021: Glucose 92, BUN/Cr 7/0.66. EGFR 99. Na/K 146/4.5. Rest of the CMP normal H/H 13/38. MCV 88. Platelets 207 Chol 129, TG 215, HDL 30, LDL 64  April-July 2022: Glucose 92, BUN/Cr 7/0.84. EGFR 79. Na/K 142/4.3. Rest of the CMP normal H/H 13/40. MCV 89. Platelets 228 HbA1C 5.5% Chol 132, TG 137, HDL 38, LDL 70 TSH N/A   Review of Systems  Cardiovascular:  Positive for chest pain (As per HPI). Negative for dyspnea on exertion, leg swelling, palpitations and syncope.  Musculoskeletal:        Sharp shooting pain in her left arm and left leg, lasting for few seconds         Vitals:   08/10/22 1221  BP: 134/70  Pulse: 60  SpO2: 98%     Body mass index is 25.69 kg/m. Filed Weights   08/10/22 1221  Weight: 145 lb (65.8 kg)     Objective:   Physical Exam Vitals and nursing note reviewed.  Constitutional:      General: She is not in acute distress. Neck:     Vascular: No JVD.  Cardiovascular:     Rate and Rhythm: Normal rate  and regular rhythm.     Pulses: Normal pulses.     Heart sounds: Murmur heard.     Harsh midsystolic murmur is present with a grade of 1/6 at the upper right sternal border radiating to the neck.  Pulmonary:     Effort: Pulmonary effort is normal.     Breath sounds: Normal breath sounds. No wheezing or rales.  Musculoskeletal:     Right lower leg: No edema.     Left lower leg: No edema.          Assessment & Recommendations:   62 y/o CAucasian female with CAD s/p prior anterior MI, PCI to LAD/Diag (2010), Cath 2022 showing occluded mid LAD stent, 80% D2 stenosis, h/o ischemic cardiomyopathy with recovered EF 55-60% (04/2019), h/o NSVT (07/2019), tobacco dependence, COPD, mild-mod AI, GERD, h/o COVID pneumonia (09/2019), anxiety, bipolar disorder.  Palpitations: Check 2 week cardiac telemetry.  CAD: Ischemia in LAD territory and occluded LAD stent on coronary angiogram 06/2020,  but no clear anginal symptoms at this time. EF remains normal.  No indication for revascularization at this time. Continue aspirin 81 mg, atorvastatin 20 mg daily, carvedilol to 6.25 mg bid Lipids well controlled.  Aortic regurgitation: Moderate to severe: I will stop beta blocker and change to amlodipine 5 mg daily. Repeat echocardiogram in 01/2023 (Will order at next visit).  Hypertension: Controlled, but will recheck in 4 weeks given change above.  Nicotine dependence: Tobacco cessation counseling:  - Currently smoking 1-2 packs/day   - Patient was informed of the dangers of tobacco abuse including stroke, cancer, and MI, as well as benefits of tobacco cessation. - Patient is willing to quit at this time. - Approximately 5 mins were spent counseling patient cessation techniques. We discussed various methods to help quit smoking, including deciding on a date to quit, joining a support group, pharmacological agents. Patient would like to use nicotine patch. - I will reassess her progress at the next  follow-up visit   F/u in 6 weeks    Elder Negus, MD Pager: [phone] Office: [phone]

## 2022-09-14 ENCOUNTER — Telehealth: Payer: Self-pay

## 2022-09-14 NOTE — Telephone Encounter (Signed)
Patient aware.

## 2022-09-14 NOTE — Telephone Encounter (Signed)
Stop for a week, see if symptoms improve. I will reassess on 8/16 when I see her.  Thanks MJP

## 2022-09-14 NOTE — Telephone Encounter (Signed)
Pt states ever since she has been on amLODipine (NORVASC) 5 MG tablet she has been nauseous and her ears has been clogged with fluid.she wants to know if can she stop the medication.

## 2022-09-21 ENCOUNTER — Ambulatory Visit: Payer: Medicaid Other | Admitting: Cardiology

## 2022-11-01 ENCOUNTER — Ambulatory Visit: Payer: Medicaid Other | Admitting: Cardiology

## 2022-11-12 NOTE — Progress Notes (Deleted)
Cardiology Office Note:  .   Date:  11/12/2022  ID:  Melissa Garcia, DOB 24-Jan-1961, MRN [id number] PCP: [place], Melissa T, MD  [facility] Providers Cardiologist:  None {   History of Present Illness: .   Melissa Garcia is a 62 y.o. female with a past medical history of CAD status post prior MI, PCI to LAD/diagonal (2010), 2022 showing occluded mid LAD stent, 80% D2 stenosis, history of ischemic cardiopathy with recovering EF 55 to 60% (04/2019), history of NSVT (07/2019), tobacco dependence, COPD, mild to moderate AI, GERD, history of COVID pneumonia (/2021), anxiety, and bipolar disease here for follow-up appointment.  She was seen by Dr. Melvenia Needles 08/10/2022 and had recent episodes of palpitations lasting a few minutes at once.  Reportedly went to the ED and records were unfortunately not available.  No abnormality found.  EEG, NCV/EMG was reviewed with the patient at that visit.  Patient typically seen by [facility] cardiology however, initial consultation 08/2020 for second opinion.  Is also followed by Dr. Gery Pray with [facility] heart care.  She lives with and takes care of her 24 year old mother with dementia.  Does not get time to exercise outside of caregiving duties at home.  For several months she has had sharp shooting pains in her left upper chest and left arm as well as left leg.  Episodes occur for a few seconds and resolve on their own.  There is no correlation with physical exertion.  Patient has experienced severe squeezing retrosternal chest pain like she experienced at the time of her MI in 2010.  Patient compliant with cardiac regimen, has not been taking her bipolar medications recently.  Continues to smoke however down to 1 to 2 cigarettes a day.  Today, she***  ROS: ***  Studies Reviewed: Marland Kitchen        EKG 08/10/2022: Sinus rhythm 58 bpm Nonspecific Garcia wave abnormality   Echocardiogram 01/31/2022:   Normal LV systolic function with visual EF 55-60%. Left ventricle cavity is  normal in size. Normal left ventricular wall thickness. Normal global wall motion. Doppler evidence of grade I (impaired) diastolic dysfunction, normal LAP.  Moderate to severe aortic regurgitation. Sclerosis of the aortic valve. Mildly restricted aortic valve leaflets. Structurally normal mitral valve.  Mild (Grade I) mitral regurgitation. Structurally normal tricuspid valve with trace regurgitation. No evidence of pulmonary hypertension. No prior available for comparison.   Coronary angiography 06/10/2020: LM: No significant disease, LAD: Prox 10-20% stenosis. 100% occlusion at mid LAD insert previously placed stent.           Patent D1           D2 small to medium size vessel, coming off previously stented segment in LAD, with 80% ostial disease.  LCx: Luminal irregularities but no significant obstructive disease. RCA: Diffuse 30 to 40% mid disease.   Nuclear stress test 05/2020: Moderate size anteroseptal and apical defect suggestive of small to moderate ischemia in the distal LAD distribution with possible small apical scar.  Small inferior defect likely secondary to GI interference and doubtful for tiny scar. GI interference and breast attenuation were noted and could decrease specificity of the study. Normal LV systolic function. Slightly dilated LV.   30 day event monitor 07/2019: 12 sec NSVT   Risk Assessment/Calculations:   {Does this patient have ATRIAL FIBRILLATION?:[id number]} No BP recorded.  {Refresh Note OR Click here to enter BP  :1}***       Physical Exam:   VS:  LMP 08/31/2010  Wt Readings from Last 3 Encounters:  08/10/22 145 lb (65.8 kg)  01/15/22 147 lb 3.2 oz (66.8 kg)  07/10/21 149 lb (67.6 kg)    GEN: Well nourished, well developed in no acute distress NECK: No JVD; No carotid bruits CARDIAC: ***RRR, no murmurs, rubs, gallops RESPIRATORY:  Clear to auscultation without rales, wheezing or rhonchi  ABDOMEN: Soft, non-tender, non-distended EXTREMITIES:   No edema; No deformity   ASSESSMENT AND PLAN: .   1.  CAD 2.  Palpitations 3.  Moderate to severe aortic regurgitation 4.  Hypertension 5.  Nicotine dependence    {Are you ordering a CV Procedure (e.g. stress test, cath, DCCV, TEE, etc)?   Press F2        :[id number]}  Dispo: ***  Signed, Sharlene Dory, PA-C

## 2022-11-13 ENCOUNTER — Ambulatory Visit: Payer: Medicaid Other | Attending: Cardiology | Admitting: Physician Assistant

## 2022-11-13 DIAGNOSIS — I2510 Atherosclerotic heart disease of native coronary artery without angina pectoris: Secondary | ICD-10-CM

## 2022-11-13 DIAGNOSIS — F17210 Nicotine dependence, cigarettes, uncomplicated: Secondary | ICD-10-CM

## 2022-11-13 DIAGNOSIS — R002 Palpitations: Secondary | ICD-10-CM

## 2022-11-13 DIAGNOSIS — E785 Hyperlipidemia, unspecified: Secondary | ICD-10-CM

## 2022-11-13 DIAGNOSIS — I10 Essential (primary) hypertension: Secondary | ICD-10-CM

## 2022-11-13 DIAGNOSIS — I351 Nonrheumatic aortic (valve) insufficiency: Secondary | ICD-10-CM

## 2022-11-14 ENCOUNTER — Encounter: Payer: Self-pay | Admitting: Physician Assistant

## 2023-08-30 DIAGNOSIS — I10 Essential (primary) hypertension: Secondary | ICD-10-CM | POA: Diagnosis not present

## 2023-08-30 DIAGNOSIS — K219 Gastro-esophageal reflux disease without esophagitis: Secondary | ICD-10-CM | POA: Diagnosis not present

## 2023-08-30 DIAGNOSIS — Z0000 Encounter for general adult medical examination without abnormal findings: Secondary | ICD-10-CM | POA: Diagnosis not present

## 2023-08-30 DIAGNOSIS — E538 Deficiency of other specified B group vitamins: Secondary | ICD-10-CM | POA: Diagnosis not present

## 2023-08-30 DIAGNOSIS — R739 Hyperglycemia, unspecified: Secondary | ICD-10-CM | POA: Diagnosis not present

## 2023-08-30 DIAGNOSIS — I2510 Atherosclerotic heart disease of native coronary artery without angina pectoris: Secondary | ICD-10-CM | POA: Diagnosis not present

## 2023-09-12 DIAGNOSIS — M542 Cervicalgia: Secondary | ICD-10-CM | POA: Diagnosis not present

## 2023-09-12 DIAGNOSIS — M4722 Other spondylosis with radiculopathy, cervical region: Secondary | ICD-10-CM | POA: Diagnosis not present

## 2023-09-12 DIAGNOSIS — I10 Essential (primary) hypertension: Secondary | ICD-10-CM | POA: Diagnosis not present

## 2023-09-12 DIAGNOSIS — M1990 Unspecified osteoarthritis, unspecified site: Secondary | ICD-10-CM | POA: Diagnosis not present

## 2023-09-12 DIAGNOSIS — M4802 Spinal stenosis, cervical region: Secondary | ICD-10-CM | POA: Diagnosis not present

## 2023-09-12 DIAGNOSIS — M4723 Other spondylosis with radiculopathy, cervicothoracic region: Secondary | ICD-10-CM | POA: Diagnosis not present

## 2023-09-23 DIAGNOSIS — R300 Dysuria: Secondary | ICD-10-CM | POA: Diagnosis not present

## 2023-09-23 DIAGNOSIS — R519 Headache, unspecified: Secondary | ICD-10-CM | POA: Diagnosis not present

## 2023-09-23 DIAGNOSIS — N390 Urinary tract infection, site not specified: Secondary | ICD-10-CM | POA: Diagnosis not present

## 2023-09-24 DIAGNOSIS — I10 Essential (primary) hypertension: Secondary | ICD-10-CM | POA: Diagnosis not present

## 2023-10-04 DIAGNOSIS — R350 Frequency of micturition: Secondary | ICD-10-CM | POA: Diagnosis not present

## 2023-10-04 DIAGNOSIS — J441 Chronic obstructive pulmonary disease with (acute) exacerbation: Secondary | ICD-10-CM | POA: Diagnosis not present

## 2023-10-04 DIAGNOSIS — J449 Chronic obstructive pulmonary disease, unspecified: Secondary | ICD-10-CM | POA: Diagnosis not present

## 2023-10-04 DIAGNOSIS — R0602 Shortness of breath: Secondary | ICD-10-CM | POA: Diagnosis not present

## 2023-10-04 DIAGNOSIS — I10 Essential (primary) hypertension: Secondary | ICD-10-CM | POA: Diagnosis not present

## 2023-10-04 DIAGNOSIS — R062 Wheezing: Secondary | ICD-10-CM | POA: Diagnosis not present

## 2023-10-26 DIAGNOSIS — G9389 Other specified disorders of brain: Secondary | ICD-10-CM | POA: Diagnosis not present

## 2023-10-26 DIAGNOSIS — J449 Chronic obstructive pulmonary disease, unspecified: Secondary | ICD-10-CM | POA: Diagnosis not present

## 2023-10-26 DIAGNOSIS — Z881 Allergy status to other antibiotic agents status: Secondary | ICD-10-CM | POA: Diagnosis not present

## 2023-10-26 DIAGNOSIS — H538 Other visual disturbances: Secondary | ICD-10-CM | POA: Diagnosis not present

## 2023-10-26 DIAGNOSIS — I6523 Occlusion and stenosis of bilateral carotid arteries: Secondary | ICD-10-CM | POA: Diagnosis not present

## 2023-10-26 DIAGNOSIS — E785 Hyperlipidemia, unspecified: Secondary | ICD-10-CM | POA: Diagnosis not present

## 2023-10-26 DIAGNOSIS — R0981 Nasal congestion: Secondary | ICD-10-CM | POA: Diagnosis not present

## 2023-10-26 DIAGNOSIS — Z885 Allergy status to narcotic agent status: Secondary | ICD-10-CM | POA: Diagnosis not present

## 2023-10-26 DIAGNOSIS — Z882 Allergy status to sulfonamides status: Secondary | ICD-10-CM | POA: Diagnosis not present

## 2023-10-26 DIAGNOSIS — I259 Chronic ischemic heart disease, unspecified: Secondary | ICD-10-CM | POA: Diagnosis not present

## 2023-10-26 DIAGNOSIS — I10 Essential (primary) hypertension: Secondary | ICD-10-CM | POA: Diagnosis not present

## 2023-10-26 DIAGNOSIS — Z87891 Personal history of nicotine dependence: Secondary | ICD-10-CM | POA: Diagnosis not present

## 2023-10-26 DIAGNOSIS — R519 Headache, unspecified: Secondary | ICD-10-CM | POA: Diagnosis not present

## 2023-10-29 DIAGNOSIS — I10 Essential (primary) hypertension: Secondary | ICD-10-CM | POA: Diagnosis not present

## 2023-10-29 DIAGNOSIS — R519 Headache, unspecified: Secondary | ICD-10-CM | POA: Diagnosis not present

## 2023-10-29 DIAGNOSIS — J449 Chronic obstructive pulmonary disease, unspecified: Secondary | ICD-10-CM | POA: Diagnosis not present

## 2023-11-26 DIAGNOSIS — I2510 Atherosclerotic heart disease of native coronary artery without angina pectoris: Secondary | ICD-10-CM | POA: Diagnosis not present

## 2023-11-26 DIAGNOSIS — J449 Chronic obstructive pulmonary disease, unspecified: Secondary | ICD-10-CM | POA: Diagnosis not present

## 2023-11-26 DIAGNOSIS — R350 Frequency of micturition: Secondary | ICD-10-CM | POA: Diagnosis not present

## 2023-11-26 DIAGNOSIS — I10 Essential (primary) hypertension: Secondary | ICD-10-CM | POA: Diagnosis not present
